# Patient Record
Sex: Male | Born: 1935 | Race: White | Hispanic: No | Marital: Married | Smoking: Former smoker
Health system: Southern US, Community
[De-identification: ages and names within clinical notes are randomized; demographics above are authoritative.]

## PROBLEM LIST (undated history)

## (undated) DIAGNOSIS — E119 Type 2 diabetes mellitus without complications: Secondary | ICD-10-CM

## (undated) DIAGNOSIS — I4891 Unspecified atrial fibrillation: Secondary | ICD-10-CM

## (undated) DIAGNOSIS — G5603 Carpal tunnel syndrome, bilateral upper limbs: Secondary | ICD-10-CM

## (undated) DIAGNOSIS — Z8719 Personal history of other diseases of the digestive system: Secondary | ICD-10-CM

## (undated) DIAGNOSIS — G4733 Obstructive sleep apnea (adult) (pediatric): Secondary | ICD-10-CM

## (undated) DIAGNOSIS — J309 Allergic rhinitis, unspecified: Secondary | ICD-10-CM

## (undated) DIAGNOSIS — R269 Unspecified abnormalities of gait and mobility: Secondary | ICD-10-CM

## (undated) DIAGNOSIS — E669 Obesity, unspecified: Secondary | ICD-10-CM

## (undated) DIAGNOSIS — K8020 Calculus of gallbladder without cholecystitis without obstruction: Secondary | ICD-10-CM

## (undated) DIAGNOSIS — I10 Essential (primary) hypertension: Secondary | ICD-10-CM

## (undated) DIAGNOSIS — G629 Polyneuropathy, unspecified: Secondary | ICD-10-CM

## (undated) DIAGNOSIS — G20 Parkinson's disease: Secondary | ICD-10-CM

## (undated) DIAGNOSIS — E1142 Type 2 diabetes mellitus with diabetic polyneuropathy: Principal | ICD-10-CM

## (undated) DIAGNOSIS — E039 Hypothyroidism, unspecified: Secondary | ICD-10-CM

## (undated) DIAGNOSIS — E1149 Type 2 diabetes mellitus with other diabetic neurological complication: Secondary | ICD-10-CM

## (undated) DIAGNOSIS — K222 Esophageal obstruction: Secondary | ICD-10-CM

## (undated) DIAGNOSIS — C4490 Unspecified malignant neoplasm of skin, unspecified: Secondary | ICD-10-CM

## (undated) HISTORY — PX: CATARACT EXTRACTION: SUR2

## (undated) HISTORY — PX: TONSILLECTOMY: SUR1361

## (undated) HISTORY — DX: Esophageal obstruction: K22.2

## (undated) HISTORY — DX: Type 2 diabetes mellitus with other diabetic neurological complication: E11.49

## (undated) HISTORY — DX: Essential (primary) hypertension: I10

## (undated) HISTORY — DX: Unspecified malignant neoplasm of skin, unspecified: C44.90

## (undated) HISTORY — DX: Type 2 diabetes mellitus without complications: E11.9

## (undated) HISTORY — PX: TOTAL KNEE ARTHROPLASTY: SHX125

## (undated) HISTORY — DX: Obstructive sleep apnea (adult) (pediatric): G47.33

## (undated) HISTORY — DX: Parkinson's disease: G20

## (undated) HISTORY — DX: Type 2 diabetes mellitus with diabetic polyneuropathy: E11.42

## (undated) HISTORY — DX: Unspecified abnormalities of gait and mobility: R26.9

## (undated) HISTORY — PX: OTHER SURGICAL HISTORY: SHX169

## (undated) HISTORY — DX: Allergic rhinitis, unspecified: J30.9

---

## 1997-11-10 ENCOUNTER — Ambulatory Visit (HOSPITAL_COMMUNITY): Admission: RE | Admit: 1997-11-10 | Discharge: 1997-11-10 | Payer: Self-pay | Admitting: Family Medicine

## 1997-11-17 ENCOUNTER — Ambulatory Visit (HOSPITAL_COMMUNITY): Admission: RE | Admit: 1997-11-17 | Discharge: 1997-11-17 | Payer: Self-pay | Admitting: Cardiology

## 1998-02-09 ENCOUNTER — Encounter: Admission: RE | Admit: 1998-02-09 | Discharge: 1998-02-09 | Payer: Self-pay | Admitting: Infectious Diseases

## 1998-02-24 ENCOUNTER — Encounter: Admission: RE | Admit: 1998-02-24 | Discharge: 1998-02-24 | Payer: Self-pay | Admitting: Infectious Diseases

## 1998-03-17 ENCOUNTER — Encounter: Admission: RE | Admit: 1998-03-17 | Discharge: 1998-03-17 | Payer: Self-pay | Admitting: Infectious Diseases

## 1998-04-27 ENCOUNTER — Encounter: Admission: RE | Admit: 1998-04-27 | Discharge: 1998-04-27 | Payer: Self-pay | Admitting: Infectious Diseases

## 1998-04-27 ENCOUNTER — Ambulatory Visit (HOSPITAL_COMMUNITY): Admission: RE | Admit: 1998-04-27 | Discharge: 1998-04-27 | Payer: Self-pay | Admitting: Infectious Diseases

## 1998-06-21 ENCOUNTER — Encounter: Admission: RE | Admit: 1998-06-21 | Discharge: 1998-06-21 | Payer: Self-pay | Admitting: Infectious Diseases

## 1998-07-04 ENCOUNTER — Encounter: Admission: RE | Admit: 1998-07-04 | Discharge: 1998-07-04 | Payer: Self-pay | Admitting: Infectious Diseases

## 1998-08-03 ENCOUNTER — Encounter: Admission: RE | Admit: 1998-08-03 | Discharge: 1998-08-03 | Payer: Self-pay | Admitting: Infectious Diseases

## 1998-08-31 ENCOUNTER — Encounter: Admission: RE | Admit: 1998-08-31 | Discharge: 1998-08-31 | Payer: Self-pay | Admitting: Infectious Diseases

## 1998-09-06 ENCOUNTER — Encounter: Payer: Self-pay | Admitting: Emergency Medicine

## 1998-09-06 ENCOUNTER — Inpatient Hospital Stay: Admission: EM | Admit: 1998-09-06 | Discharge: 1998-09-08 | Payer: Self-pay | Admitting: Emergency Medicine

## 1998-09-07 ENCOUNTER — Encounter: Payer: Self-pay | Admitting: Gastroenterology

## 1998-11-11 ENCOUNTER — Ambulatory Visit: Admission: RE | Admit: 1998-11-11 | Discharge: 1998-11-11 | Payer: Self-pay | Admitting: Internal Medicine

## 1999-08-31 ENCOUNTER — Inpatient Hospital Stay (HOSPITAL_COMMUNITY): Admission: RE | Admit: 1999-08-31 | Discharge: 1999-09-04 | Payer: Self-pay | Admitting: Orthopedic Surgery

## 2000-07-17 ENCOUNTER — Ambulatory Visit (HOSPITAL_COMMUNITY): Admission: RE | Admit: 2000-07-17 | Discharge: 2000-07-17 | Payer: Self-pay | Admitting: Gastroenterology

## 2000-07-17 ENCOUNTER — Encounter (INDEPENDENT_AMBULATORY_CARE_PROVIDER_SITE_OTHER): Payer: Self-pay | Admitting: Specialist

## 2000-12-24 ENCOUNTER — Ambulatory Visit (HOSPITAL_COMMUNITY): Admission: RE | Admit: 2000-12-24 | Discharge: 2000-12-24 | Payer: Self-pay | Admitting: Family Medicine

## 2003-09-15 ENCOUNTER — Ambulatory Visit (HOSPITAL_BASED_OUTPATIENT_CLINIC_OR_DEPARTMENT_OTHER): Admission: RE | Admit: 2003-09-15 | Discharge: 2003-09-15 | Payer: Self-pay | Admitting: Internal Medicine

## 2005-03-13 ENCOUNTER — Ambulatory Visit: Payer: Self-pay | Admitting: Internal Medicine

## 2006-04-02 ENCOUNTER — Ambulatory Visit: Payer: Self-pay | Admitting: Internal Medicine

## 2006-06-17 ENCOUNTER — Encounter: Admission: RE | Admit: 2006-06-17 | Discharge: 2006-09-15 | Payer: Self-pay | Admitting: Family Medicine

## 2006-07-24 ENCOUNTER — Encounter: Admission: RE | Admit: 2006-07-24 | Discharge: 2006-10-22 | Payer: Self-pay | Admitting: Family Medicine

## 2006-12-11 ENCOUNTER — Encounter: Admission: RE | Admit: 2006-12-11 | Discharge: 2006-12-11 | Payer: Self-pay | Admitting: Family Medicine

## 2007-04-01 ENCOUNTER — Ambulatory Visit: Payer: Self-pay | Admitting: Internal Medicine

## 2008-03-29 DIAGNOSIS — E1149 Type 2 diabetes mellitus with other diabetic neurological complication: Secondary | ICD-10-CM | POA: Insufficient documentation

## 2008-03-29 DIAGNOSIS — E119 Type 2 diabetes mellitus without complications: Secondary | ICD-10-CM

## 2008-03-29 DIAGNOSIS — G4733 Obstructive sleep apnea (adult) (pediatric): Secondary | ICD-10-CM

## 2008-03-29 DIAGNOSIS — I10 Essential (primary) hypertension: Secondary | ICD-10-CM | POA: Insufficient documentation

## 2008-03-29 DIAGNOSIS — J309 Allergic rhinitis, unspecified: Secondary | ICD-10-CM | POA: Insufficient documentation

## 2008-03-30 ENCOUNTER — Ambulatory Visit: Payer: Self-pay | Admitting: Internal Medicine

## 2008-07-29 ENCOUNTER — Telehealth: Payer: Self-pay | Admitting: Internal Medicine

## 2009-03-30 ENCOUNTER — Ambulatory Visit: Payer: Self-pay | Admitting: Internal Medicine

## 2009-06-05 ENCOUNTER — Telehealth: Payer: Self-pay | Admitting: Internal Medicine

## 2009-06-05 ENCOUNTER — Encounter: Payer: Self-pay | Admitting: Internal Medicine

## 2009-07-20 ENCOUNTER — Encounter: Payer: Self-pay | Admitting: Internal Medicine

## 2010-02-23 ENCOUNTER — Encounter: Payer: Self-pay | Admitting: Internal Medicine

## 2010-03-30 ENCOUNTER — Ambulatory Visit: Payer: Self-pay | Admitting: Internal Medicine
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
## 2010-12-04 NOTE — Assessment & Plan Note (Signed)
[facility]                             PULMONARY OFFICE NOTE   Joshua Glass, Joshua Glass                        MRN:          [id number]  DATE:04/01/2007                            DOB:          06-28-1933    PULMONARY FOLLOW-UP:   PROBLEMS:  1. Obstructive sleep apnea.  2. Hypertension.  3. Allergic rhinitis.  4. Diabetes/neuropathy.   HISTORY:  CPAP 17 CWP is comfortable.  He likes his new humidifier.  Has  been losing weight as part of his diabetes management program and  understands this helps his sleep apnea as well.  Sneezes in winter  dryness, otherwise nasal airway works fairly well.   MEDICATIONS:  1. CPAP 17.  2. Cozaar 100 mg.  3. Omega-3.  4. Prevacid 30 mg every other day  5. Maxzide 75/50 mg x1/2.  6. Potassium 20 mcg x1/2.  7. Baby aspirin.  8. Crestor.   No medication allergy.   OBJECTIVE:  Weight 233 pounds, BP 144/80, pulse 65, room air saturation  98%.  He is obese, cheerful.  No evident distress.  His nasal airway seems clear.  There are no pressure marks from his  mask.  LUNGS:  Clear.  Pulse regular.   IMPRESSION:  Good control of sleep apnea.  He would do better if he  could lose weight, and this was reviewed again.  We do not have changes  to make.  We are scheduling return in one year, earlier p.r.n.     Clinton D. Maple Hudson, MD, [facility], FACP  Electronically Signed   CDY/MedQ  DD: 04/05/2007  DT: 04/06/2007  Job #: [id number]   cc:   [name], M.D.

## 2010-12-07 NOTE — Assessment & Plan Note (Signed)
[facility]                               PULMONARY OFFICE NOTE   DAIWIK, BUFFALO                        MRN:          [id number]  DATE:04/02/2006                            DOB:          February 04, 1933    PROBLEM:  1. Obstructive sleep apnea.  2. Hypertension.  3. Allergic rhinitis.   HISTORY:  This gentleman is now 4 and returns for one-year follow-up of his  sleep apnea. He feels very comfortable and stable on CPAP at 17 CWP.  He  describes using a sleep number bed.  He has had some seasonal sneezing and  mild nasal congestion, which makes a little difference in CPAP comfort.  He  is trying to lose some weight.  They have put an electronic air cleaner in  the bedroom.   MEDICATIONS:  1. CPAP at 17 CWP.  2. Cozaar 100 mg.  3. Prevacid 30 mg every other day.  4. Maxzide 75/50, one-half daily.  5. Potassium 20.  6. Aspirin 81 mg.  7. Metformin 500 mg.   NO MEDICATION ALLERGY.   OBJECTIVE:  VITAL SIGNS:  Over weight at 248 pounds.  Temperature 98.8, BP  156/90, pulse regular 79, room air saturation 97%.  CONSTITUTIONAL:  He is quite alert.  HEENT:  There are no pressure marks on his face from the CPAP.  Turbinates  are somewhat edematous with watery secretion.  Conjunctivae are clear.  Pharynx is clear.  NECK:  There is no stridor or thyromegaly.  LUNGS:  Clear to P and A.  HEART:  Sounds regular without murmur.   IMPRESSION:  1. Obstructive sleep apnea is adequately controlled on CPAP at 17 CWP.  2. Mild seasonal exacerbation of allergic rhinitis.   PLAN:  1. Continue CPAP at 17 CWP.  2. Weight loss.  3. Discussing options, I suggested he try Nasalcrom nasal spray, which is      over-the-counter.  4. Schedule return 1 year, earlier p.r.n.                                   Clinton D. Maple Hudson, MD, [facility], FACP   CDY/MedQ  DD:  04/05/2006  DT:  04/06/2006  Job #:  [id number]   cc:   [name], M.D.

## 2011-01-31 ENCOUNTER — Other Ambulatory Visit: Payer: Self-pay | Admitting: Dermatology

## 2011-03-28 ENCOUNTER — Encounter: Payer: Self-pay | Admitting: Internal Medicine

## 2011-03-29 ENCOUNTER — Ambulatory Visit (INDEPENDENT_AMBULATORY_CARE_PROVIDER_SITE_OTHER): Payer: No Typology Code available for payment source | Admitting: Internal Medicine

## 2011-03-29 ENCOUNTER — Other Ambulatory Visit (INDEPENDENT_AMBULATORY_CARE_PROVIDER_SITE_OTHER): Payer: No Typology Code available for payment source

## 2011-03-29 ENCOUNTER — Encounter: Payer: Self-pay | Admitting: Internal Medicine

## 2011-03-29 VITALS — BP 130/70 | HR 82 | Ht 70.0 in | Wt 255.6 lb

## 2011-03-29 DIAGNOSIS — Z23 Encounter for immunization: Secondary | ICD-10-CM

## 2011-03-29 DIAGNOSIS — G4733 Obstructive sleep apnea (adult) (pediatric): Secondary | ICD-10-CM

## 2011-03-29 DIAGNOSIS — E039 Hypothyroidism, unspecified: Secondary | ICD-10-CM

## 2011-03-29 LAB — TSH: TSH: 2.61 u[IU]/mL (ref 0.35–5.50)

## 2011-03-29 NOTE — Progress Notes (Signed)
Subjective:    Patient ID: Joshua Glass, male    DOB: [birth year], 77 y.o.   MRN: [id number]  HPI 03/29/11- 77 year old male former smoker followed for obstructive sleep apnea, allergic rhinitis, complicated by DM, HBP. Last here -03/30/10- For the last 4-5 months he has felt no energy. He asks if this is age. He uses CPAP all night every night, and sleep quality feels the same to him, but doesn't know if he snores through. Avoids caffeine for heart rhythm. He is no more likely to doze off during the day that he was at last visit when he thought he was going well..  Denies heart or known thyroid problems. To see his PCP, Dr Marina Goodell, in October.  Flu vax talk.  Review of Systems Constitutional:   No-   weight loss, night sweats, fevers, chills,  + fatigue, lassitude. HEENT:   No-  headaches, difficulty swallowing, tooth/dental problems, sore throat,       No-  sneezing, itching, ear ache, nasal congestion, post nasal drip,  CV:  No-   chest pain, orthopnea, PND, swelling in lower extremities, anasarca, dizziness, palpitations Resp: No-   shortness of breath with exertion or at rest.              No-   productive cough,  No non-productive cough,  No-  coughing up of blood.              No-   change in color of mucus.  No- wheezing.   Skin: No-   rash or lesions. GI:  No-   heartburn, indigestion, abdominal pain, nausea, vomiting, diarrhea,                 change in bowel habits, loss of appetite GU: No-   dysuria, change in color of urine, no urgency or frequency.  No- flank pain. MS:  No-   joint pain or swelling.  No- decreased range of motion.  No- back pain. Neuro- grossly normal to observation, Or:  Psych:  No- change in mood or affect. No depression or anxiety.  No memory loss.      Objective:   Physical Exam General- Alert, Oriented, Affect-appropriate, Distress- none acute  Obese, passive  Skin- rash-none, lesions- none, excoriation- none Lymphadenopathy- none Head- atraumatic       Eyes- Gross vision intact, PERRLA, conjunctivae clear secretions            Ears- Hearing, canals normal            Nose- Clear, No- Septal dev, mucus, polyps, erosion, perforation             Throat- Mallampati III , mucosa clear , drainage- none, tonsils- atrophic Neck- flexible , trachea midline, no stridor , thyroid nl, carotid no bruit Chest - symmetrical excursion , unlabored           Heart/CV- RRR , no murmur , no gallop  , no rub, nl s1 s2                           - JVD- none , edema- none, stasis changes- none, varices- none           Lung- clear to P&A, wheeze- none, cough- none , dullness-none, rub- none           Chest wall-  Abd- tender-no, distended-no, bowel sounds-present, HSM- no Br/ Gen/ Rectal- Not done, not indicated Extrem- cyanosis- none, clubbing,  none, atrophy- none, strength- nl Neuro- grossly intact to observation         Assessment & Plan:

## 2011-03-29 NOTE — Patient Instructions (Signed)
Order- [facility]-  Increase CPAP to 19              Lab-   TSH    Dx hypothyroid               Flu vax

## 2011-03-29 NOTE — Assessment & Plan Note (Signed)
Complains of lack of energy, which may not be sleepiness.  Plan- try [facility] increase CPAP to 17>19 Check TSH in advance of visit to Dr Marina Goodell

## 2011-04-02 ENCOUNTER — Telehealth: Payer: Self-pay | Admitting: Internal Medicine

## 2011-04-02 NOTE — Telephone Encounter (Signed)
Spoke to [email or web address]  she had the office call pt and set his cpap on 19cm today before he goes o-u-t

## 2011-04-09 ENCOUNTER — Telehealth: Payer: Self-pay | Admitting: Internal Medicine

## 2011-04-09 NOTE — Telephone Encounter (Signed)
Pt requesting thyroid results. Results provided. Nothing further needed. Carron Curie, CMA

## 2011-08-06 ENCOUNTER — Telehealth: Payer: Self-pay | Admitting: Internal Medicine

## 2011-08-06 DIAGNOSIS — G4733 Obstructive sleep apnea (adult) (pediatric): Secondary | ICD-10-CM

## 2011-08-06 NOTE — Telephone Encounter (Signed)
Called and spoke with pt. He states feels that the pressure set on 19 is too high. He states that he is not able to get much sleep with this and feels that he is smothering. States that he has tried several masks with the new pressure and all of them cause air to leak, and he has to have the mask on so tight that it has caused a sore on his nose. He would like to know if okay with CDY to have to pressure lowered back down to 17. Pt aware CDY out of the office again until tomorrow. States okay to wait until then. Please advise, thanks!

## 2011-08-07 NOTE — Telephone Encounter (Signed)
Per CY-okay to put order in for reduce CPAP to 17 through his DME company

## 2011-08-07 NOTE — Telephone Encounter (Signed)
Addended by: Reynaldo Minium C on: 08/07/2011 11:47 AM   Modules accepted: Orders

## 2011-08-07 NOTE — Telephone Encounter (Signed)
I have placed order

## 2011-08-08 ENCOUNTER — Telehealth: Payer: Self-pay | Admitting: Internal Medicine

## 2011-08-08 NOTE — Telephone Encounter (Signed)
kayla w/ [facility] called to get an order to increase cpap pressure to 17. Her # is [phone]. Hazel Sams

## 2011-08-08 NOTE — Telephone Encounter (Signed)
Spoke to [email or web address]  this was taken care of today

## 2011-08-08 NOTE — Telephone Encounter (Signed)
Spoke with Joshua Glass at [facility] and she is going to have someone in resp dept to call back.  Informed her that order to decrease CPAP pressure was sent over yesterday.  She will have someone call back to verify that order was received.

## 2011-10-05 ENCOUNTER — Emergency Department (HOSPITAL_COMMUNITY): Payer: Medicare Other

## 2011-10-05 ENCOUNTER — Other Ambulatory Visit: Payer: Self-pay

## 2011-10-05 ENCOUNTER — Encounter (HOSPITAL_COMMUNITY): Payer: Self-pay | Admitting: Physical Medicine and Rehabilitation

## 2011-10-05 ENCOUNTER — Inpatient Hospital Stay (HOSPITAL_COMMUNITY)
Admission: EM | Admit: 2011-10-05 | Discharge: 2011-10-09 | DRG: 086 | Disposition: A | Discharge status: Rehabilitation Facility | Payer: Medicare Other | Attending: Family Medicine | Admitting: Family Medicine

## 2011-10-05 DIAGNOSIS — W19XXXA Unspecified fall, initial encounter: Secondary | ICD-10-CM

## 2011-10-05 DIAGNOSIS — I10 Essential (primary) hypertension: Secondary | ICD-10-CM | POA: Diagnosis present

## 2011-10-05 DIAGNOSIS — S0990XA Unspecified injury of head, initial encounter: Secondary | ICD-10-CM

## 2011-10-05 DIAGNOSIS — M5030 Other cervical disc degeneration, unspecified cervical region: Secondary | ICD-10-CM | POA: Diagnosis present

## 2011-10-05 DIAGNOSIS — G909 Disorder of the autonomic nervous system, unspecified: Secondary | ICD-10-CM | POA: Diagnosis present

## 2011-10-05 DIAGNOSIS — Z7982 Long term (current) use of aspirin: Secondary | ICD-10-CM

## 2011-10-05 DIAGNOSIS — E119 Type 2 diabetes mellitus without complications: Secondary | ICD-10-CM

## 2011-10-05 DIAGNOSIS — R55 Syncope and collapse: Secondary | ICD-10-CM | POA: Diagnosis present

## 2011-10-05 DIAGNOSIS — J309 Allergic rhinitis, unspecified: Secondary | ICD-10-CM | POA: Diagnosis present

## 2011-10-05 DIAGNOSIS — G4733 Obstructive sleep apnea (adult) (pediatric): Secondary | ICD-10-CM | POA: Diagnosis present

## 2011-10-05 DIAGNOSIS — S0003XA Contusion of scalp, initial encounter: Secondary | ICD-10-CM | POA: Diagnosis present

## 2011-10-05 DIAGNOSIS — S065XAA Traumatic subdural hemorrhage with loss of consciousness status unknown, initial encounter: Principal | ICD-10-CM | POA: Diagnosis present

## 2011-10-05 DIAGNOSIS — E1149 Type 2 diabetes mellitus with other diabetic neurological complication: Secondary | ICD-10-CM | POA: Diagnosis present

## 2011-10-05 DIAGNOSIS — Y92009 Unspecified place in unspecified non-institutional (private) residence as the place of occurrence of the external cause: Secondary | ICD-10-CM

## 2011-10-05 DIAGNOSIS — Z794 Long term (current) use of insulin: Secondary | ICD-10-CM

## 2011-10-05 DIAGNOSIS — Z79899 Other long term (current) drug therapy: Secondary | ICD-10-CM

## 2011-10-05 DIAGNOSIS — S0083XA Contusion of other part of head, initial encounter: Secondary | ICD-10-CM | POA: Diagnosis present

## 2011-10-05 DIAGNOSIS — M25519 Pain in unspecified shoulder: Secondary | ICD-10-CM | POA: Diagnosis present

## 2011-10-05 DIAGNOSIS — S065X9A Traumatic subdural hemorrhage with loss of consciousness of unspecified duration, initial encounter: Secondary | ICD-10-CM

## 2011-10-05 HISTORY — DX: Carpal tunnel syndrome, bilateral upper limbs: G56.03

## 2011-10-05 HISTORY — DX: Obesity, unspecified: E66.9

## 2011-10-05 HISTORY — DX: Polyneuropathy, unspecified: G62.9

## 2011-10-05 LAB — CBC
HCT: 41.3 % (ref 39.0–52.0)
Hemoglobin: 14.6 g/dL (ref 13.0–17.0)
MCH: 30 pg (ref 26.0–34.0)
MCHC: 35.4 g/dL (ref 30.0–36.0)
MCV: 85 fL (ref 78.0–100.0)
RDW: 14.2 % (ref 11.5–15.5)

## 2011-10-05 LAB — POCT I-STAT, CHEM 8
BUN: 13 mg/dL (ref 6–23)
Creatinine, Ser: 1 mg/dL (ref 0.50–1.35)
Glucose, Bld: 244 mg/dL — ABNORMAL HIGH (ref 70–99)
Sodium: 138 mEq/L (ref 135–145)
TCO2: 24 mmol/L (ref 0–100)

## 2011-10-05 LAB — URINALYSIS, ROUTINE W REFLEX MICROSCOPIC
Bilirubin Urine: NEGATIVE
Hgb urine dipstick: NEGATIVE
Ketones, ur: NEGATIVE mg/dL
Protein, ur: NEGATIVE mg/dL
Specific Gravity, Urine: 1.014 (ref 1.005–1.030)
Urobilinogen, UA: 1 mg/dL (ref 0.0–1.0)

## 2011-10-05 LAB — DIFFERENTIAL
Basophils Relative: 1 % (ref 0–1)
Eosinophils Absolute: 0.3 10*3/uL (ref 0.0–0.7)
Eosinophils Relative: 3 % (ref 0–5)
Monocytes Absolute: 0.9 10*3/uL (ref 0.1–1.0)
Monocytes Relative: 7 % (ref 3–12)

## 2011-10-05 LAB — HEMOGLOBIN A1C
Hgb A1c MFr Bld: 8.3 % — ABNORMAL HIGH (ref <5.7)
Mean Plasma Glucose: 192 mg/dL — ABNORMAL HIGH (ref <117)

## 2011-10-05 MED ORDER — LOSARTAN POTASSIUM 50 MG PO TABS
50.0000 mg | ORAL_TABLET | Freq: Two times a day (BID) | ORAL | Status: DC
  Administered 2011-10-05 – 2011-10-09 (×8): 50 mg via ORAL
  Filled 2011-10-05 (×9): qty 1

## 2011-10-05 MED ORDER — DOCUSATE SODIUM 100 MG PO CAPS
100.0000 mg | ORAL_CAPSULE | Freq: Two times a day (BID) | ORAL | Status: DC
  Administered 2011-10-06 – 2011-10-09 (×7): 100 mg via ORAL
  Filled 2011-10-05 (×9): qty 1

## 2011-10-05 MED ORDER — ONDANSETRON HCL 4 MG/2ML IJ SOLN
4.0000 mg | Freq: Once | INTRAMUSCULAR | Status: AC
  Administered 2011-10-05: 4 mg via INTRAVENOUS
  Filled 2011-10-05: qty 2

## 2011-10-05 MED ORDER — ACETAMINOPHEN 650 MG RE SUPP: 650.0000 mg | Freq: Four times a day (QID) | RECTAL | Status: DC | PRN

## 2011-10-05 MED ORDER — SENNOSIDES-DOCUSATE SODIUM 8.6-50 MG PO TABS
1.0000 | ORAL_TABLET | Freq: Every evening | ORAL | Status: DC | PRN
  Administered 2011-10-05: 1 via ORAL
  Filled 2011-10-05: qty 1

## 2011-10-05 MED ORDER — METOPROLOL TARTRATE 1 MG/ML IV SOLN: 5.0000 mg | Freq: Four times a day (QID) | INTRAVENOUS | Status: DC | PRN

## 2011-10-05 MED ORDER — OXYCODONE HCL 5 MG PO TABS
5.0000 mg | ORAL_TABLET | ORAL | Status: DC | PRN
  Administered 2011-10-06 – 2011-10-08 (×9): 5 mg via ORAL
  Filled 2011-10-05 (×10): qty 1

## 2011-10-05 MED ORDER — ACETAMINOPHEN 325 MG PO TABS
650.0000 mg | ORAL_TABLET | Freq: Four times a day (QID) | ORAL | Status: DC | PRN
  Administered 2011-10-05 – 2011-10-08 (×9): 650 mg via ORAL
  Filled 2011-10-05 (×9): qty 2

## 2011-10-05 MED ORDER — SODIUM CHLORIDE 0.9 % IV SOLN: INTRAVENOUS | Status: DC

## 2011-10-05 MED ORDER — ONDANSETRON HCL 4 MG PO TABS: 4.0000 mg | ORAL_TABLET | Freq: Four times a day (QID) | ORAL | Status: DC | PRN

## 2011-10-05 MED ORDER — PANTOPRAZOLE SODIUM 40 MG PO TBEC
40.0000 mg | DELAYED_RELEASE_TABLET | Freq: Every day | ORAL | Status: DC
  Administered 2011-10-06 – 2011-10-09 (×4): 40 mg via ORAL
  Filled 2011-10-05 (×4): qty 1

## 2011-10-05 MED ORDER — MORPHINE SULFATE 2 MG/ML IJ SOLN
2.0000 mg | INTRAMUSCULAR | Status: DC | PRN
  Administered 2011-10-06 (×2): 1 mg via INTRAVENOUS
  Filled 2011-10-05 (×2): qty 1

## 2011-10-05 MED ORDER — INSULIN ASPART 100 UNIT/ML ~~LOC~~ SOLN
4.0000 [IU] | Freq: Three times a day (TID) | SUBCUTANEOUS | Status: DC
  Administered 2011-10-06 – 2011-10-09 (×11): 4 [IU] via SUBCUTANEOUS

## 2011-10-05 MED ORDER — INSULIN ASPART 100 UNIT/ML ~~LOC~~ SOLN
0.0000 [IU] | Freq: Every day | SUBCUTANEOUS | Status: DC
  Administered 2011-10-05 – 2011-10-08 (×4): 2 [IU] via SUBCUTANEOUS

## 2011-10-05 MED ORDER — ALUM & MAG HYDROXIDE-SIMETH 200-200-20 MG/5ML PO SUSP: 30.0000 mL | Freq: Four times a day (QID) | ORAL | Status: DC | PRN

## 2011-10-05 MED ORDER — SODIUM CHLORIDE 0.9 % IJ SOLN
3.0000 mL | Freq: Two times a day (BID) | INTRAMUSCULAR | Status: DC
  Administered 2011-10-05 – 2011-10-09 (×6): 3 mL via INTRAVENOUS

## 2011-10-05 MED ORDER — INSULIN GLARGINE 100 UNIT/ML ~~LOC~~ SOLN: 20.0000 [IU] | Freq: Every day | SUBCUTANEOUS | Status: DC

## 2011-10-05 MED ORDER — ONDANSETRON HCL 4 MG/2ML IJ SOLN: 4.0000 mg | Freq: Four times a day (QID) | INTRAMUSCULAR | Status: DC | PRN

## 2011-10-05 MED ORDER — INSULIN ASPART 100 UNIT/ML ~~LOC~~ SOLN
0.0000 [IU] | Freq: Three times a day (TID) | SUBCUTANEOUS | Status: DC
  Administered 2011-10-06 – 2011-10-07 (×4): 3 [IU] via SUBCUTANEOUS
  Administered 2011-10-07: 5 [IU] via SUBCUTANEOUS
  Administered 2011-10-07: 3 [IU] via SUBCUTANEOUS
  Administered 2011-10-08: 2 [IU] via SUBCUTANEOUS
  Administered 2011-10-08 (×2): 3 [IU] via SUBCUTANEOUS
  Administered 2011-10-09: 2 [IU] via SUBCUTANEOUS
  Administered 2011-10-09: 5 [IU] via SUBCUTANEOUS

## 2011-10-05 MED ORDER — SODIUM CHLORIDE 0.9 % IV SOLN: INTRAVENOUS | Status: AC

## 2011-10-05 NOTE — ED Notes (Signed)
Report given to Zina, Charity fundraiser. Pt moved to exam room 27.

## 2011-10-05 NOTE — ED Notes (Signed)
Pt presents to department via [facility] for evaluation of syncopal episode and fall. Was found by wife this morning laying on floor. States he fell down several concrete stairs. Contusion to posterior scalp. No obvious deformities noted. History of neuropathy per wife, states he has trouble walking sometimes. c-collar and LSB per EMS. He is conscious alert and oriented x4 upon arrival, but states he does not remember falling. CBG 232. 20g R forearm.
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
## 2011-10-05 NOTE — ED Notes (Signed)
c-collar removed per Dr. Rubin Payor. Neurosurgery to consult.

## 2011-10-05 NOTE — ED Notes (Signed)
Admitting at the bedside to consult. Vital signs stable. No signs of distress at the time. Pt remains alert and oriented x4.
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
## 2011-10-05 NOTE — ED Notes (Signed)
Pt resting quietly at the time. No signs of distress. Vital signs stable. Family at bedside.

## 2011-10-05 NOTE — ED Notes (Signed)
Assisted pt with urinal, however pt unable to urinate at this time.  Told pt I would check back with him shortly.
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
## 2011-10-06 ENCOUNTER — Inpatient Hospital Stay (HOSPITAL_COMMUNITY): Payer: Medicare Other

## 2011-10-06 DIAGNOSIS — I369 Nonrheumatic tricuspid valve disorder, unspecified: Secondary | ICD-10-CM

## 2011-10-06 LAB — BASIC METABOLIC PANEL
BUN: 13 mg/dL (ref 6–23)
CO2: 26 mEq/L (ref 19–32)
Chloride: 98 mEq/L (ref 96–112)
Creatinine, Ser: 0.89 mg/dL (ref 0.50–1.35)
Glucose, Bld: 207 mg/dL — ABNORMAL HIGH (ref 70–99)

## 2011-10-06 LAB — CBC
HCT: 38.4 % — ABNORMAL LOW (ref 39.0–52.0)
MCHC: 34.1 g/dL (ref 30.0–36.0)
MCV: 85 fL (ref 78.0–100.0)
RDW: 14.1 % (ref 11.5–15.5)
WBC: 11.7 10*3/uL — ABNORMAL HIGH (ref 4.0–10.5)

## 2011-10-06 LAB — GLUCOSE, CAPILLARY
Glucose-Capillary: 180 mg/dL — ABNORMAL HIGH (ref 70–99)
Glucose-Capillary: 173 mg/dL — ABNORMAL HIGH (ref 70–99)

## 2011-10-06 MED ORDER — INSULIN DETEMIR 100 UNIT/ML ~~LOC~~ SOLN
25.0000 [IU] | Freq: Every day | SUBCUTANEOUS | Status: DC
  Administered 2011-10-06 – 2011-10-09 (×4): 25 [IU] via SUBCUTANEOUS
  Filled 2011-10-06: qty 3

## 2011-10-06 NOTE — Progress Notes (Signed)
  Echocardiogram 2D Echocardiogram has been performed.  Joshua Glass 10/06/2011, 5:20 PM
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
## 2011-10-06 NOTE — Progress Notes (Signed)
Subjective: "Sore all over" -Neck pain  Objective: Vital signs in last 24 hours: Temp:  [97.2 F (36.2 C)-98.4 F (36.9 C)] 98.4 F (36.9 C) (03/17 1415) Pulse Rate:  [69-95] 73  (03/17 1415) Resp:  [18-20] 18  (03/17 1415) BP: (121-196)/(69-93) 174/81 mmHg (03/17 1415) SpO2:  [94 %-97 %] 94 % (03/17 1415) FiO2 (%):  [2 %] 2 % (03/17 0600) Weight change:  Last BM Date: 10/05/11  Intake/Output from previous day: 03/16 0701 - 03/17 0700 In: 1370 [P.O.:120; I.V.:1250] Out: -  Total I/O In: 240 [P.O.:240] Out: -    Physical Exam: General: Alert, awake, oriented x3, in no acute distress. HEENT: No bruits, no goiter. Heart: Regular rate and rhythm, without murmurs, rubs, gallops. Lungs: Clear to auscultation bilaterally. Abdomen: Soft, nontender, nondistended, positive bowel sounds. Extremities: No clubbing cyanosis or edema with positive pedal pulses. Neuro: Grossly intact, nonfocal.  Lab Results: Basic Metabolic Panel:  Basename 10/06/11 0644 10/05/11 1737 10/05/11 1104  NA 135 -- 138  K 3.5 -- 3.4*  CL 98 -- 100  CO2 26 -- --  GLUCOSE 207* -- 244*  BUN 13 -- 13  CREATININE 0.89 -- 1.00  CALCIUM 8.9 -- --  MG -- 1.7 --  PHOS -- -- --   CBC:  Basename 10/06/11 0644 10/05/11 1104 10/05/11 1045  WBC 11.7* -- 13.0*  NEUTROABS -- -- 7.9*  HGB 13.1 15.0 --  HCT 38.4* 44.0 --  MCV 85.0 -- 85.0  PLT 259 -- 253   Cardiac Enzymes:  Basename 10/05/11 1045  CKTOTAL --  CKMB --  CKMBINDEX --  TROPONINI <0.30   CBG:  Basename 10/06/11 1634 10/06/11 1156 10/06/11 0747 10/05/11 2035 10/05/11 1634  GLUCAP 180* 173* 180* 224* 190*   Hemoglobin A1C:  Basename 10/05/11 1424  HGBA1C 8.3*    Basename 10/05/11 1737  TSH 1.079  T4TOTAL --  FREET4 --  T3FREE --  THYROIDAB --   Coagulation:  Basename 10/05/11 1737  LABPROT 13.9  INR 1.05    Basename 10/05/11 1321  COLORURINE YELLOW  LABSPEC 1.014  PHURINE 6.0  GLUCOSEU 250*  HGBUR NEGATIVE    BILIRUBINUR NEGATIVE  KETONESUR NEGATIVE  PROTEINUR NEGATIVE  UROBILINOGEN 1.0  NITRITE NEGATIVE  LEUKOCYTESUR NEGATIVE    No results found for this or any previous visit (from the past 240 hour(s)).  Studies/Results: Dg Cervical Spine With Flex & Extend  10/06/2011  *RADIOLOGY REPORT*  Clinical Data: Neck pain after fall.  CERVICAL SPINE COMPLETE WITH FLEXION AND EXTENSION VIEWS  Comparison: MRI cervical spine 10/06/2011.  CT cervical spine 10/05/2011.  Findings:  The cervical spine is visualized on the lateral upright view from skull base through C5-6.  There is no abnormal motion through a limited range of flexion and extension.  Advanced uncovertebral spurring is present throughout the cervical spine as previously described.  There is straightening and some reversal of the normal cervical lordosis, similar to the prior studies.  IMPRESSION:  1.  No abnormal motion from skull base through C5 through limited through a limited range of flexion and extension. 2.  Stable advanced spondylosis as previously described.  Original Report Authenticated By: Jamesetta Orleans. MATTERN, M.D.   Ct Head Wo Contrast  10/06/2011  *RADIOLOGY REPORT*  Clinical Data: Left subdural hematoma.  CT HEAD WITHOUT CONTRAST  Technique:  Contiguous axial images were obtained from the base of the skull through the vertex without contrast.  Comparison: CT head without contrast 10/05/2011.  Findings: Left sided subdural blood  adjacent to the falx is not significantly changed.  Blood extends onto the left tentorium as before.  There is no significant mass effect.  No acute cortical infarct, parenchymal hemorrhage, or mass lesion is present.  Atrophy and white matter disease is otherwise stable. The ventricles are proportionate to the degree of atrophy.  No other significant extra-axial fluid collection is present.  Mild mucosal thickening is present in the ethmoid air cells bilaterally. The paranasal sinuses and mastoid air cells  are clear.  A high left parietal scalp hematoma is similar to the prior exam.  IMPRESSION:  1.  Stable appearance of a left subdural hematoma as described. 2.  Stable atrophy and white matter disease. 3.  A similar appearance of a high left parietal scalp hematoma without underlying fracture.  Original Report Authenticated By: Jamesetta Orleans. MATTERN, M.D.   Ct Head Wo Contrast  10/05/2011  The *RADIOLOGY REPORT*  Clinical Data:  Fall, syncope  CT HEAD WITHOUT CONTRAST CT CERVICAL SPINE WITHOUT CONTRAST  Technique:  Multidetector CT imaging of the head and cervical spine was performed following the standard protocol without intravenous contrast.  Multiplanar CT image reconstructions of the cervical spine were also generated.  Comparison:   None  CT HEAD  Findings: No skull fracture is noted. There is mucosal thickening with partial opacification inferior aspect of the right frontal sinus and bilateral ethmoid air cells.  The atherosclerotic calcifications of carotid siphon are noted.  There is  subdural hemorrhage along the midline interhemispheric fissure measuring 8 mm maximum thickness.  There is no significant mass effect or midline shift.  Small amount of hemorrhage is also noted along the left tentorium in axial image 11.  No acute infarction.  Mild cerebral atrophy.  There is scalp swelling and small subcutaneous hematoma midline posterior parietal region high convexity.  IMPRESSION: There is  subdural hemorrhage along the midline interhemispheric fissure measuring 8 mm maximum thickness.  There is no significant mass effect or midline shift.  Small amount of hemorrhage is also noted along the left tentorium in axial image 11.  No acute infarction.  Mild cerebral atrophy.  There is scalp swelling and small subcutaneous hematoma midline posterior parietal region high convexity.  CT CERVICAL SPINE  Findings: Axial images of the cervical spine shows no acute fracture or subluxation.  Computer processed images  shows extensive disc space flattening with large anterior osteophytes at C3-C4 C4-C5 C5-C6 and C6-C7 level.  The mild posterior osteophytes are noted at C3-C4 C4-C5 C5- C6 and C6-C7 level.  There is mild about 2 mm anterior subluxation C2 on C3 vertebral body probable chronic in nature.  No prevertebral soft tissue swelling.  Cervical airway is patent. Coronal images shows atherosclerotic calcifications bilateral carotid bifurcation left greater than right.  IMPRESSION:  1.  No acute fracture or subluxation.  Probable chronic mild about to 2 mm anterior subluxation C2 on C3 vertebral body.  Extensive degenerative changes as described above.  Critical findings were discussed immediately with Dr. Rubin Payor from emergency room.  Original Report Authenticated By: Natasha Mead, M.D.   Ct Cervical Spine Wo Contrast  10/05/2011  The *RADIOLOGY REPORT*  Clinical Data:  Fall, syncope  CT HEAD WITHOUT CONTRAST CT CERVICAL SPINE WITHOUT CONTRAST  Technique:  Multidetector CT imaging of the head and cervical spine was performed following the standard protocol without intravenous contrast.  Multiplanar CT image reconstructions of the cervical spine were also generated.  Comparison:   None  CT HEAD  Findings: No skull  fracture is noted. There is mucosal thickening with partial opacification inferior aspect of the right frontal sinus and bilateral ethmoid air cells.  The atherosclerotic calcifications of carotid siphon are noted.  There is  subdural hemorrhage along the midline interhemispheric fissure measuring 8 mm maximum thickness.  There is no significant mass effect or midline shift.  Small amount of hemorrhage is also noted along the left tentorium in axial image 11.  No acute infarction.  Mild cerebral atrophy.  There is scalp swelling and small subcutaneous hematoma midline posterior parietal region high convexity.  IMPRESSION: There is  subdural hemorrhage along the midline interhemispheric fissure measuring 8 mm maximum  thickness.  There is no significant mass effect or midline shift.  Small amount of hemorrhage is also noted along the left tentorium in axial image 11.  No acute infarction.  Mild cerebral atrophy.  There is scalp swelling and small subcutaneous hematoma midline posterior parietal region high convexity.  CT CERVICAL SPINE  Findings: Axial images of the cervical spine shows no acute fracture or subluxation.  Computer processed images shows extensive disc space flattening with large anterior osteophytes at C3-C4 C4-C5 C5-C6 and C6-C7 level.  The mild posterior osteophytes are noted at C3-C4 C4-C5 C5- C6 and C6-C7 level.  There is mild about 2 mm anterior subluxation C2 on C3 vertebral body probable chronic in nature.  No prevertebral soft tissue swelling.  Cervical airway is patent. Coronal images shows atherosclerotic calcifications bilateral carotid bifurcation left greater than right.  IMPRESSION:  1.  No acute fracture or subluxation.  Probable chronic mild about to 2 mm anterior subluxation C2 on C3 vertebral body.  Extensive degenerative changes as described above.  Critical findings were discussed immediately with Dr. Rubin Payor from emergency room.  Original Report Authenticated By: Natasha Mead, M.D.   Mr Cervical Spine Wo Contrast  10/06/2011  *RADIOLOGY REPORT*  Clinical Data: Neck pain and right cervical radicular symptoms after a fall.  MRI CERVICAL SPINE WITHOUT CONTRAST  Technique:  Multiplanar and multiecho pulse sequences of the cervical spine, to include the craniocervical junction and cervicothoracic junction, were obtained according to standard protocol without intravenous contrast.  Comparison: CT of the cervical spine 10/05/2011.  Findings: Signal is somewhat limited at the level of the cervical spine cord due to patient body habitus.  Normal signal is present in the cervical upper thoracic spinal cord to the level of T3.  There is straightening and reversal of the normal cervical lordosis at C3-4  through C5-6.  The STIR images demonstrate focal soft tissue signal posterior to the spinous process of C7 and T1.  This likely represents chronic edema within the subcutaneous tissues.  There is no focal edema within the intraspinous ligaments.  No marrow edema is evident to suggest fracture.  C2-3:  Slight degenerative anterolisthesis is present.  Facet hypertrophy is worse on the left.  There is no significant stenosis.  C3-4:  A broad-based disc osteophyte complex is present.  Mild central and bilateral foraminal narrowing is present.  C4-5:  A broad-based disc osteophyte complex is present. Uncovertebral spurring is worse on the left.  Moderate central and mild bilateral foraminal stenosis is present.  C5-6:  A broad-based disc osteophyte complex is present.  Moderate central canal stenosis is present.  The canal is narrowed to 7 mm. Moderate to severe foraminal stenosis is evident bilaterally.  C6-7:  A broad-based disc osteophyte complex is present.  This effaces the ventral CSF.  Uncovertebral disease is evident bilaterally.  Mild central canal stenosis is present.  C7-T1:  Slight anterolisthesis is evident, uncovering the disc. This partially effaces the ventral CSF.  The foramina are patent bilaterally.  IMPRESSION:  1.  No definite acute traumatic injury. 2.  Study limited by lack of signal due to patient body habitus. 3.  Multilevel spondylosis as described. 4.  Central canal stenosis is greatest at C5-6.  5.  Multilevel foraminal stenosis as described.  Original Report Authenticated By: Jamesetta Orleans. MATTERN, M.D.   Dg Chest Port 1 View  10/05/2011  *RADIOLOGY REPORT*  Clinical Data: Syncope.  Former smoker with history of hypertension and diabetes.  PORTABLE CHEST - 1 VIEW 10/05/2011 1059 hours:  Comparison: None.  Findings: Suboptimal inspiration due to body habitus which accounts for crowded bronchovascular markings at the bases and accentuates the cardiac silhouette.  Taking this into account,  cardiac silhouette mildly enlarged and lungs clear.  Pulmonary vascularity normal.  No visible pleural effusions.  IMPRESSION: Suboptimal inspiration.  Mild cardiomegaly.  No acute cardiopulmonary disease.  Original Report Authenticated By: Arnell Sieving, M.D.    Medications: Scheduled Meds:   . docusate sodium  100 mg Oral BID  . insulin aspart  0-15 Units Subcutaneous TID WC  . insulin aspart  0-5 Units Subcutaneous QHS  . insulin aspart  4 Units Subcutaneous TID WC  . insulin detemir  25 Units Subcutaneous Daily  . losartan  50 mg Oral BID  . pantoprazole  40 mg Oral Q1200  . sodium chloride  3 mL Intravenous Q12H  . DISCONTD: insulin glargine  20 Units Subcutaneous QHS   Continuous Infusions:   . sodium chloride     PRN Meds:.acetaminophen, acetaminophen, alum & mag hydroxide-simeth, metoprolol, morphine, ondansetron (ZOFRAN) IV, ondansetron, oxyCODONE, senna-docusate  Assessment/Plan: 78yo gentleman s/p fall with SDH and advanced C-Spine degenerative disease.  1. SDH: Stable, CT unchanged, per NS will need repeat scan in a couple of weeks. Appreciate Dr. Newell Coral following him as an outpatient. Neuro status is stable. 2. C-Spine severe DJD, may be contributing to his unsteadiness and gait problems, Dr. Newell Coral to explore possible surgical options with him as outpatient may be a QOL decsion for him. 3. Possible Syncope, tele has been normal, will check carotid dopplers, labs normal/baseline 4. Dispo: Probably home tomorrow with OP follow up in 1-2 weeks. Will get PT eval for home recs.    LOS: 1 day   [facility] Pager: [phone] 10/06/2011, 5:32 PM

## 2011-10-07 DIAGNOSIS — R55 Syncope and collapse: Secondary | ICD-10-CM

## 2011-10-07 LAB — GLUCOSE, CAPILLARY
Glucose-Capillary: 187 mg/dL — ABNORMAL HIGH (ref 70–99)
Glucose-Capillary: 201 mg/dL — ABNORMAL HIGH (ref 70–99)

## 2011-10-07 NOTE — Progress Notes (Signed)
Utilization Review Completed.Joshua Glass T3/18/2013   
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
## 2011-10-07 NOTE — Progress Notes (Signed)
Subjective: "Sore all over" -Neck Shoulder pain, OOB in chair  Objective: Vital signs in last 24 hours: Temp:  [97.5 F (36.4 C)-98.6 F (37 C)] 98.6 F (37 C) (03/18 0800) Pulse Rate:  [61-82] 82  (03/18 0800) Resp:  [18-20] 18  (03/18 0800) BP: (122-174)/(70-81) 122/76 mmHg (03/18 0800) SpO2:  [93 %-96 %] 93 % (03/18 0800) Weight:  [107.502 kg (237 lb)] 107.502 kg (237 lb) (03/18 0700) Weight change:  Last BM Date: 10/05/11  Intake/Output from previous day: 03/17 0701 - 03/18 0700 In: 240 [P.O.:240] Out: -      Physical Exam: General: Alert, awake, oriented x3, in no acute distress. HEENT: No bruits, no goiter. Heart: Regular rate and rhythm, without murmurs, rubs, gallops. Lungs: Clear to auscultation bilaterally. Abdomen: Soft, nontender, nondistended, positive bowel sounds. Extremities: No clubbing cyanosis or edema with positive pedal pulses. Neuro: Grossly intact, nonfocal.  Lab Results: Basic Metabolic Panel:  Basename 10/06/11 0644 10/05/11 1737 10/05/11 1104  NA 135 -- 138  K 3.5 -- 3.4*  CL 98 -- 100  CO2 26 -- --  GLUCOSE 207* -- 244*  BUN 13 -- 13  CREATININE 0.89 -- 1.00  CALCIUM 8.9 -- --  MG -- 1.7 --  PHOS -- -- --   CBC:  Basename 10/06/11 0644 10/05/11 1104 10/05/11 1045  WBC 11.7* -- 13.0*  NEUTROABS -- -- 7.9*  HGB 13.1 15.0 --  HCT 38.4* 44.0 --  MCV 85.0 -- 85.0  PLT 259 -- 253   Cardiac Enzymes:  Basename 10/05/11 1045  CKTOTAL --  CKMB --  CKMBINDEX --  TROPONINI <0.30   CBG:  Basename 10/07/11 0722 10/06/11 2122 10/06/11 1634 10/06/11 1156 10/06/11 0747 10/05/11 2035  GLUCAP 161* 216* 180* 173* 180* 224*   Hemoglobin A1C:  Basename 10/05/11 1424  HGBA1C 8.3*    Basename 10/05/11 1737  TSH 1.079  T4TOTAL --  FREET4 --  T3FREE --  THYROIDAB --   Coagulation:  Basename 10/05/11 1737  LABPROT 13.9  INR 1.05    Basename 10/05/11 1321  COLORURINE YELLOW  LABSPEC 1.014  PHURINE 6.0  GLUCOSEU 250*  HGBUR  NEGATIVE  BILIRUBINUR NEGATIVE  KETONESUR NEGATIVE  PROTEINUR NEGATIVE  UROBILINOGEN 1.0  NITRITE NEGATIVE  LEUKOCYTESUR NEGATIVE    No results found for this or any previous visit (from the past 240 hour(s)).  Studies/Results: Dg Cervical Spine With Flex & Extend  10/06/2011  *RADIOLOGY REPORT*  Clinical Data: Neck pain after fall.  CERVICAL SPINE COMPLETE WITH FLEXION AND EXTENSION VIEWS  Comparison: MRI cervical spine 10/06/2011.  CT cervical spine 10/05/2011.  Findings:  The cervical spine is visualized on the lateral upright view from skull base through C5-6.  There is no abnormal motion through a limited range of flexion and extension.  Advanced uncovertebral spurring is present throughout the cervical spine as previously described.  There is straightening and some reversal of the normal cervical lordosis, similar to the prior studies.  IMPRESSION:  1.  No abnormal motion from skull base through C5 through limited through a limited range of flexion and extension. 2.  Stable advanced spondylosis as previously described.  Original Report Authenticated By: Jamesetta Orleans. MATTERN, M.D.   Ct Head Wo Contrast  10/06/2011  *RADIOLOGY REPORT*  Clinical Data: Left subdural hematoma.  CT HEAD WITHOUT CONTRAST  Technique:  Contiguous axial images were obtained from the base of the skull through the vertex without contrast.  Comparison: CT head without contrast 10/05/2011.  Findings: Left sided  subdural blood adjacent to the falx is not significantly changed.  Blood extends onto the left tentorium as before.  There is no significant mass effect.  No acute cortical infarct, parenchymal hemorrhage, or mass lesion is present.  Atrophy and white matter disease is otherwise stable. The ventricles are proportionate to the degree of atrophy.  No other significant extra-axial fluid collection is present.  Mild mucosal thickening is present in the ethmoid air cells bilaterally. The paranasal sinuses and mastoid air  cells are clear.  A high left parietal scalp hematoma is similar to the prior exam.  IMPRESSION:  1.  Stable appearance of a left subdural hematoma as described. 2.  Stable atrophy and white matter disease. 3.  A similar appearance of a high left parietal scalp hematoma without underlying fracture.  Original Report Authenticated By: Jamesetta Orleans. MATTERN, M.D.   Ct Head Wo Contrast  10/05/2011  The *RADIOLOGY REPORT*  Clinical Data:  Fall, syncope  CT HEAD WITHOUT CONTRAST CT CERVICAL SPINE WITHOUT CONTRAST  Technique:  Multidetector CT imaging of the head and cervical spine was performed following the standard protocol without intravenous contrast.  Multiplanar CT image reconstructions of the cervical spine were also generated.  Comparison:   None  CT HEAD  Findings: No skull fracture is noted. There is mucosal thickening with partial opacification inferior aspect of the right frontal sinus and bilateral ethmoid air cells.  The atherosclerotic calcifications of carotid siphon are noted.  There is  subdural hemorrhage along the midline interhemispheric fissure measuring 8 mm maximum thickness.  There is no significant mass effect or midline shift.  Small amount of hemorrhage is also noted along the left tentorium in axial image 11.  No acute infarction.  Mild cerebral atrophy.  There is scalp swelling and small subcutaneous hematoma midline posterior parietal region high convexity.  IMPRESSION: There is  subdural hemorrhage along the midline interhemispheric fissure measuring 8 mm maximum thickness.  There is no significant mass effect or midline shift.  Small amount of hemorrhage is also noted along the left tentorium in axial image 11.  No acute infarction.  Mild cerebral atrophy.  There is scalp swelling and small subcutaneous hematoma midline posterior parietal region high convexity.  CT CERVICAL SPINE  Findings: Axial images of the cervical spine shows no acute fracture or subluxation.  Computer processed  images shows extensive disc space flattening with large anterior osteophytes at C3-C4 C4-C5 C5-C6 and C6-C7 level.  The mild posterior osteophytes are noted at C3-C4 C4-C5 C5- C6 and C6-C7 level.  There is mild about 2 mm anterior subluxation C2 on C3 vertebral body probable chronic in nature.  No prevertebral soft tissue swelling.  Cervical airway is patent. Coronal images shows atherosclerotic calcifications bilateral carotid bifurcation left greater than right.  IMPRESSION:  1.  No acute fracture or subluxation.  Probable chronic mild about to 2 mm anterior subluxation C2 on C3 vertebral body.  Extensive degenerative changes as described above.  Critical findings were discussed immediately with Dr. Rubin Payor from emergency room.  Original Report Authenticated By: Natasha Mead, M.D.   Ct Cervical Spine Wo Contrast  10/05/2011  The *RADIOLOGY REPORT*  Clinical Data:  Fall, syncope  CT HEAD WITHOUT CONTRAST CT CERVICAL SPINE WITHOUT CONTRAST  Technique:  Multidetector CT imaging of the head and cervical spine was performed following the standard protocol without intravenous contrast.  Multiplanar CT image reconstructions of the cervical spine were also generated.  Comparison:   None  CT HEAD  Findings:  No skull fracture is noted. There is mucosal thickening with partial opacification inferior aspect of the right frontal sinus and bilateral ethmoid air cells.  The atherosclerotic calcifications of carotid siphon are noted.  There is  subdural hemorrhage along the midline interhemispheric fissure measuring 8 mm maximum thickness.  There is no significant mass effect or midline shift.  Small amount of hemorrhage is also noted along the left tentorium in axial image 11.  No acute infarction.  Mild cerebral atrophy.  There is scalp swelling and small subcutaneous hematoma midline posterior parietal region high convexity.  IMPRESSION: There is  subdural hemorrhage along the midline interhemispheric fissure measuring 8 mm  maximum thickness.  There is no significant mass effect or midline shift.  Small amount of hemorrhage is also noted along the left tentorium in axial image 11.  No acute infarction.  Mild cerebral atrophy.  There is scalp swelling and small subcutaneous hematoma midline posterior parietal region high convexity.  CT CERVICAL SPINE  Findings: Axial images of the cervical spine shows no acute fracture or subluxation.  Computer processed images shows extensive disc space flattening with large anterior osteophytes at C3-C4 C4-C5 C5-C6 and C6-C7 level.  The mild posterior osteophytes are noted at C3-C4 C4-C5 C5- C6 and C6-C7 level.  There is mild about 2 mm anterior subluxation C2 on C3 vertebral body probable chronic in nature.  No prevertebral soft tissue swelling.  Cervical airway is patent. Coronal images shows atherosclerotic calcifications bilateral carotid bifurcation left greater than right.  IMPRESSION:  1.  No acute fracture or subluxation.  Probable chronic mild about to 2 mm anterior subluxation C2 on C3 vertebral body.  Extensive degenerative changes as described above.  Critical findings were discussed immediately with Dr. Rubin Payor from emergency room.  Original Report Authenticated By: Natasha Mead, M.D.   Mr Cervical Spine Wo Contrast  10/06/2011  *RADIOLOGY REPORT*  Clinical Data: Neck pain and right cervical radicular symptoms after a fall.  MRI CERVICAL SPINE WITHOUT CONTRAST  Technique:  Multiplanar and multiecho pulse sequences of the cervical spine, to include the craniocervical junction and cervicothoracic junction, were obtained according to standard protocol without intravenous contrast.  Comparison: CT of the cervical spine 10/05/2011.  Findings: Signal is somewhat limited at the level of the cervical spine cord due to patient body habitus.  Normal signal is present in the cervical upper thoracic spinal cord to the level of T3.  There is straightening and reversal of the normal cervical lordosis  at C3-4 through C5-6.  The STIR images demonstrate focal soft tissue signal posterior to the spinous process of C7 and T1.  This likely represents chronic edema within the subcutaneous tissues.  There is no focal edema within the intraspinous ligaments.  No marrow edema is evident to suggest fracture.  C2-3:  Slight degenerative anterolisthesis is present.  Facet hypertrophy is worse on the left.  There is no significant stenosis.  C3-4:  A broad-based disc osteophyte complex is present.  Mild central and bilateral foraminal narrowing is present.  C4-5:  A broad-based disc osteophyte complex is present. Uncovertebral spurring is worse on the left.  Moderate central and mild bilateral foraminal stenosis is present.  C5-6:  A broad-based disc osteophyte complex is present.  Moderate central canal stenosis is present.  The canal is narrowed to 7 mm. Moderate to severe foraminal stenosis is evident bilaterally.  C6-7:  A broad-based disc osteophyte complex is present.  This effaces the ventral CSF.  Uncovertebral disease is evident  bilaterally.  Mild central canal stenosis is present.  C7-T1:  Slight anterolisthesis is evident, uncovering the disc. This partially effaces the ventral CSF.  The foramina are patent bilaterally.  IMPRESSION:  1.  No definite acute traumatic injury. 2.  Study limited by lack of signal due to patient body habitus. 3.  Multilevel spondylosis as described. 4.  Central canal stenosis is greatest at C5-6.  5.  Multilevel foraminal stenosis as described.  Original Report Authenticated By: Jamesetta Orleans. MATTERN, M.D.    Medications: Scheduled Meds:    . docusate sodium  100 mg Oral BID  . insulin aspart  0-15 Units Subcutaneous TID WC  . insulin aspart  0-5 Units Subcutaneous QHS  . insulin aspart  4 Units Subcutaneous TID WC  . insulin detemir  25 Units Subcutaneous Daily  . losartan  50 mg Oral BID  . pantoprazole  40 mg Oral Q1200  . sodium chloride  3 mL Intravenous Q12H  .  DISCONTD: insulin glargine  20 Units Subcutaneous QHS   Continuous Infusions:  PRN Meds:.acetaminophen, acetaminophen, alum & mag hydroxide-simeth, metoprolol, morphine, ondansetron (ZOFRAN) IV, ondansetron, oxyCODONE, senna-docusate  Assessment/Plan: 78yo gentleman s/p fall with SDH and advanced C-Spine degenerative disease.  1. SDH: Stable, CT unchanged, per NS will need repeat scan in a couple of weeks. Appreciate Dr. Newell Coral following him as an outpatient. Neuro status is stable.  2. C-Spine severe DJD, may be contributing to his unsteadiness and gait problems, Dr. Newell Coral to explore possible surgical options with him as outpatient may be a QOL decsion for him.  -Pain still significant issues for him-he is worried about managing at home with his current level of pain, also seem sedated with meds, high fall risk, will give him another day for recovery.  3. Possible Syncope, tele has been normal, will check carotid dopplers today, labs remain normal/baseline-amnestic to events.  4. Dispo: Home tomorrow with OP follow up in 1-2 weeks. Will get PT eval for home recs.    LOS: 2 days   [facility] Pager: [phone] 10/07/2011, 11:25 AM

## 2011-10-07 NOTE — Progress Notes (Signed)
VASCULAR LAB PRELIMINARY  PRELIMINARY  PRELIMINARY  PRELIMINARY  Carotid duplex completed.    Preliminary report:  Bilateral:  No evidence of hemodynamically significant internal carotid artery stenosis.   Vertebral artery flow not insonated due to anatomy of the neck.Marland Kitchen     Abrar Bilton D, RVS 10/07/2011, 2:24 PM

## 2011-10-07 NOTE — Progress Notes (Signed)
Inpatient Diabetes Program Recommendations  AACE/ADA: New Consensus Statement on Inpatient Glycemic Control (2009)  Target Ranges:  Prepandial:   less than 140 mg/dL      Peak postprandial:   less than 180 mg/dL (1-2 hours)      Critically ill patients:  140 - 180 mg/dL   Results for DEMARQUIS, OSLEY (MRN [id number]) as of 10/07/2011 13:09  Ref. Range 10/06/2011 07:47 10/06/2011 11:56 10/06/2011 16:34 10/06/2011 21:22  Glucose-Capillary Latest Range: 70-99 mg/dL 562 (H) 130 (H) 865 (H) 216 (H)   Home dose Levemir is 36 units daily. Fasting CBGs slightly elevated.  Inpatient Diabetes Program Recommendations Insulin - Basal: Please increase Levemir to 30 units daily.  Will follow. Ambrose Finland RN, MSN, CDE Diabetes Coordinator Inpatient Diabetes Program [phone]

## 2011-10-07 NOTE — Progress Notes (Signed)
Subjective: Patient continues to complain of soreness all over but particularly in the right shoulder. Also complaining of headache. Limited mobility, was seen by PT today, but only ambulated to door of room. He and his wife are concerned about feasibility returning home at this time, particularly in regards to his limited mobility and his recent fall.  Objective: Vital signs in last 24 hours: Filed Vitals:   10/07/11 0700 10/07/11 0800 10/07/11 1200 10/07/11 1600  BP:  122/76 142/74 150/89  Pulse:  82 92 99  Temp:  98.6 F (37 C) 98.5 F (36.9 C) 98.5 F (36.9 C)  TempSrc:   Oral Oral  Resp:  18 16 18   Height: 5\' 8"  (1.727 m)     Weight: 107.502 kg (237 lb)     SpO2:  93% 94% 93%    Intake/Output from previous day: 03/17 0701 - 03/18 0700 In: 240 [P.O.:240] Out: -  Intake/Output this shift:    Physical Exam:  Remains awake, alert, and oriented. Speech remains fluent with good comprehension. Cranial nerves show extra ocular movements are intact, facial movement is symmetrical. Moving all extremities stiffly.  CBC  Basename 10/06/11 0644 10/05/11 1104 10/05/11 1045  WBC 11.7* -- 13.0*  HGB 13.1 15.0 --  HCT 38.4* 44.0 --  PLT 259 -- 253   BMET  Basename 10/06/11 0644 10/05/11 1104  NA 135 138  K 3.5 3.4*  CL 98 100  CO2 26 --  GLUCOSE 207* 244*  BUN 13 13  CREATININE 0.89 1.00  CALCIUM 8.9 --    Studies/Results: Dg Cervical Spine With Flex & Extend  10/06/2011  *RADIOLOGY REPORT*  Clinical Data: Neck pain after fall.  CERVICAL SPINE COMPLETE WITH FLEXION AND EXTENSION VIEWS  Comparison: MRI cervical spine 10/06/2011.  CT cervical spine 10/05/2011.  Findings:  The cervical spine is visualized on the lateral upright view from skull base through C5-6.  There is no abnormal motion through a limited range of flexion and extension.  Advanced uncovertebral spurring is present throughout the cervical spine as previously described.  There is straightening and some reversal  of the normal cervical lordosis, similar to the prior studies.  IMPRESSION:  1.  No abnormal motion from skull base through C5 through limited through a limited range of flexion and extension. 2.  Stable advanced spondylosis as previously described.  Original Report Authenticated By: Jamesetta Orleans. MATTERN, M.D.   Ct Head Wo Contrast  10/06/2011  *RADIOLOGY REPORT*  Clinical Data: Left subdural hematoma.  CT HEAD WITHOUT CONTRAST  Technique:  Contiguous axial images were obtained from the base of the skull through the vertex without contrast.  Comparison: CT head without contrast 10/05/2011.  Findings: Left sided subdural blood adjacent to the falx is not significantly changed.  Blood extends onto the left tentorium as before.  There is no significant mass effect.  No acute cortical infarct, parenchymal hemorrhage, or mass lesion is present.  Atrophy and white matter disease is otherwise stable. The ventricles are proportionate to the degree of atrophy.  No other significant extra-axial fluid collection is present.  Mild mucosal thickening is present in the ethmoid air cells bilaterally. The paranasal sinuses and mastoid air cells are clear.  A high left parietal scalp hematoma is similar to the prior exam.  IMPRESSION:  1.  Stable appearance of a left subdural hematoma as described. 2.  Stable atrophy and white matter disease. 3.  A similar appearance of a high left parietal scalp hematoma without underlying fracture.  Original Report Authenticated By: Jamesetta Orleans. MATTERN, M.D.   Mr Cervical Spine Wo Contrast  10/06/2011  *RADIOLOGY REPORT*  Clinical Data: Neck pain and right cervical radicular symptoms after a fall.  MRI CERVICAL SPINE WITHOUT CONTRAST  Technique:  Multiplanar and multiecho pulse sequences of the cervical spine, to include the craniocervical junction and cervicothoracic junction, were obtained according to standard protocol without intravenous contrast.  Comparison: CT of the cervical  spine 10/05/2011.  Findings: Signal is somewhat limited at the level of the cervical spine cord due to patient body habitus.  Normal signal is present in the cervical upper thoracic spinal cord to the level of T3.  There is straightening and reversal of the normal cervical lordosis at C3-4 through C5-6.  The STIR images demonstrate focal soft tissue signal posterior to the spinous process of C7 and T1.  This likely represents chronic edema within the subcutaneous tissues.  There is no focal edema within the intraspinous ligaments.  No marrow edema is evident to suggest fracture.  C2-3:  Slight degenerative anterolisthesis is present.  Facet hypertrophy is worse on the left.  There is no significant stenosis.  C3-4:  A broad-based disc osteophyte complex is present.  Mild central and bilateral foraminal narrowing is present.  C4-5:  A broad-based disc osteophyte complex is present. Uncovertebral spurring is worse on the left.  Moderate central and mild bilateral foraminal stenosis is present.  C5-6:  A broad-based disc osteophyte complex is present.  Moderate central canal stenosis is present.  The canal is narrowed to 7 mm. Moderate to severe foraminal stenosis is evident bilaterally.  C6-7:  A broad-based disc osteophyte complex is present.  This effaces the ventral CSF.  Uncovertebral disease is evident bilaterally.  Mild central canal stenosis is present.  C7-T1:  Slight anterolisthesis is evident, uncovering the disc. This partially effaces the ventral CSF.  The foramina are patent bilaterally.  IMPRESSION:  1.  No definite acute traumatic injury. 2.  Study limited by lack of signal due to patient body habitus. 3.  Multilevel spondylosis as described. 4.  Central canal stenosis is greatest at C5-6.  5.  Multilevel foraminal stenosis as described.  Original Report Authenticated By: Jamesetta Orleans. MATTERN, M.D.    Assessment/Plan: I reviewed his x-rays and MRI scan of the cervical spine. He has advanced  multilevel cervical spondylosis and degenerative disc disease at essentially all levels of the cervical spine. X-rays do not show instability to flexion extension. X-ray and MRI show a kyphosis of the cervical spine. His MRI scan does show relative stenosis most significantly at the C5-6 level, but to a lesser extent at C3-4, C4-5, and C6-7. No altered cord signal is seen. There is no indication for neurosurgical intervention at this time.  The right shoulder discomfort may be due to local injury to the shoulder or maybe somehow related to the degenerative changes seen in the cervical spine, possibly nerve root contusion.  At this point the patient may well benefit from more extensive rehabilitation, possibly comprehensive inpatient rehabilitation on the [facility] hospital rehabilitation unit, since he did suffer an injury that may have been contributed to by his peripheral neuropathy, and he certainly would benefit from intervention that would help reduce the risk of future falls and injury.   Hewitt Shorts, MD 10/07/2011, 7:42 PM

## 2011-10-08 ENCOUNTER — Encounter (HOSPITAL_COMMUNITY): Payer: Self-pay | Admitting: Physical Medicine and Rehabilitation

## 2011-10-08 DIAGNOSIS — M47812 Spondylosis without myelopathy or radiculopathy, cervical region: Secondary | ICD-10-CM

## 2011-10-08 DIAGNOSIS — M5412 Radiculopathy, cervical region: Secondary | ICD-10-CM

## 2011-10-08 DIAGNOSIS — S065X9A Traumatic subdural hemorrhage with loss of consciousness of unspecified duration, initial encounter: Secondary | ICD-10-CM

## 2011-10-08 DIAGNOSIS — W108XXA Fall (on) (from) other stairs and steps, initial encounter: Secondary | ICD-10-CM

## 2011-10-08 LAB — GLUCOSE, CAPILLARY

## 2011-10-08 MED ORDER — ACETAMINOPHEN 325 MG PO TABS
650.0000 mg | ORAL_TABLET | Freq: Four times a day (QID) | ORAL | Status: DC
  Administered 2011-10-08 – 2011-10-09 (×6): 650 mg via ORAL
  Filled 2011-10-08 (×4): qty 2

## 2011-10-08 MED ORDER — DICLOFENAC EPOLAMINE 1.3 % TD PTCH
1.0000 | MEDICATED_PATCH | Freq: Two times a day (BID) | TRANSDERMAL | Status: DC
  Administered 2011-10-08 – 2011-10-09 (×3): 1 via TRANSDERMAL
  Filled 2011-10-08 (×4): qty 1

## 2011-10-08 NOTE — Progress Notes (Signed)
Physical Therapy Treatment Patient Details Name: Joshua Glass MRN: [id number] DOB: 27-Aug-1932 Today's Date: 10/08/2011  PT Assessment/Plan  PT - Assessment/Plan Comments on Treatment Session: Pt motivated for therapy today, but continues to be limited by decreased motor activation and cognition at this time. Wife present and supportive of pt going to CIR at [place].  PT Plan: Discharge plan needs to be updated PT Frequency: Min 3X/week Follow Up Recommendations: Inpatient Rehab;Supervision/Assistance - 24 hour Equipment Recommended: Defer to next venue PT Goals  Acute Rehab PT Goals PT Goal: Supine/Side to Sit - Progress: Progressing toward goal PT Goal: Sit to Stand - Progress: Progressing toward goal PT Goal: Stand to Sit - Progress: Progressing toward goal PT Goal: Ambulate - Progress: Progressing toward goal  PT Treatment Precautions/Restrictions  Precautions Precautions: Fall Restrictions Weight Bearing Restrictions: No Mobility (including Balance) Bed Mobility Bed Mobility: Yes Supine to Sit: 2: Max assist;With rails;HOB elevated (Comment degrees) (HOB 30 deg) Supine to Sit Details (indicate cue type and reason): Increased time required, cues to initiate and continue movement. Assist at trunk and LE Sitting - Scoot to Edge of Bed: 2: Max assist Sitting - Scoot to [place] of Bed Details (indicate cue type and reason): Max A wpad after pt unable to scoot with cues Transfers Sit to Stand: 4: Min assist;With upper extremity assist;From bed Sit to Stand Details (indicate cue type and reason): Cues to initiate, hand palcement and assist for lifting.  Stand to Sit: 4: Min assist;With upper extremity assist;With armrests;To chair/3-in-1 Stand to Sit Details: verbal and tactile cues for hand placement. increased time required Ambulation/Gait Ambulation/Gait Assistance: 4: Min assist Ambulation/Gait Assistance Details (indicate cue type and reason): Cues for posture and to continue  stepping R/L. Pt would stop frequently or with discontinuous steps for 1-2 min, and then walk continuously for 10 seconds, and repeat, as though impaired attention to task or perseverating on obstacles. Pt did collide with obstacles x2 on L.  Ambulation Distance (Feet): 30 Feet Assistive device: Rolling walker Gait Pattern: Step-through pattern;Decreased stride length;Shuffle;Trunk flexed Stairs: No Wheelchair Mobility Wheelchair Mobility: No  Posture/Postural Control Posture/Postural Control: No significant limitations Balance Balance Assessed: Yes Dynamic Sitting Balance Dynamic Sitting - Balance Support: Right upper extremity supported;Left upper extremity supported;Feet supported Dynamic Sitting - Level of Assistance: 5: Stand by assistance Dynamic Sitting Balance - Compensations: Pt unable to reach outside BOS unless cued, and then limited by UE ROM    End of Session PT - End of Session Equipment Utilized During Treatment: Gait belt Activity Tolerance: Patient tolerated treatment well Patient left: in chair;with call bell in reach;with family/visitor present Nurse Communication: Mobility status for transfers;Mobility status for ambulation General Behavior During Session: [facility] for tasks performed Cognition: Impaired (Decreased problem solving and attention)  Virl Cagey, PT [phone]  10/08/2011, 3:41 PM
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
## 2011-10-08 NOTE — Progress Notes (Signed)
Subjective: Remains with significant mobility challenges related to acute pain, shoulder immobility.   Objective: Vital signs in last 24 hours: Temp:  [97.4 F (36.3 C)-99.1 F (37.3 C)] 97.4 F (36.3 C) (03/19 0800) Pulse Rate:  [61-99] 76  (03/19 0800) Resp:  [16-20] 18  (03/19 0800) BP: (129-152)/(74-89) 144/79 mmHg (03/19 0800) SpO2:  [92 %-96 %] 92 % (03/19 0800) Weight change:  Last BM Date: 10/05/11  Intake/Output from previous day: 03/18 0701 - 03/19 0700 In: 720 [P.O.:720] Out: -     Physical Exam: General: Alert, awake, oriented x3, in no acute distress. HEENT: No bruits, no goiter. Heart: Regular rate and rhythm, without murmurs, rubs, gallops. Lungs: Clear to auscultation bilaterally. Abdomen: Soft, nontender, nondistended, positive bowel sounds. Extremities: Shoulder exam limited by pain, diffuse tenderness to palpation, appears to be some swelling on inspection right compared to left Neuro: Moves all 4 extremities, gross sensory intact, motor preserved  Lab Results: Basic Metabolic Panel:  Basename 10/06/11 0644 10/05/11 1737 10/05/11 1104  NA 135 -- 138  K 3.5 -- 3.4*  CL 98 -- 100  CO2 26 -- --  GLUCOSE 207* -- 244*  BUN 13 -- 13  CREATININE 0.89 -- 1.00  CALCIUM 8.9 -- --  MG -- 1.7 --  PHOS -- -- --   CBC:  Basename 10/06/11 0644 10/05/11 1104 10/05/11 1045  WBC 11.7* -- 13.0*  NEUTROABS -- -- 7.9*  HGB 13.1 15.0 --  HCT 38.4* 44.0 --  MCV 85.0 -- 85.0  PLT 259 -- 253   Cardiac Enzymes:  Basename 10/05/11 1045  CKTOTAL --  CKMB --  CKMBINDEX --  TROPONINI <0.30   CBG:  Basename 10/08/11 0726 10/07/11 2058 10/07/11 1639 10/07/11 1140 10/07/11 0722 10/06/11 2122  GLUCAP 152* 201* 222* 187* 161* 216*   Hemoglobin A1C:  Basename 10/05/11 1424  HGBA1C 8.3*    Basename 10/05/11 1737  TSH 1.079  T4TOTAL --  FREET4 --  T3FREE --  THYROIDAB --   Coagulation:  Basename 10/05/11 1737  LABPROT 13.9  INR 1.05    Basename  10/05/11 1321  COLORURINE YELLOW  LABSPEC 1.014  PHURINE 6.0  GLUCOSEU 250*  HGBUR NEGATIVE  BILIRUBINUR NEGATIVE  KETONESUR NEGATIVE  PROTEINUR NEGATIVE  UROBILINOGEN 1.0  NITRITE NEGATIVE  LEUKOCYTESUR NEGATIVE    Studies/Results: Dg Cervical Spine With Flex & Extend  10/06/2011  *RADIOLOGY REPORT*  Clinical Data: Neck pain after fall.  CERVICAL SPINE COMPLETE WITH FLEXION AND EXTENSION VIEWS  Comparison: MRI cervical spine 10/06/2011.  CT cervical spine 10/05/2011.  Findings:  The cervical spine is visualized on the lateral upright view from skull base through C5-6.  There is no abnormal motion through a limited range of flexion and extension.  Advanced uncovertebral spurring is present throughout the cervical spine as previously described.  There is straightening and some reversal of the normal cervical lordosis, similar to the prior studies.  IMPRESSION:  1.  No abnormal motion from skull base through C5 through limited through a limited range of flexion and extension. 2.  Stable advanced spondylosis as previously described.  Original Report Authenticated By: Jamesetta Orleans. MATTERN, M.D.   Mr Cervical Spine Wo Contrast  10/06/2011  *RADIOLOGY REPORT*  Clinical Data: Neck pain and right cervical radicular symptoms after a fall.  MRI CERVICAL SPINE WITHOUT CONTRAST  Technique:  Multiplanar and multiecho pulse sequences of the cervical spine, to include the craniocervical junction and cervicothoracic junction, were obtained according to standard protocol without intravenous contrast.  Comparison: CT of the cervical spine 10/05/2011.  Findings: Signal is somewhat limited at the level of the cervical spine cord due to patient body habitus.  Normal signal is present in the cervical upper thoracic spinal cord to the level of T3.  There is straightening and reversal of the normal cervical lordosis at C3-4 through C5-6.  The STIR images demonstrate focal soft tissue signal posterior to the spinous  process of C7 and T1.  This likely represents chronic edema within the subcutaneous tissues.  There is no focal edema within the intraspinous ligaments.  No marrow edema is evident to suggest fracture.  C2-3:  Slight degenerative anterolisthesis is present.  Facet hypertrophy is worse on the left.  There is no significant stenosis.  C3-4:  A broad-based disc osteophyte complex is present.  Mild central and bilateral foraminal narrowing is present.  C4-5:  A broad-based disc osteophyte complex is present. Uncovertebral spurring is worse on the left.  Moderate central and mild bilateral foraminal stenosis is present.  C5-6:  A broad-based disc osteophyte complex is present.  Moderate central canal stenosis is present.  The canal is narrowed to 7 mm. Moderate to severe foraminal stenosis is evident bilaterally.  C6-7:  A broad-based disc osteophyte complex is present.  This effaces the ventral CSF.  Uncovertebral disease is evident bilaterally.  Mild central canal stenosis is present.  C7-T1:  Slight anterolisthesis is evident, uncovering the disc. This partially effaces the ventral CSF.  The foramina are patent bilaterally.  IMPRESSION:  1.  No definite acute traumatic injury. 2.  Study limited by lack of signal due to patient body habitus. 3.  Multilevel spondylosis as described. 4.  Central canal stenosis is greatest at C5-6.  5.  Multilevel foraminal stenosis as described.  Original Report Authenticated By: Jamesetta Orleans. MATTERN, M.D.    Medications: Scheduled Meds:    . acetaminophen  650 mg Oral QID  . diclofenac  1 patch Transdermal BID  . docusate sodium  100 mg Oral BID  . insulin aspart  0-15 Units Subcutaneous TID WC  . insulin aspart  0-5 Units Subcutaneous QHS  . insulin aspart  4 Units Subcutaneous TID WC  . insulin detemir  25 Units Subcutaneous Daily  . losartan  50 mg Oral BID  . pantoprazole  40 mg Oral Q1200  . sodium chloride  3 mL Intravenous Q12H   Continuous Infusions:  PRN  Meds:.alum & mag hydroxide-simeth, metoprolol, morphine, ondansetron (ZOFRAN) IV, ondansetron, oxyCODONE, senna-docusate, DISCONTD: acetaminophen, DISCONTD: acetaminophen  Assessment/Plan: 78yo gentleman s/p fall with no underlying medical etiology determined other than significant peripheral neuropathy, significant C-spine disease-fall resulted in SDH and significant shoulder pain possibly nerve root contusion that has drastically impacted his mobility. He lives with his wife and they are worried about being able to go home safely without assistance given his limitations. Order has been placed for CIR, still working on pain issues.  1. SDH: Stable, CT unchanged, per NS will need repeat scan in a couple of weeks. Appreciate Dr. Newell Coral following him as an outpatient. Neuro status is stable.  2. C-Spine severe DJD, may be contributing to his unsteadiness and gait problems, Dr. Newell Coral will follow as outpatient may be a QOL decsion for him.  3. Right shoulder pain and limited limb ROM: Pain still significant issues for him-he is worried about managing at home with his current level of pain, also seem sedated with meds, high fall risk, will refer him for inpatient rehab. -Started Flector  patch to right shoulder -scheduled tylenol QID -Continue prn oxycodone (makes him very drowsy) -I looked back at imaging obtained on admission and realized that there was no dedicated shoulder imaging-will get MRI of right shoulder to determine if RC injury or nerve contusion .  3. Possible Syncope, tele has been normal, labs normal, BP normal, dopplers normal -patient amnestic to events that led to fall.  4. DM: Stable in hospital reduced dose of Levemir for now on modified diet.  4. Dispo: Home tomorrow with OP follow up in 1-2 weeks. Will get PT eval for home recs.    LOS: 3 days   [facility] Pager: [phone] 10/08/2011, 10:10 AM

## 2011-10-08 NOTE — Progress Notes (Signed)
Inpatient Diabetes Program Recommendations  AACE/ADA: New Consensus Statement on Inpatient Glycemic Control (2009)  Target Ranges:  Prepandial:   less than 140 mg/dL      Peak postprandial:   less than 180 mg/dL (1-2 hours)      Critically ill patients:  140 - 180 mg/dL   Results for SHERRON, MUMMERT (MRN [id number]) as of 10/08/2011 11:44  Ref. Range 10/07/2011 07:22 10/07/2011 11:40 10/07/2011 16:39 10/07/2011 20:58  Glucose-Capillary Latest Range: 70-99 mg/dL 409 (H) 811 (H) 914 (H) 201 (H)    Inpatient Diabetes Program Recommendations Insulin - Basal: Please increase Levemir to 30 units daily. Insulin - Meal Coverage: Please increase Novolog meal coverage to 5 units tid with meals (currently ordered as Novolog 4 units tid with meals)  Note: Will follow. Ambrose Finland RN, MSN, CDE Diabetes Coordinator Inpatient Diabetes Program [phone]

## 2011-10-09 ENCOUNTER — Encounter (HOSPITAL_COMMUNITY): Payer: Self-pay | Admitting: Physical Medicine and Rehabilitation

## 2011-10-09 ENCOUNTER — Inpatient Hospital Stay (HOSPITAL_COMMUNITY)
Admission: RE | Admit: 2011-10-09 | Discharge: 2011-10-16 | DRG: 946 | Disposition: A | Discharge status: Home / Self Care | Payer: Medicare Other | Source: Ambulatory Visit | Attending: Physical Medicine & Rehabilitation | Admitting: Physical Medicine & Rehabilitation

## 2011-10-09 ENCOUNTER — Inpatient Hospital Stay (HOSPITAL_COMMUNITY): Payer: Medicare Other

## 2011-10-09 DIAGNOSIS — Z87891 Personal history of nicotine dependence: Secondary | ICD-10-CM

## 2011-10-09 DIAGNOSIS — E1142 Type 2 diabetes mellitus with diabetic polyneuropathy: Secondary | ICD-10-CM

## 2011-10-09 DIAGNOSIS — M5030 Other cervical disc degeneration, unspecified cervical region: Secondary | ICD-10-CM

## 2011-10-09 DIAGNOSIS — W108XXA Fall (on) (from) other stairs and steps, initial encounter: Secondary | ICD-10-CM

## 2011-10-09 DIAGNOSIS — M75121 Complete rotator cuff tear or rupture of right shoulder, not specified as traumatic: Secondary | ICD-10-CM

## 2011-10-09 DIAGNOSIS — Z79899 Other long term (current) drug therapy: Secondary | ICD-10-CM

## 2011-10-09 DIAGNOSIS — E669 Obesity, unspecified: Secondary | ICD-10-CM

## 2011-10-09 DIAGNOSIS — J309 Allergic rhinitis, unspecified: Secondary | ICD-10-CM

## 2011-10-09 DIAGNOSIS — I10 Essential (primary) hypertension: Secondary | ICD-10-CM | POA: Diagnosis present

## 2011-10-09 DIAGNOSIS — Z5189 Encounter for other specified aftercare: Principal | ICD-10-CM

## 2011-10-09 DIAGNOSIS — M7530 Calcific tendinitis of unspecified shoulder: Secondary | ICD-10-CM

## 2011-10-09 DIAGNOSIS — M719 Bursopathy, unspecified: Secondary | ICD-10-CM

## 2011-10-09 DIAGNOSIS — M67919 Unspecified disorder of synovium and tendon, unspecified shoulder: Secondary | ICD-10-CM

## 2011-10-09 DIAGNOSIS — S069X9A Unspecified intracranial injury with loss of consciousness of unspecified duration, initial encounter: Secondary | ICD-10-CM

## 2011-10-09 DIAGNOSIS — M47812 Spondylosis without myelopathy or radiculopathy, cervical region: Secondary | ICD-10-CM

## 2011-10-09 DIAGNOSIS — S065XAA Traumatic subdural hemorrhage with loss of consciousness status unknown, initial encounter: Secondary | ICD-10-CM | POA: Diagnosis present

## 2011-10-09 DIAGNOSIS — E1149 Type 2 diabetes mellitus with other diabetic neurological complication: Secondary | ICD-10-CM | POA: Diagnosis present

## 2011-10-09 DIAGNOSIS — S065X9A Traumatic subdural hemorrhage with loss of consciousness of unspecified duration, initial encounter: Secondary | ICD-10-CM | POA: Diagnosis present

## 2011-10-09 DIAGNOSIS — G4733 Obstructive sleep apnea (adult) (pediatric): Secondary | ICD-10-CM

## 2011-10-09 DIAGNOSIS — Z96659 Presence of unspecified artificial knee joint: Secondary | ICD-10-CM

## 2011-10-09 DIAGNOSIS — E119 Type 2 diabetes mellitus without complications: Secondary | ICD-10-CM | POA: Diagnosis present

## 2011-10-09 DIAGNOSIS — R269 Unspecified abnormalities of gait and mobility: Secondary | ICD-10-CM

## 2011-10-09 HISTORY — DX: Calculus of gallbladder without cholecystitis without obstruction: K80.20

## 2011-10-09 LAB — GLUCOSE, CAPILLARY
Glucose-Capillary: 143 mg/dL — ABNORMAL HIGH (ref 70–99)
Glucose-Capillary: 172 mg/dL — ABNORMAL HIGH (ref 70–99)

## 2011-10-09 MED ORDER — INSULIN ASPART 100 UNIT/ML ~~LOC~~ SOLN
0.0000 [IU] | Freq: Three times a day (TID) | SUBCUTANEOUS | Status: DC
  Administered 2011-10-09: 3 [IU] via SUBCUTANEOUS
  Administered 2011-10-10: 8 [IU] via SUBCUTANEOUS
  Administered 2011-10-10 (×2): 2 [IU] via SUBCUTANEOUS
  Administered 2011-10-11: 3 [IU] via SUBCUTANEOUS
  Administered 2011-10-11: 2 [IU] via SUBCUTANEOUS
  Administered 2011-10-11: 3 [IU] via SUBCUTANEOUS
  Administered 2011-10-12 (×2): 2 [IU] via SUBCUTANEOUS
  Administered 2011-10-12 – 2011-10-13 (×2): 3 [IU] via SUBCUTANEOUS
  Administered 2011-10-14 (×3): 2 [IU] via SUBCUTANEOUS
  Administered 2011-10-15: 3 [IU] via SUBCUTANEOUS
  Administered 2011-10-15: 2 [IU] via SUBCUTANEOUS

## 2011-10-09 MED ORDER — ALUM & MAG HYDROXIDE-SIMETH 200-200-20 MG/5ML PO SUSP: 30.0000 mL | ORAL | Status: DC | PRN

## 2011-10-09 MED ORDER — TRAMADOL HCL 50 MG PO TABS: 50.0000 mg | ORAL_TABLET | Freq: Four times a day (QID) | ORAL | Status: DC | PRN

## 2011-10-09 MED ORDER — TRAZODONE HCL 50 MG PO TABS: 25.0000 mg | ORAL_TABLET | Freq: Every evening | ORAL | Status: DC | PRN

## 2011-10-09 MED ORDER — INSULIN DETEMIR 100 UNIT/ML ~~LOC~~ SOLN
25.0000 [IU] | Freq: Every day | SUBCUTANEOUS | Status: DC
  Administered 2011-10-10 – 2011-10-16 (×7): 25 [IU] via SUBCUTANEOUS
  Filled 2011-10-09: qty 3

## 2011-10-09 MED ORDER — DIPHENHYDRAMINE HCL 12.5 MG/5ML PO ELIX: 12.5000 mg | ORAL_SOLUTION | Freq: Four times a day (QID) | ORAL | Status: DC | PRN

## 2011-10-09 MED ORDER — DOCUSATE SODIUM 100 MG PO CAPS
100.0000 mg | ORAL_CAPSULE | Freq: Two times a day (BID) | ORAL | Status: DC
  Administered 2011-10-09 – 2011-10-16 (×14): 100 mg via ORAL
  Filled 2011-10-09 (×16): qty 1

## 2011-10-09 MED ORDER — HYDROCODONE-ACETAMINOPHEN 5-325 MG PO TABS
1.0000 | ORAL_TABLET | ORAL | Status: DC | PRN
  Filled 2011-10-09 (×3): qty 1
  Filled 2011-10-09: qty 2
  Filled 2011-10-09 (×4): qty 1

## 2011-10-09 MED ORDER — PROCHLORPERAZINE MALEATE 5 MG PO TABS
5.0000 mg | ORAL_TABLET | Freq: Four times a day (QID) | ORAL | Status: DC | PRN
  Filled 2011-10-09: qty 2

## 2011-10-09 MED ORDER — PROCHLORPERAZINE EDISYLATE 5 MG/ML IJ SOLN
5.0000 mg | Freq: Four times a day (QID) | INTRAMUSCULAR | Status: DC | PRN
  Filled 2011-10-09: qty 2

## 2011-10-09 MED ORDER — DICLOFENAC EPOLAMINE 1.3 % TD PTCH
1.0000 | MEDICATED_PATCH | Freq: Two times a day (BID) | TRANSDERMAL | Status: DC
  Filled 2011-10-09 (×2): qty 1

## 2011-10-09 MED ORDER — GUAIFENESIN-DM 100-10 MG/5ML PO SYRP: 5.0000 mL | ORAL_SOLUTION | Freq: Four times a day (QID) | ORAL | Status: DC | PRN

## 2011-10-09 MED ORDER — ACETAMINOPHEN 325 MG PO TABS: 325.0000 mg | ORAL_TABLET | ORAL | Status: DC | PRN

## 2011-10-09 MED ORDER — DICLOFENAC EPOLAMINE 1.3 % TD PTCH
1.0000 | MEDICATED_PATCH | Freq: Two times a day (BID) | TRANSDERMAL | Status: DC
  Administered 2011-10-09 – 2011-10-10 (×3): 1 via TRANSDERMAL
  Filled 2011-10-09 (×6): qty 1

## 2011-10-09 MED ORDER — MUSCLE RUB 10-15 % EX CREA
TOPICAL_CREAM | Freq: Three times a day (TID) | CUTANEOUS | Status: DC
  Administered 2011-10-09 – 2011-10-16 (×19): via TOPICAL
  Filled 2011-10-09: qty 85

## 2011-10-09 MED ORDER — TROLAMINE SALICYLATE 10 % EX CREA
TOPICAL_CREAM | Freq: Three times a day (TID) | CUTANEOUS | Status: DC
  Filled 2011-10-09 (×2): qty 85

## 2011-10-09 MED ORDER — INSULIN ASPART 100 UNIT/ML ~~LOC~~ SOLN: 0.0000 [IU] | Freq: Three times a day (TID) | SUBCUTANEOUS | Status: DC

## 2011-10-09 MED ORDER — INSULIN ASPART 100 UNIT/ML ~~LOC~~ SOLN
4.0000 [IU] | Freq: Three times a day (TID) | SUBCUTANEOUS | Status: DC
  Administered 2011-10-09 – 2011-10-15 (×18): 4 [IU] via SUBCUTANEOUS
  Administered 2011-10-15: 100 [IU] via SUBCUTANEOUS
  Administered 2011-10-16: 4 [IU] via SUBCUTANEOUS

## 2011-10-09 MED ORDER — BISACODYL 10 MG RE SUPP: 10.0000 mg | Freq: Every day | RECTAL | Status: DC | PRN

## 2011-10-09 MED ORDER — HYDROCODONE-ACETAMINOPHEN 5-325 MG PO TABS
1.0000 | ORAL_TABLET | Freq: Two times a day (BID) | ORAL | Status: DC
  Administered 2011-10-10 – 2011-10-16 (×13): 1 via ORAL
  Filled 2011-10-09 (×5): qty 1

## 2011-10-09 MED ORDER — INSULIN ASPART 100 UNIT/ML ~~LOC~~ SOLN: 0.0000 [IU] | Freq: Every day | SUBCUTANEOUS | Status: DC

## 2011-10-09 MED ORDER — POLYETHYLENE GLYCOL 3350 17 G PO PACK
17.0000 g | PACK | Freq: Every day | ORAL | Status: DC
  Administered 2011-10-10 – 2011-10-16 (×7): 17 g via ORAL
  Filled 2011-10-09 (×8): qty 1

## 2011-10-09 MED ORDER — METHOCARBAMOL 500 MG PO TABS: 500.0000 mg | ORAL_TABLET | Freq: Four times a day (QID) | ORAL | Status: DC | PRN

## 2011-10-09 MED ORDER — FLEET ENEMA 7-19 GM/118ML RE ENEM
1.0000 | ENEMA | Freq: Once | RECTAL | Status: AC | PRN
  Filled 2011-10-09: qty 1

## 2011-10-09 MED ORDER — BISACODYL 10 MG RE SUPP
10.0000 mg | Freq: Once | RECTAL | Status: AC
  Administered 2011-10-09: 10 mg via RECTAL
  Filled 2011-10-09: qty 1

## 2011-10-09 MED ORDER — INSULIN DETEMIR 100 UNIT/ML ~~LOC~~ SOLN
5.0000 [IU] | Freq: Every day | SUBCUTANEOUS | Status: DC
  Administered 2011-10-09 – 2011-10-15 (×7): 5 [IU] via SUBCUTANEOUS

## 2011-10-09 MED ORDER — PANTOPRAZOLE SODIUM 40 MG PO TBEC
40.0000 mg | DELAYED_RELEASE_TABLET | Freq: Every day | ORAL | Status: DC
  Administered 2011-10-10 – 2011-10-15 (×6): 40 mg via ORAL
  Filled 2011-10-09 (×6): qty 1

## 2011-10-09 MED ORDER — PROCHLORPERAZINE 25 MG RE SUPP
12.5000 mg | Freq: Four times a day (QID) | RECTAL | Status: DC | PRN
  Filled 2011-10-09: qty 1

## 2011-10-09 MED ORDER — LOSARTAN POTASSIUM 50 MG PO TABS
50.0000 mg | ORAL_TABLET | Freq: Two times a day (BID) | ORAL | Status: DC
  Administered 2011-10-09 – 2011-10-16 (×14): 50 mg via ORAL
  Filled 2011-10-09 (×16): qty 1

## 2011-10-09 NOTE — Progress Notes (Signed)
I spoke with patient and his wife at bedside. Patient both are in agreement to CIR admit. I have begun precert with his insurance company and await their approval. Please call with any questions. Pager [phone].

## 2011-10-09 NOTE — Plan of Care (Addendum)
Overall Plan of Care [facility]) Patient Details Name: Joshua Glass MRN: [id number] DOB: 1932/11/29  Diagnosis:  Left subdural hemorrhage, gait disorder  Primary Diagnosis:    SDH (subdural hematoma) Co-morbidities: peripheral neuropathy, right rotator cuff tear  Functional Problem List  Patient demonstrates impairments in the following areas: Balance, Bladder, Bowel, Edema, Endurance, Medication Management, Motor, Nutrition, Pain, Safety and Sensory   Basic ADL's: eating, grooming, bathing, dressing and toileting Advanced ADL's: simple meal preparation  Transfers:  bed mobility, bed to chair, toilet, tub/shower, car and furniture Locomotion:  ambulation and stairs  Additional Impairments:  Functional use of upper extremity and Social Cognition   problem solving and memory  Anticipated Outcomes Item Anticipated Outcome  Eating/Swallowing    Basic self-care  Mod I  Tolieting  Mod I  Bowel/Bladder  Mod independent and continent  Transfers  Mod independent  Locomotion  Mod independent  Communication    Cognition  Mod I  Pain  < 3 on 1-10 scale  Safety/Judgment  Mod I  Other  Skin: min assist with turn/reposition/skin care, no new breakdown   Therapy Plan: PT Frequency: 2-3 X/day, 60-90 minutes OT Frequency: 1-2 X/day, 60-90 minutes SLP Frequency: 1-2 X/day, 30-60 minutes   Team Interventions: Item RN PT OT SLP SW TR Other  Self Care/Advanced ADL Retraining   x      Neuromuscular Re-Education  x x      Therapeutic Activities  x x x  x   UE/LE Strength Training/ROM  x x      UE/LE Coordination Activities  x x   x   Visual/Perceptual Remediation/Compensation         DME/Adaptive Equipment Instruction  x xx   x   Therapeutic Exercise  x x   x   Balance/Vestibular Training  x x   x   Patient/Family Education  x x x  x   Cognitive Remediation/Compensation  x x x  x   Functional Mobility Training  x x   x   Ambulation/Gait Training  x       Statistician Reintegration   x   x   Dysphagia/Aspiration Film/video editor         Bladder Management x        Bowel Management x        Disease Management/Prevention x        Pain Management x x       Medication Management x        Skin Care/Wound Management x        Splinting/Orthotics         Discharge Planning   x  x x   Psychosocial Support     x x                      Team Discharge Planning: Destination:  Home Projected Follow-up:  PT, OT and Outpatient Projected Equipment Needs:  Walker Patient/family involved in discharge planning:  Yes  MD ELOS: 1 week Medical Rehab Prognosis:  Excellent Assessment: Pt has been admitted for CIR therapies with goals set at modified independent except for occasional min assist with some self-caretasks.  Focus will be on balance, pain mgt, adaptive equipment, safety, education.

## 2011-10-09 NOTE — Progress Notes (Signed)
Report called to 4000, pt transported to 4004, pt remained in stable condition

## 2011-10-09 NOTE — Progress Notes (Signed)
Patient admitted to 4004 from 51 at 66. Report received from [place], [place] on 3700. Wife accompanied. Patient and wife oriented to unit, call bell system, rehab schedule, safety plan. Dulcolax suppository had been given on 3700 and was effective on admission-patient had continent large formed BM. Soap suds enema not administered. Hedy Camara
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
## 2011-10-09 NOTE — Discharge Summary (Signed)
Admit date: 10/05/2011 Discharge date: 10/09/2011  Primary Care Physician:  Abigail Miyamoto, MD, MD   Discharge Diagnoses:   No resolved problems to display.  Active Hospital Problems  Diagnoses Date Noted   . SDH (subdural hematoma) 10/05/2011   . Fall 10/05/2011   . DIABETES MELLITUS 03/29/2008   . OBSTRUCTIVE SLEEP APNEA 03/29/2008   . AUTONOMIC NEUROPATHY, DIABETIC 03/29/2008   . HYPERTENSION 03/29/2008   . ALLERGIC RHINITIS 03/29/2008     Resolved Hospital Problems  Diagnoses Date Noted Date Resolved     DISCHARGE MEDICATION: Medication List  As of 10/09/2011  1:26 PM   STOP taking these medications         aspirin 81 MG tablet      insulin aspart 100 UNIT/ML injection      insulin detemir 100 UNIT/ML injection      potassium chloride SA 20 MEQ tablet      triamterene-hydrochlorothiazide 37.5-25 MG per tablet         TAKE these medications         losartan 50 MG tablet   Commonly known as: COZAAR   Take 50 mg by mouth 2 (two) times daily.      omeprazole 40 MG capsule   Commonly known as: PRILOSEC   Take 40 mg by mouth every other day.    diclofenac (FLECTOR) 1.3 % 1 patch Dose: 1 patch Freq: 2 times daily Route: TD docusate sodium (COLACE) capsule 100 mg Dose: 100 mg Freq: 2 times daily Route: PO insulin detemir (LEVEMIR) injection 25 Units Dose: 25 Units Freq: Daily Route: [place] Tramadol 50 mg q 6 hrs prn pain             Consults: Treatment Team:  Hewitt Shorts, MD   SIGNIFICANT DIAGNOSTIC STUDIES:  Dg Cervical Spine With Flex & Extend  10/06/2011  *RADIOLOGY REPORT*  Clinical Data: Neck pain after fall.  CERVICAL SPINE COMPLETE WITH FLEXION AND EXTENSION VIEWS  Comparison: MRI cervical spine 10/06/2011.  CT cervical spine 10/05/2011.  Findings:  The cervical spine is visualized on the lateral upright view from skull base through C5-6.  There is no abnormal motion through a limited range of flexion and extension.  Advanced  uncovertebral spurring is present throughout the cervical spine as previously described.  There is straightening and some reversal of the normal cervical lordosis, similar to the prior studies.  IMPRESSION:  1.  No abnormal motion from skull base through C5 through limited through a limited range of flexion and extension. 2.  Stable advanced spondylosis as previously described.  Original Report Authenticated By: Jamesetta Orleans. MATTERN, M.D.   Ct Head Wo Contrast  10/06/2011  *RADIOLOGY REPORT*  Clinical Data: Left subdural hematoma.  CT HEAD WITHOUT CONTRAST  Technique:  Contiguous axial images were obtained from the base of the skull through the vertex without contrast.  Comparison: CT head without contrast 10/05/2011.  Findings: Left sided subdural blood adjacent to the falx is not significantly changed.  Blood extends onto the left tentorium as before.  There is no significant mass effect.  No acute cortical infarct, parenchymal hemorrhage, or mass lesion is present.  Atrophy and white matter disease is otherwise stable. The ventricles are proportionate to the degree of atrophy.  No other significant extra-axial fluid collection is present.  Mild mucosal thickening is present in the ethmoid air cells bilaterally. The paranasal sinuses and mastoid air cells are clear.  A high left parietal scalp hematoma is similar to the  prior exam.  IMPRESSION:  1.  Stable appearance of a left subdural hematoma as described. 2.  Stable atrophy and white matter disease. 3.  A similar appearance of a high left parietal scalp hematoma without underlying fracture.  Original Report Authenticated By: Jamesetta Orleans. MATTERN, M.D.   Ct Head Wo Contrast  10/05/2011  The *RADIOLOGY REPORT*  Clinical Data:  Fall, syncope  CT HEAD WITHOUT CONTRAST CT CERVICAL SPINE WITHOUT CONTRAST  Technique:  Multidetector CT imaging of the head and cervical spine was performed following the standard protocol without intravenous contrast.   Multiplanar CT image reconstructions of the cervical spine were also generated.  Comparison:   None  CT HEAD  Findings: No skull fracture is noted. There is mucosal thickening with partial opacification inferior aspect of the right frontal sinus and bilateral ethmoid air cells.  The atherosclerotic calcifications of carotid siphon are noted.  There is  subdural hemorrhage along the midline interhemispheric fissure measuring 8 mm maximum thickness.  There is no significant mass effect or midline shift.  Small amount of hemorrhage is also noted along the left tentorium in axial image 11.  No acute infarction.  Mild cerebral atrophy.  There is scalp swelling and small subcutaneous hematoma midline posterior parietal region high convexity.  IMPRESSION: There is  subdural hemorrhage along the midline interhemispheric fissure measuring 8 mm maximum thickness.  There is no significant mass effect or midline shift.  Small amount of hemorrhage is also noted along the left tentorium in axial image 11.  No acute infarction.  Mild cerebral atrophy.  There is scalp swelling and small subcutaneous hematoma midline posterior parietal region high convexity.  CT CERVICAL SPINE  Findings: Axial images of the cervical spine shows no acute fracture or subluxation.  Computer processed images shows extensive disc space flattening with large anterior osteophytes at C3-C4 C4-C5 C5-C6 and C6-C7 level.  The mild posterior osteophytes are noted at C3-C4 C4-C5 C5- C6 and C6-C7 level.  There is mild about 2 mm anterior subluxation C2 on C3 vertebral body probable chronic in nature.  No prevertebral soft tissue swelling.  Cervical airway is patent. Coronal images shows atherosclerotic calcifications bilateral carotid bifurcation left greater than right.  IMPRESSION:  1.  No acute fracture or subluxation.  Probable chronic mild about to 2 mm anterior subluxation C2 on C3 vertebral body.  Extensive degenerative changes as described above.   Critical findings were discussed immediately with Dr. Rubin Payor from emergency room.  Original Report Authenticated By: Natasha Mead, M.D.   Ct Cervical Spine Wo Contrast  10/05/2011  The *RADIOLOGY REPORT*  Clinical Data:  Fall, syncope  CT HEAD WITHOUT CONTRAST CT CERVICAL SPINE WITHOUT CONTRAST  Technique:  Multidetector CT imaging of the head and cervical spine was performed following the standard protocol without intravenous contrast.  Multiplanar CT image reconstructions of the cervical spine were also generated.  Comparison:   None  CT HEAD  Findings: No skull fracture is noted. There is mucosal thickening with partial opacification inferior aspect of the right frontal sinus and bilateral ethmoid air cells.  The atherosclerotic calcifications of carotid siphon are noted.  There is  subdural hemorrhage along the midline interhemispheric fissure measuring 8 mm maximum thickness.  There is no significant mass effect or midline shift.  Small amount of hemorrhage is also noted along the left tentorium in axial image 11.  No acute infarction.  Mild cerebral atrophy.  There is scalp swelling and small subcutaneous hematoma midline posterior parietal region high  convexity.  IMPRESSION: There is  subdural hemorrhage along the midline interhemispheric fissure measuring 8 mm maximum thickness.  There is no significant mass effect or midline shift.  Small amount of hemorrhage is also noted along the left tentorium in axial image 11.  No acute infarction.  Mild cerebral atrophy.  There is scalp swelling and small subcutaneous hematoma midline posterior parietal region high convexity.  CT CERVICAL SPINE  Findings: Axial images of the cervical spine shows no acute fracture or subluxation.  Computer processed images shows extensive disc space flattening with large anterior osteophytes at C3-C4 C4-C5 C5-C6 and C6-C7 level.  The mild posterior osteophytes are noted at C3-C4 C4-C5 C5- C6 and C6-C7 level.  There is mild about 2  mm anterior subluxation C2 on C3 vertebral body probable chronic in nature.  No prevertebral soft tissue swelling.  Cervical airway is patent. Coronal images shows atherosclerotic calcifications bilateral carotid bifurcation left greater than right.  IMPRESSION:  1.  No acute fracture or subluxation.  Probable chronic mild about to 2 mm anterior subluxation C2 on C3 vertebral body.  Extensive degenerative changes as described above.  Critical findings were discussed immediately with Dr. Rubin Payor from emergency room.  Original Report Authenticated By: Natasha Mead, M.D.   Mr Cervical Spine Wo Contrast  10/06/2011  *RADIOLOGY REPORT*  Clinical Data: Neck pain and right cervical radicular symptoms after a fall.  MRI CERVICAL SPINE WITHOUT CONTRAST  Technique:  Multiplanar and multiecho pulse sequences of the cervical spine, to include the craniocervical junction and cervicothoracic junction, were obtained according to standard protocol without intravenous contrast.  Comparison: CT of the cervical spine 10/05/2011.  Findings: Signal is somewhat limited at the level of the cervical spine cord due to patient body habitus.  Normal signal is present in the cervical upper thoracic spinal cord to the level of T3.  There is straightening and reversal of the normal cervical lordosis at C3-4 through C5-6.  The STIR images demonstrate focal soft tissue signal posterior to the spinous process of C7 and T1.  This likely represents chronic edema within the subcutaneous tissues.  There is no focal edema within the intraspinous ligaments.  No marrow edema is evident to suggest fracture.  C2-3:  Slight degenerative anterolisthesis is present.  Facet hypertrophy is worse on the left.  There is no significant stenosis.  C3-4:  A broad-based disc osteophyte complex is present.  Mild central and bilateral foraminal narrowing is present.  C4-5:  A broad-based disc osteophyte complex is present. Uncovertebral spurring is worse on the left.   Moderate central and mild bilateral foraminal stenosis is present.  C5-6:  A broad-based disc osteophyte complex is present.  Moderate central canal stenosis is present.  The canal is narrowed to 7 mm. Moderate to severe foraminal stenosis is evident bilaterally.  C6-7:  A broad-based disc osteophyte complex is present.  This effaces the ventral CSF.  Uncovertebral disease is evident bilaterally.  Mild central canal stenosis is present.  C7-T1:  Slight anterolisthesis is evident, uncovering the disc. This partially effaces the ventral CSF.  The foramina are patent bilaterally.  IMPRESSION:  1.  No definite acute traumatic injury. 2.  Study limited by lack of signal due to patient body habitus. 3.  Multilevel spondylosis as described. 4.  Central canal stenosis is greatest at C5-6.  5.  Multilevel foraminal stenosis as described.  Original Report Authenticated By: Jamesetta Orleans. MATTERN, M.D.   Mr Shoulder Right Wo Contrast  10/09/2011  *RADIOLOGY REPORT*  Clinical Data: Status post fall.  Shoulder pain.  MRI OF THE RIGHT SHOULDER WITHOUT CONTRAST  Technique:  Multiplanar, multisequence MR imaging was performed. No intravenous contrast was administered.  Comparison: None.  Findings: There is no fracture or dislocation.  The supraspinatus, infraspinatus and subscapularis tendons are all completely torn. The supraspinatus and infraspinatus are retracted to the level of the glenoid, 5-6 cm.  There is atrophy of all three muscle bellies, worst in the supraspinatus.  Two large calcifications are seen in the supraspinatus measuring 2.1 and 1.0 cm in diameter.  Fluid tracks into both muscle bellies.  The teres minor is intact.  The long head of biceps tendon is completely torn.  The labrum is diffusely torn with severe glenohumeral degenerative change present.  Large osteophyte off the humeral head is noted.  There is acromioclavicular degenerative change and the joint appears widened possibly due to old trauma.  The  humerus abuts the undersurface of the acromion which appears remodeled with subchondral edema.  Loose bodies are identified in the glenohumeral joint measuring up to 0.9 cm in diameter. A large volume of fluid is present in the subacromial/subdeltoid bursa.  IMPRESSION:  1.  Complete supraspinatus, infraspinatus and subscapularis tendon tears with marked retraction and atrophy as described above. 2.  Severe glenohumeral degenerative change with diffuse tearing of the labrum and loose bodies in the joint. 3.  Large volume of fluid in the subacromial/subdeltoid bursa consistent with bursitis. 4.  Acromioclavicular degenerative change. The joint appears widened, possibly due to old trauma. 5.  Negative for evidence of acute or subacute trauma.  Original Report Authenticated By: Bernadene Bell. Maricela Curet, M.D.   Dg Chest Port 1 View  10/05/2011  *RADIOLOGY REPORT*  Clinical Data: Syncope.  Former smoker with history of hypertension and diabetes.  PORTABLE CHEST - 1 VIEW 10/05/2011 1059 hours:  Comparison: None.  Findings: Suboptimal inspiration due to body habitus which accounts for crowded bronchovascular markings at the bases and accentuates the cardiac silhouette.  Taking this into account, cardiac silhouette mildly enlarged and lungs clear.  Pulmonary vascularity normal.  No visible pleural effusions.  IMPRESSION: Suboptimal inspiration.  Mild cardiomegaly.  No acute cardiopulmonary disease.  Original Report Authenticated By: Arnell Sieving, M.D.     ECHO: Study Conclusions 10/07/11  - Left ventricle: The cavity size was normal. Systolic function was normal. The estimated ejection fraction was in the range of 55% to 60%. Wall motion was normal; there were no regional wall motion abnormalities. - Atrial septum: No defect or patent foramen ovale was identified.       CARDIAC CATH & OTHER PROCEDURES: Please refer to chart for results of CT's and MRI's obtained.  No results found for this or any  previous visit (from the past 240 hour(s)).  BRIEF ADMITTING H & P:  78 yo gentleman with multiple chronic medical diseases presented to the ED after sustaining a fall at home this afternoon. He has no recollection of events preceding or immediately following the event. Fall was not witnessed, large hematoma on back of head. No recent illness or change in routine or habits.    1. SDH: Stable, CT unchanged, per NS will need repeat scan in a couple of weeks. Appreciate Dr. Newell Coral following him as an outpatient. Neuro status is stable.  2. C-Spine severe DJD, may be contributing to his unsteadiness and gait problems, Dr. Newell Coral will follow as outpatient may be a QOL decsion for him.  3. Right shoulder pain and limited limb  ROM:  Pain still significant issues for him-he is worried about managing at home with his current level of pain, also seem sedated with meds, high fall risk, will refer him for inpatient rehab.  -Started Flector patch to right shoulder  -scheduled tylenol QID  -try prn tramadol -I looked back at imaging obtained on admission and realized that there was no dedicated shoulder imaging-will get MRI of right shoulder results were discussed with patient's Orthopaedic surgeon and he would like for patient to set up an appointment with their group in the future if he continues to experience discomfort at his shoulder.  3. Possible Syncope, tele has been normal, labs normal, BP normal, dopplers normal -patient amnestic to events that led to fall.  4. DM: Stable in hospital reduced dose of Levemir for now on modified diet.  4. Dispo: Inpatient rehab with follow up with neurosurgeon in 2 weeks or sooner should new concerns arise.   No resolved problems to display.  Active Hospital Problems  Diagnoses Date Noted   . SDH (subdural hematoma) 10/05/2011   . Fall 10/05/2011   . DIABETES MELLITUS 03/29/2008   . OBSTRUCTIVE SLEEP APNEA 03/29/2008   . AUTONOMIC NEUROPATHY, DIABETIC  03/29/2008   . HYPERTENSION 03/29/2008   . ALLERGIC RHINITIS 03/29/2008     Resolved Hospital Problems  Diagnoses Date Noted Date Resolved     Disposition and Follow-up: Follow up with Dr. Newell Coral, [address] Phone number [phone] Also follow up with orthopaedic surgeon in 2-3 weeks Dr. Homero Fellers [facility] Discharge Orders    Future Appointments: Provider: Department: Dept Phone: Center:   03/30/2012 10:15 AM Waymon Budge, MD [facility] [phone] None     Follow-up Information    Follow up with Abigail Miyamoto, MD .          DISCHARGE EXAM:  General: Alert, awake, oriented x3, in no acute distress.  HEENT: No bruits, no goiter.  Heart: Regular rate and rhythm, without murmurs, rubs, gallops.  Lungs: Clear to auscultation bilaterally.  Abdomen: Soft, nontender, nondistended, positive bowel sounds.  Extremities: Shoulder exam limited by pain, diffuse tenderness to palpation, appears to be some swelling on inspection right compared to left  Neuro: Moves all 4 extremities, gross sensory intact, motor preserved   Blood pressure 151/71, pulse 94, temperature 99.1 F (37.3 C), temperature source Oral, resp. rate 18, height 5\' 8"  (1.727 m), weight 107.502 kg (237 lb), SpO2 92.00%.  No results found for this basename: NA:2,K:2,CL:2,CO2:2,GLUCOSE:2,BUN:2,CREATININE:2,CALCIUM:2,MG:2,PHOS:2 in the last 72 hours No results found for this basename: AST:2,ALT:2,ALKPHOS:2,BILITOT:2,PROT:2,ALBUMIN:2 in the last 72 hours No results found for this basename: LIPASE:2,AMYLASE:2 in the last 72 hours No results found for this basename: WBC:2,NEUTROABS:2,HGB:2,HCT:2,MCV:2,PLT:2 in the last 72 hours  Signed: Penny Pia M.D. 10/09/2011, 1:26 PM

## 2011-10-09 NOTE — PMR Pre-admission (Signed)
PMR Admission Coordinator Pre-Admission Assessment  Patient:  Joshua Glass is an 78 y.o., male MRN:  [id number] DOB:  1933-03-23 Height:  5\' 8"  (172.7 cm) Weight:  107.502 kg (237 lb)  Insurance Information: HMO:yes   PPO:     PCP:     IPA:     80/20:     OTHER:Medicare replacement policy PRIMARY:[employer] Medicare      Policy#:[id number]      Subscriber:pt CM Name:Shanon Stacy     Phone#:[phone]     Fax#:on site Pre-Cert#:      Employer:retired Benefits:  Phone #:[phone]     Name:3/20  Eff. Date:07/23/2011 active     Deduct:none      Out of Pocket Max:$3400      Life WGN:FAOZ CIR:$195 per days days 1 through 6 then covered 100%      SNF:$50 per day days 1 through 20, then $150 per days 21 thru 36 then no copay days 37 through 100 Outpatient:$35/visit      Home Health:100%      Co-Pay:none DME:80%     Co-Pay:20% Providers:in network  SECONDARY:none        Current Medical History:   Patient Admitting Diagnosis:  SDH  History of Present Illness: Admitted 3/16 after found on the floor falling backwards down several concrete stairs.  Found unconscious and amnesia of events. Patient with gait problems and cause of fall questioned due to neuropathy vs syncope. CT brain with small left interhemispheric acute SDH.  Evaluated by Dr. Jule Ser who recommended monitoring with repeat CT. Patient also noted to have advanced multilevel cervical spondylosis with DDD at every cervical level and MRI C-spine ordered by Dr. Jule Ser. He plans to follow with him as an outpatient to decide about surgical options. Patient with right shoulder pain and some mild radicular symptoms due to fall. MRI shoulder pending today 10/09/11.  Patients Past Medical History:   Past Medical History  Diagnosis Date  . 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 Type II or unspecified type diabetes mellitus without mention of complication, not stated as  uncontrolled   . Allergic rhinitis, cause unspecified   . Unspecified essential hypertension   . Obstructive sleep apnea (adult) (pediatric)     NPSG 09-15-03 AHI 84.5, CPAP 18/ AHI 0  . Neuropathy   . Carpal tunnel syndrome on both sides    Family Medical History:  family history includes Coronary artery disease in his brother; Diabetes in his brother; Liver disease in his father; Other in his father; and Stroke in his mother. NIH Stroke scale: Total: 0  Glascow Coma Scale:   Patients Current Diet: Cardiac  Prior Rehab/Hospitalizations:out patient and home health only Medications Current Medications: Current facility-administered medications:acetaminophen (TYLENOL) tablet 650 mg, 650 mg, Oral, QID, Edsel Petrin, DO, 650 mg at 10/08/11 2138;  alum & mag hydroxide-simeth (MAALOX/MYLANTA) 200-200-20 MG/5ML suspension 30 mL, 30 mL, Oral, Q6H PRN, Edsel Petrin, DO;  diclofenac (FLECTOR) 1.3 % 1 patch, 1 patch, Transdermal, BID, Edsel Petrin, DO, 1 patch at 10/08/11 2139 docusate sodium (COLACE) capsule 100 mg, 100 mg, Oral, BID, Edsel Petrin, DO, 100 mg at 10/08/11 2139;  insulin aspart (novoLOG) injection 0-15 Units, 0-15 Units, Subcutaneous, TID WC, Edsel Petrin, DO, 2 Units at 10/09/11 [phone];  insulin aspart (novoLOG) injection 0-5 Units, 0-5 Units, Subcutaneous, QHS, Edsel Petrin, DO, 2 Units at 10/08/11 2140 insulin aspart (novoLOG) injection 4 Units,  4 Units, Subcutaneous, TID WC, Edsel Petrin, DO, 4 Units at 10/09/11 [phone];  insulin detemir (LEVEMIR) injection 25 Units, 25 Units, Subcutaneous, Daily, Edsel Petrin, DO, 25 Units at 10/08/11 [phone];  losartan (COZAAR) tablet 50 mg, 50 mg, Oral, BID, Edsel Petrin, DO, 50 mg at 10/08/11 2139;  metoprolol (LOPRESSOR) injection 5 mg, 5 mg, Intravenous, Q6H PRN, Edsel Petrin, DO morphine 2 MG/ML injection 2 mg, 2 mg, Intravenous, Q3H PRN, Edsel Petrin, DO, 1 mg at 10/06/11 0044;   ondansetron (ZOFRAN) injection 4 mg, 4 mg, Intravenous, Q6H PRN, Edsel Petrin, DO;  ondansetron [facility]) tablet 4 mg, 4 mg, Oral, Q6H PRN, Edsel Petrin, DO;  oxyCODONE (Oxy IR/ROXICODONE) immediate release tablet 5 mg, 5 mg, Oral, Q4H PRN, Edsel Petrin, DO, 5 mg at 10/08/11 1711 pantoprazole (PROTONIX) EC tablet 40 mg, 40 mg, Oral, Q1200, Edsel Petrin, DO, 40 mg at 10/08/11 1200;  senna-docusate (Senokot-S) tablet 1 tablet, 1 tablet, Oral, QHS PRN, Edsel Petrin, DO, 1 tablet at 10/05/11 2230;  sodium chloride 0.9 % injection 3 mL, 3 mL, Intravenous, Q12H, Edsel Petrin, DO, 3 mL at 10/08/11 2140  Precautions/Special Needs:    Additional Precautions/Restrictions: Precautions Precautions: Fall Restrictions Weight Bearing Restrictions: No Other Position/Activity Restrictions: Pt injured R shoulder in fall, L shoulder ROM also limited.  Therapy Assessments  SLP Recommendations: Equipment Recommended: Defer to next venue    Prior Function: Level of Independence: Independent with basic ADLs;Independent with gait;Independent with transfers;Needs assistance with homemaking Able to Take Stairs?: Yes Driving: Yes Vocation: Retired  Additional Prior Functional Levels:  Bed Mobility: Independent Transfers: independent Mobility - Walk/Wheelchair: independent Upper Body Dressing: independent Lower Body Dressing: independent Grooming: independent Eating/Drinking: independent Statistician: independent Bladder Continence: independent Bowel Management: independent Stair Climbing: independent Communication: independent Memory: independent Cooking/Meal Prep: wife Housework: wife Money Management: pt Driving: yes  Prior Activity Level: Limited Community (1-2x/wk): yes  ADLs/Mobility:Current Functional Level ADL Eating/Feeding: Performed;Maximal assistance Eating/Feeding Details (indicate cue type and reason): Able to perform hand to mouth slowly,  but ineffective for feeding. Where Assessed - Eating/Feeding: Chair Grooming: Performed;Wash/dry hands;Brushing hair;Moderate assistance Grooming Details (indicate cue type and reason): Unable to reach top of head with either hand. Where Assessed - Grooming: Sitting, chair Upper Body Bathing: Simulated;Maximal assistance Where Assessed - Upper Body Bathing: Unsupported;Sitting, bed Lower Body Bathing: Simulated;+1 Total assistance Where Assessed - Lower Body Bathing: Sitting, bed;Sit to stand from bed Upper Body Dressing: Performed;Maximal assistance Where Assessed - Upper Body Dressing: Sitting, bed Lower Body Dressing: Performed;+1 Total assistance Where Assessed - Lower Body Dressing: Sitting, bed;Sit to stand from bed Toilet Transfer: Simulated;Minimal assistance Toilet Transfer Method: Stand pivot (with walker to recliner) Equipment Used: Rolling walker ADL Comments: Pt's ability to perform ADL limited by soreness and decreased ROM bilateral UEs.  Bed Mobility Bed Mobility: Yes Supine to Sit: 5: Supervision;HOB elevated (Comment degrees);With rails (30, extra time) Supine to Sit Details (indicate cue type and reason): Increased time required, cues to initiate and continue movement. Assist at trunk and LE Sitting - Scoot to Edge of Bed: 4: Min assist (with pad) Sitting - Scoot to Edge of Bed Details (indicate cue type and reason): Max A wpad after pt unable to scoot with cues Transfers Transfers: Yes Sit to Stand: 4: Min assist;From elevated surface;From bed;With upper extremity assist Sit to Stand Details (indicate cue type and reason): Cues to initiate, hand palcement and assist for lifting.  Stand to Sit: 4:  Min assist;To elevated surface;With armrests;To chair/3-in-1 Stand to Sit Details: verbal and tactile cues for hand placement. increased time required Ambulation/Gait Ambulation/Gait: Yes Ambulation/Gait Assistance: 4: Min assist Ambulation/Gait Assistance Details (indicate  cue type and reason): Cues for posture and to continue stepping R/L. Pt would stop frequently or with discontinuous steps for 1-2 min, and then walk continuously for 10 seconds, and repeat, as though impaired attention to task or perseverating on obstacles. Pt did collide with obstacles x2 on L.  Ambulation Distance (Feet): 30 Feet Assistive device: Rolling walker Gait Pattern: Step-through pattern;Decreased stride length;Shuffle;Trunk flexed Stairs: No Wheelchair Mobility Wheelchair Mobility: No Posture/Postural Control Posture/Postural Control: No significant limitations Balance Balance Assessed: Yes Dynamic Sitting Balance Dynamic Sitting - Balance Support: Right upper extremity supported;Left upper extremity supported;Feet supported Dynamic Sitting - Level of Assistance: 5: Stand by assistance Dynamic Sitting Balance - Compensations: Pt unable to reach outside BOS unless cued, and then limited by UE ROM  Home Assistive Devices/Equipment:  Home Assistive Devices/Equipment Home Assistive Devices/Equipment: Cane;CBG Meter;Wheelchair;Walker (specify type);CPAP;Eyeglasses;Shower/tub chair  Discharge Planning:  Living Arrangements: Spouse/significant other Support Systems: Spouse/significant other;Family members;Church/faith community Do you have any problems obtaining your medications?: No Type of Residence: Private residence Home Care Services: No Patient expects to be discharged to:: Home Expected Discharge Date:  (ELOS 1 to 2 weeks) Case Management Consult Needed: Yes (Comment)  Previous Home Environment:  Living Arrangements: Spouse/significant other Support Systems: Spouse/significant other;Family members;Church/faith community Do you have any problems obtaining your medications?: No Type of Residence: Private residence Home Care Services: No Patient expects to be discharged to:: Home Expected Discharge Date:  (ELOS 1 to 2 weeks) Home Environment Number of Levels:  multilevel Previous Home Environment Number of Steps: 2 step entry Previous Home Environment Is Bedroom on Main Floor?: Yes Previous Home Environment Is Bathroom on Main Floor?: Yes  Discharge Living Setting:  Plans for Discharge Living Setting: Patient's home;Lives with (comment) (wife; no children) Discharge Living Setting Number of Levels: multilevel Discharge Living Setting Number of Steps: 2 step entry Discharge Living Setting is Bedroom on Main Floor?: Yes Discharge Living Setting is Bathroom on Main Floor?: Yes  Social/Family/Support Systems:  Patient Roles: Spouse Contact Information: Salam Micucci, wife Anticipated Caregiver: wife Anticipated Caregiver's Contact Information: home is [phone]; cell is [phone] Ability/Limitations of Caregiver: no limits Caregiver Availability: 24/7 Discharge Plan Discussed with Primary Caregiver: Yes Is Caregiver In Agreement with Plan?: Yes Does Caregiver/Family have Issues with Lodging/Transportation while Pt is in Rehab?: No (wife stays with him 24/7)  Goals/Additional Needs:  Patient/Family Goal for Rehab: supervision to Mod I PT, supervision to min assist OT, Mod I with SLP Pt/Family Agrees to Admission and willing to participate: Yes Program Orientation Provided & Reviewed with Pt/Caregiver Including Roles  & Responsibilities: Yes  Preadmission Screen Completed By:  Clois Dupes, 10/09/2011 10:48 AM  Patient's condition:  This patient's condition remains as documented in the Consult dated 10/09/11, in which the Rehabilitation Physician determined and documented that the patient's condition is appropriate for intensive rehabilitative care in an inpatient rehabilitation facility.  Preadmission Screen Competed by: Ottie Glazier, RN, Time/Date,1043 on 10/09/11.  Discussed status with Dr. Riley Kill on 10/09/11 at 1048 and received telephone approval for admission today.  Admission Coordinator:  Clois Dupes, time [id number]  Date 10/09/11.
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
## 2011-10-09 NOTE — Progress Notes (Signed)
I await OT assessment so that I can pursue [facility] approval for an inpatient acute rehab admission. I have contacted the therapy department with this request. Please call me for any questions. Pager [phone].

## 2011-10-09 NOTE — Progress Notes (Signed)
Radiologist called to transport pt down to MRI, but pt was very tired and requested to postpone procedure till am.

## 2011-10-09 NOTE — Progress Notes (Signed)
   CARE MANAGEMENT NOTE 10/09/2011  Patient:  Joshua Glass, Joshua Glass   Account Number:  [id number]  Date Initiated:  10/09/2011  Documentation initiated by:  Onnie Boer  Subjective/Objective Assessment:   PT WAS ADMITTIED WITH SDH     Action/Plan:   PROGRESSION OF CARE AND DISCHARGE PLANNNING   Anticipated DC Date:  10/09/2011   Anticipated DC Plan:  IP REHAB FACILITY  In-house referral  Clinical Social Worker      DC Associate Professor  CM consult      Choice offered to / List presented to:             Status of service:  Completed, signed off Medicare Important Message given?   (If response is "NO", the following Medicare IM given date fields will be blank) Date Medicare IM given:   Date Additional Medicare IM given:    Discharge Disposition:  IP REHAB FACILITY  Per UR Regulation:    If discussed at Long Length of Stay Meetings, dates discussed:    Comments:  10/09/11 Onnie Boer, RN, BSN 1500 PT WAS ADMITTED WITH SDH.  PTA PT WAS AT HOME WITH SELF CARE.  RECOMMENDATION FROM PT/OT IS [facility].  [facility] HAS REVIEWED AND ACCEPTED PT.  PT IS TO GO TO CIR TODAY.

## 2011-10-10 ENCOUNTER — Encounter (HOSPITAL_COMMUNITY): Payer: Self-pay | Admitting: Physical Medicine and Rehabilitation

## 2011-10-10 DIAGNOSIS — E1142 Type 2 diabetes mellitus with diabetic polyneuropathy: Secondary | ICD-10-CM

## 2011-10-10 DIAGNOSIS — K8020 Calculus of gallbladder without cholecystitis without obstruction: Secondary | ICD-10-CM | POA: Insufficient documentation

## 2011-10-10 DIAGNOSIS — M7530 Calcific tendinitis of unspecified shoulder: Secondary | ICD-10-CM

## 2011-10-10 DIAGNOSIS — S069XAA Unspecified intracranial injury with loss of consciousness status unknown, initial encounter: Secondary | ICD-10-CM

## 2011-10-10 DIAGNOSIS — W108XXA Fall (on) (from) other stairs and steps, initial encounter: Secondary | ICD-10-CM

## 2011-10-10 DIAGNOSIS — Z5189 Encounter for other specified aftercare: Secondary | ICD-10-CM

## 2011-10-10 DIAGNOSIS — S069X9A Unspecified intracranial injury with loss of consciousness of unspecified duration, initial encounter: Secondary | ICD-10-CM

## 2011-10-10 DIAGNOSIS — M75121 Complete rotator cuff tear or rupture of right shoulder, not specified as traumatic: Secondary | ICD-10-CM

## 2011-10-10 LAB — CBC
HCT: 39.8 % (ref 39.0–52.0)
Hemoglobin: 13.6 g/dL (ref 13.0–17.0)
MCH: 29.6 pg (ref 26.0–34.0)
MCHC: 34.2 g/dL (ref 30.0–36.0)
MCV: 86.5 fL (ref 78.0–100.0)
RDW: 14.4 % (ref 11.5–15.5)

## 2011-10-10 LAB — DIFFERENTIAL
Basophils Relative: 1 % (ref 0–1)
Eosinophils Absolute: 0.6 10*3/uL (ref 0.0–0.7)
Lymphs Abs: 2.9 10*3/uL (ref 0.7–4.0)
Monocytes Relative: 8 % (ref 3–12)
Neutro Abs: 5.8 10*3/uL (ref 1.7–7.7)
Neutrophils Relative %: 58 % (ref 43–77)

## 2011-10-10 LAB — GLUCOSE, CAPILLARY: Glucose-Capillary: 191 mg/dL — ABNORMAL HIGH (ref 70–99)

## 2011-10-10 LAB — COMPREHENSIVE METABOLIC PANEL
Albumin: 2.8 g/dL — ABNORMAL LOW (ref 3.5–5.2)
Alkaline Phosphatase: 65 U/L (ref 39–117)
BUN: 16 mg/dL (ref 6–23)
Creatinine, Ser: 0.83 mg/dL (ref 0.50–1.35)
GFR calc Af Amer: >90 mL/min (ref >90)
Glucose, Bld: 152 mg/dL — ABNORMAL HIGH (ref 70–99)
Total Protein: 7 g/dL (ref 6.0–8.3)

## 2011-10-10 NOTE — Progress Notes (Signed)
Patient ID: ANTONINO NIENHUIS, male   DOB: 05-Dec-1932, 78 y.o.   MRN: [id number] Subjective/Complaints: Review of Systems  Respiratory: Negative for shortness of breath.   Cardiovascular: Negative for chest pain.  Musculoskeletal: Positive for back pain and joint pain.  Neurological: Positive for sensory change.  All other systems reviewed and are negative.     Objective: Vital Signs: Blood pressure 165/75, pulse 95, temperature 98.7 F (37.1 C), temperature source Oral, resp. rate 20, SpO2 96.00%. Mr Shoulder Right Wo Contrast  10/09/2011  *RADIOLOGY REPORT*  Clinical Data: Status post fall.  Shoulder pain.  MRI OF THE RIGHT SHOULDER WITHOUT CONTRAST  Technique:  Multiplanar, multisequence MR imaging was performed. No intravenous contrast was administered.  Comparison: None.  Findings: There is no fracture or dislocation.  The supraspinatus, infraspinatus and subscapularis tendons are all completely torn. The supraspinatus and infraspinatus are retracted to the level of the glenoid, 5-6 cm.  There is atrophy of all three muscle bellies, worst in the supraspinatus.  Two large calcifications are seen in the supraspinatus measuring 2.1 and 1.0 cm in diameter.  Fluid tracks into both muscle bellies.  The teres minor is intact.  The long head of biceps tendon is completely torn.  The labrum is diffusely torn with severe glenohumeral degenerative change present.  Large osteophyte off the humeral head is noted.  There is acromioclavicular degenerative change and the joint appears widened possibly due to old trauma.  The humerus abuts the undersurface of the acromion which appears remodeled with subchondral edema.  Loose bodies are identified in the glenohumeral joint measuring up to 0.9 cm in diameter. A large volume of fluid is present in the subacromial/subdeltoid bursa.  IMPRESSION:  1.  Complete supraspinatus, infraspinatus and subscapularis tendon tears with marked retraction and atrophy as described  above. 2.  Severe glenohumeral degenerative change with diffuse tearing of the labrum and loose bodies in the joint. 3.  Large volume of fluid in the subacromial/subdeltoid bursa consistent with bursitis. 4.  Acromioclavicular degenerative change. The joint appears widened, possibly due to old trauma. 5.  Negative for evidence of acute or subacute trauma.  Original Report Authenticated By: Bernadene Bell. Maricela Curet, M.D.    Basename 10/10/11 0618  WBC 10.0  HGB 13.6  HCT 39.8  PLT 259    Basename 10/10/11 0618  NA 137  K 4.1  CL 100  CO2 28  GLUCOSE 152*  BUN 16  CREATININE 0.83  CALCIUM 8.8   CBG (last 3)   Basename 10/10/11 0721 10/09/11 2104 10/09/11 1612  GLUCAP 136* 172* 172*    Wt Readings from Last 3 Encounters:  10/07/11 107.502 kg (237 lb)  03/29/11 115.939 kg (255 lb 9.6 oz)  03/30/10 113.912 kg (251 lb 2.1 oz)    Physical Exam:  General appearance: alert, cooperative and no distress Head: Normocephalic, without obvious abnormality, atraumatic Eyes: conjunctivae/corneas clear. PERRL, EOM's intact. Fundi benign. Ears: normal TM's and external ear canals both ears Nose: Nares normal. Septum midline. Mucosa normal. No drainage or sinus tenderness. Throat: lips, mucosa, and tongue normal; teeth and gums normal Neck: no adenopathy, no carotid bruit, no JVD, supple, symmetrical, trachea midline and thyroid not enlarged, symmetric, no tenderness/mass/nodules Back: symmetric, no curvature. ROM normal. No CVA tenderness. Resp: clear to auscultation bilaterally Cardio: regular rate and rhythm, S1, S2 normal, no murmur, click, rub or gallop GI: soft, non-tender; bowel sounds normal; no masses,  no organomegaly Extremities: extremities normal, atraumatic, no cyanosis or edema Pulses: 2+ and  symmetric Skin: Skin color, texture, turgor normal. No rashes or lesions Neurologic: decreased peripheral sense to PP and LT. Shuffling gait. Speech a little dysarthric but no focal CN  findings. Vision slightly impaired. RUE motor scores affected by pain. He has generalized prox greater than distal weakness, right slightly weaker than left.  Cognitively, this patient has fair insight and awareness. Incision/Wound: a few bruises and abrasions noted.   Assessment/Plan: 1. 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 Physiatrist is providing close team supervision and 24 hour management of active medical problems listed below. Physiatrist and rehab team continue to assess barriers to discharge/monitor patient progress toward functional and medical goals. FIM:       FIM - Toileting Toileting Assistive Devices: Grab bar or rail for support Toileting: 1: Total-Patient completed zero steps, helper did all 3  FIM - Archivist Transfers: 1-Two helpers        Comprehension Comprehension Mode: Auditory Comprehension: 6-Follows complex conversation/direction: With extra time/assistive device  Expression Expression Mode: Verbal Expression: 6-Expresses complex ideas: With extra time/assistive device  Social Interaction Social Interaction: 6-Interacts appropriately with others with medication or extra time (anti-anxiety, antidepressant).  Problem Solving Problem Solving: 6-Solves complex problems: With extra time  Memory Memory: 6-More than reasonable amt of time  1. DVT Prophylaxis/Anticoagulation: Mechanical: Sequential compression devices, below knee Bilateral lower extremities  2. Pain Management: hydrocodone trial scheduled prior to therapy sessions to help with pain management and participation of therapies.  3. Mood: pleasant and motivated.  SW follow up for formal evaluation.  4. DM type 2: Will monitor with AC/HS CBG checks and titrate medication for better control. Continue levemir but increase to bid for  consistent coverage. Sugars are showing improvement Continue meal coverage as po intake good per wife.  5. HTN: Monitor with bid checks. Continue Cozaar. Off Maxzide currently. May need to resume if BP continues to be elevated.  6. Bilateral rotator cuff pathology: likely acute on chronic picture. MRI right shoulder with chronic RTC tear and bursitis. Flexor patch ordered with improvement in pain relief. Consider injection depending on activity tolerance, etc. 7. Severe multilevel cervical stenosis: Symptoms likely worsened due to fall. Will attempt pain management as tolerated and therapy to help with mobility.  8. Diabetic neuropathy: no complaints of pain but contributing to premorbid gait disorder  LOS (Days) 1 A FACE TO FACE EVALUATION WAS PERFORMED  Adina Puzzo T 10/10/2011, 8:09 AM
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
## 2011-10-10 NOTE — Progress Notes (Addendum)
Inpatient Rehabilitation Center Individual Statement of Services  Patient Name:  Joshua Glass  Date:  10/10/2011  Welcome to the [facility]patient [facility].  Our goal is to provide you with an individualized program based on your diagnosis and situation, designed to meet your specific needs.  With this comprehensive rehabilitation program, you will be expected to participate in at least 3 hours of rehabilitation therapies Monday-Friday, with modified therapy programming on the weekends.  Your rehabilitation program will include the following services:  Physical Therapy (PT), Occupational Therapy (OT), Speech Therapy (ST), 24 hour per day rehabilitation nursing, Therapeutic Recreaction (TR), Case Management (RN and Child psychotherapist), Rehabilitation Medicine, Nutrition Services and Pharmacy Services  Weekly team conferences will be held on  Tuesday  to discuss your progress.  Your RN Case Designer, television/film set will talk with you frequently to get your input and to update you on team discussions.  Team conferences with you and your family in attendance may also be held.  Expected length of stay: 7 days      Overall anticipated outcome: Modified Independent-Supervision  Depending on your progress and recovery, your program may change.  Your RN Case Estate agent will coordinate services and will keep you informed of any changes.  Your RN Sports coach and SW names and contact numbers are listed  below.  The following services may also be recommended but are not provided by the Inpatient Rehabilitation Center:   Driving Evaluations  Home Health Rehabiltiation Services  Outpatient Rehabilitatation [facility]  Vocational Rehabilitation   Arrangements will be made to provide these services after discharge if needed.  Arrangements include referral to agencies that provide these services.  Your insurance has been verified to be:  [place] Your primary doctor is:  Dr. Zenaida Deed  Pertinent information will be shared with your doctor and your insurance company.  Case Manager: Melanee Spry, [facility] [phone]  Social Worker:  Amada [place], [place] [phone]  Information discussed with pt & his wife & copy given to them by: Brock Ra, 10/10/2011, 10:03 AM
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
## 2011-10-10 NOTE — Progress Notes (Signed)
Patient information reviewed and entered into [facility] system by Issis Lindseth, RN, CRRN, PPS Coordinator.  Information including medical coding and functional independence measure will be reviewed and updated through discharge.     Per nursing patient was given "Data Collection Information Summary for Patients in Inpatient Rehabilitation Facilities with attached "Privacy Act Statement-Health Care Records" upon admission.   

## 2011-10-10 NOTE — Progress Notes (Addendum)
Recreational Therapy Assessment and Plan  Patient Details  Name: Joshua Glass MRN: [id number] Date of Birth: [birth year] Today's Date: 10/10/2011  Rehab Potential: Excellent ELOS: 5-7 days   Assessment Clinical Impression: Joshua Glass is an 78 y.o. male with history of DM, neuropathy who was admitted 03/16 past being found on the floor past falling backwards down several concrete stairs. Found unconscious by wife with amnesia of events- legs on stairs and head/shoulders on floor. Patient with gait problems and cause of fall questioned due to neuropathy v/s syncope. CT brain with small left interhemispheric acute SDH. Evaluated by Dr. Jule Ser and who recommended monitoring with repeat CT. 2 D echoPatient also noted to have advance multilevel cervical spondylosis with DDD at every cervical level and MRI C-spine ordered by Dr. Jule Ser. Patient with Severe right shoulder pain and some mild radicular symptoms due to fall. MRI of right shoulder with complete RTC tear (chronic) and evidence of bursitis. Follow with NS on outpatient basis to decide about surgical options. Therapies initiated and patient noted to have problems with mobility and self care tasks. Pt transferred to CIR on 10/09/11.  Pt presents with decreased activity tolerance, decreased functional mobility, decreased balance limiting pt's independence with leisure/community pursuits. Leisure History/Participation Premorbid leisure interest/current participation: Ashby Dawes - Retail buyer;Nature - Flower gardening;Community - Shopping mall;Games - Cards;Sports - Baseball;Sports - Basketball;Nature - Fishing;Nature - Hunting;Community - Travel (Comment);Harold Barban care (used to play [employer]) Leisure Participation Style: With Family/Friends Awareness of Community Resources: Excellent Psychosocial / Spiritual Spiritual Interests: Church Does patient have pets?: No (feed wild cats around the house) Social  interaction - Mood/Behavior: Cooperative Firefighter Appropriate for Education?: Yes Recreational Therapy Orientation Orientation -Reviewed with patient: Available activity resources Strengths/Weaknesses Patient Strengths/Abilities: Willingness to participate;Active premorbidly Patient weaknesses: Physical limitations  Plan Rec Therapy Plan Is patient appropriate for Therapeutic Recreation?: Yes Rehab Potential: Excellent Treatment times per week: min 1 time per week > TR Treatment/Interventions: Adaptive equipment instruction;1:1 session;Balance/vestibular training;Community reintegration;Functional mobility training;Patient/family education;Recreation/leisure participation;Therapeutic activities  Recommendations for other services: None  Discharge Criteria: Patient will be discharged from TR if patient refuses treatment 3 consecutive times without medical reason.  If treatment goals not met, if there is a change in medical status, if patient makes no progress towards goals or if patient is discharged from hospital.  The above assessment, treatment plan, treatment alternatives and goals were discussed and mutually agreed upon: by patient  Joshua Glass 10/10/2011, 11:12 AM
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
## 2011-10-11 LAB — GLUCOSE, CAPILLARY
Glucose-Capillary: 150 mg/dL — ABNORMAL HIGH (ref 70–99)
Glucose-Capillary: 173 mg/dL — ABNORMAL HIGH (ref 70–99)

## 2011-10-11 MED ORDER — DICLOFENAC SODIUM 1 % TD GEL
Freq: Three times a day (TID) | TRANSDERMAL | Status: DC
  Administered 2011-10-11 – 2011-10-16 (×15): via TOPICAL
  Filled 2011-10-11: qty 100

## 2011-10-11 NOTE — Progress Notes (Signed)
Occupational Therapy Session Note  Patient Details  Name: KRUZE ATCHLEY MRN: [id number] Date of Birth: [birth year]  Today's Date: 10/11/2011 Time: 0700-0810 Time Calculation (min): 70 min  Short Term Goals: Week 1:  OT Short Term Goal 1 (Week 1): Pt would peform tub shower transfer with supervision with appropriate DME OT Short Term Goal 2 (Week 1): Pt will don shirt with supervision OT Short Term Goal 3 (Week 1): Pt perform grooming standing mod I OT Short Term Goal 4 (Week 1): pt will don LB clothing with supervision OT Short Term Goal 5 (Week 1): Pt will demonstrate anticipatory awareness with ADL home making tasks with quesitoning cues  Skilled Therapeutic Interventions/Progress Updates:    ADL retraining including bathing at shower level and dressing seated with sit to stand from w/c at sink.  Pt amb to BR with HHA for toileting and shower transfer.  Pt using grab bars for balance when standing.  Pt amb to w/c at sink for dressing.  Pt required min verbal cues to lock brakes on w/c when standing.  Pt required increased time to complete all tasks with min verbal cues for safety awareness.  Focus on activity tolerance and safety awareness.  Therapy Documentation Precautions:  Precautions Precautions: Fall Required Braces or Orthoses: No Restrictions Weight Bearing Restrictions: No General:   Pain: Pain Assessment Pain Assessment: No/denies pain  See FIM for current functional status  Therapy/Group: Individual Therapy  Rich Brave 10/11/2011, 8:17 AM

## 2011-10-11 NOTE — Progress Notes (Signed)
Physical Therapy Note  Patient Details  Name: Joshua Glass MRN: [id number] Date of Birth: 18-Nov-1932 Today's Date: 10/11/2011  8:35-9:35 individual therapy pt complained of mild pain in rt side of neck. Pain medication and hot pack applied to decrease stiffness.  Gait training into bathroom min assist. Gait training to and from gym min assist including head turns, sudden stops, change in cadence, and turns. Pt stated turns occasionally make him dizzy. Pt with instability with head turns mostly to left. All min assist.  Performed cervical ROM after hot pack to decrease stiffness.  Performed heel to e exercise x 10 and stepping over and backwards over board to practice SLS. Min asssit   [name] 10/11/2011, 9:53 AM

## 2011-10-11 NOTE — Progress Notes (Signed)
Patient ID: Joshua Glass, male   DOB: 05-Dec-1932, 78 y.o.   MRN: [id number] Patient ID: Joshua Glass, male   DOB: 1932/07/26, 78 y.o.   MRN: [id number] Subjective/Complaints: Review of Systems  Respiratory: Negative for shortness of breath.   Cardiovascular: Negative for chest pain.  Musculoskeletal: Positive for back pain and joint pain.  Neurological: Positive for sensory change.  All other systems reviewed and are negative.  3/22--right shoulder tender but improved. Likes flector patches   Objective: Vital Signs: Blood pressure 146/80, pulse 65, temperature 98.5 F (36.9 C), temperature source Oral, resp. rate 20, SpO2 92.00%. Mr Shoulder Right Wo Contrast  10/09/2011  *RADIOLOGY REPORT*  Clinical Data: Status post fall.  Shoulder pain.  MRI OF THE RIGHT SHOULDER WITHOUT CONTRAST  Technique:  Multiplanar, multisequence MR imaging was performed. No intravenous contrast was administered.  Comparison: None.  Findings: There is no fracture or dislocation.  The supraspinatus, infraspinatus and subscapularis tendons are all completely torn. The supraspinatus and infraspinatus are retracted to the level of the glenoid, 5-6 cm.  There is atrophy of all three muscle bellies, worst in the supraspinatus.  Two large calcifications are seen in the supraspinatus measuring 2.1 and 1.0 cm in diameter.  Fluid tracks into both muscle bellies.  The teres minor is intact.  The long head of biceps tendon is completely torn.  The labrum is diffusely torn with severe glenohumeral degenerative change present.  Large osteophyte off the humeral head is noted.  There is acromioclavicular degenerative change and the joint appears widened possibly due to old trauma.  The humerus abuts the undersurface of the acromion which appears remodeled with subchondral edema.  Loose bodies are identified in the glenohumeral joint measuring up to 0.9 cm in diameter. A large volume of fluid is present in the subacromial/subdeltoid bursa.   IMPRESSION:  1.  Complete supraspinatus, infraspinatus and subscapularis tendon tears with marked retraction and atrophy as described above. 2.  Severe glenohumeral degenerative change with diffuse tearing of the labrum and loose bodies in the joint. 3.  Large volume of fluid in the subacromial/subdeltoid bursa consistent with bursitis. 4.  Acromioclavicular degenerative change. The joint appears widened, possibly due to old trauma. 5.  Negative for evidence of acute or subacute trauma.  Original Report Authenticated By: Bernadene Bell. Maricela Curet, M.D.    Basename 10/10/11 0618  WBC 10.0  HGB 13.6  HCT 39.8  PLT 259    Basename 10/10/11 0618  NA 137  K 4.1  CL 100  CO2 28  GLUCOSE 152*  BUN 16  CREATININE 0.83  CALCIUM 8.8   CBG (last 3)   Basename 10/10/11 2108 10/10/11 1723 10/10/11 1148  GLUCAP 191* 131* 272*    Wt Readings from Last 3 Encounters:  10/07/11 107.502 kg (237 lb)  03/29/11 115.939 kg (255 lb 9.6 oz)  03/30/10 113.912 kg (251 lb 2.1 oz)    Physical Exam:  General appearance: alert, cooperative and no distress Head: Normocephalic, without obvious abnormality, atraumatic Eyes: conjunctivae/corneas clear. PERRL, EOM's intact. Fundi benign. Ears: normal TM's and external ear canals both ears Nose: Nares normal. Septum midline. Mucosa normal. No drainage or sinus tenderness. Throat: lips, mucosa, and tongue normal; teeth and gums normal Neck: no adenopathy, no carotid bruit, no JVD, supple, symmetrical, trachea midline and thyroid not enlarged, symmetric, no tenderness/mass/nodules Back: symmetric, no curvature. ROM normal. No CVA tenderness. Resp: clear to auscultation bilaterally Cardio: regular rate and rhythm, S1, S2 normal, no murmur, click, rub or  gallop GI: soft, non-tender; bowel sounds normal; no masses,  no organomegaly Extremities: extremities normal, atraumatic, no cyanosis or edema Pulses: 2+ and symmetric Skin: Skin color, texture, turgor normal. No  rashes or lesions Neurologic: decreased peripheral sense to PP and LT. Shuffling gait. Speech quite clear. Vision slightly impaired. RUE motor scores affected by pain. He has generalized prox greater than distal weakness, right slightly weaker than left.  Cognitively, this patient has reasonable insight and awareness. Incision/Wound: a few bruises and abrasions noted. Musc:right rotator cuff with weakness in abd and ER/IR.  Tolerated PROM fairly well.   Assessment/Plan: 1. 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 Physiatrist is providing close team supervision and 24 hour management of active medical problems listed below. Physiatrist and rehab team continue to assess barriers to discharge/monitor patient progress toward functional and medical goals. FIM: FIM - Bathing Bathing Steps Patient Completed: Chest;Right Arm;Left Arm;Abdomen;Right upper leg;Left upper leg;Left lower leg (including foot);Right lower leg (including foot);Front perineal area Bathing: 4: Min-Patient completes 8-9 93f 10 parts or 75+ percent  FIM - Upper Body Dressing/Undressing Upper body dressing/undressing steps patient completed: Thread/unthread left sleeve of pullover shirt/dress;Thread/unthread right sleeve of pullover shirt/dresss;Pull shirt over trunk Upper body dressing/undressing: 4: Min-Patient completed 75 plus % of tasks FIM - Lower Body Dressing/Undressing Lower body dressing/undressing steps patient completed: Thread/unthread right underwear leg;Thread/unthread left underwear leg;Don/Doff left sock;Don/Doff right shoe Lower body dressing/undressing: 3: Mod-Patient completed 50-74% of tasks  FIM - Toileting Toileting steps completed by patient: Adjust clothing prior to toileting;Performs perineal hygiene Toileting Assistive Devices: Grab bar or rail for  support Toileting: 3: Mod-Patient completed 2 of 3 steps  FIM - Archivist Transfers: 1-Two helpers  FIM - Games developer Transfer: 4: Bed > Chair or W/C: Min A (steadying Pt. > 75%)  FIM - Locomotion: Wheelchair Locomotion: Wheelchair: 0: Activity did not occur FIM - Locomotion: Ambulation Locomotion: Ambulation Assistive Devices: Designer, industrial/product Ambulation/Gait Assistance: 4: Min assist Locomotion: Ambulation: 2: Travels 50 - 149 ft with minimal assistance (Pt.>75%)  Comprehension Comprehension Mode: Auditory Comprehension: 5-Understands complex 90% of the time/Cues < 10% of the time  Expression Expression Mode: Verbal Expression: 6-Expresses complex ideas: With extra time/assistive device  Social Interaction Social Interaction: 6-Interacts appropriately with others with medication or extra time (anti-anxiety, antidepressant).  Problem Solving Problem Solving: 5-Solves basic 90% of the time/requires cueing < 10% of the time  Memory Memory: 5-Recognizes or recalls 90% of the time/requires cueing < 10% of the time  1. DVT Prophylaxis/Anticoagulation: Mechanical: Sequential compression devices, below knee Bilateral lower extremities  2. Pain Management: hydrocodone trial scheduled prior to therapy sessions to help with pain management and participation of therapies.  3. Mood: pleasant and motivated.  SW follow up for formal evaluation.  4. DM type 2: Will monitor with AC/HS CBG checks and titrate medication for better control. levemir increased but he may need further adjustment.  Sugars are showing improvement in general however.  Continue meal coverage as po intake good per wife.  5. HTN: Monitor with bid checks. Continue Cozaar. Off Maxzide currently. May need to resume if BP continues to be elevated.  6. Bilateral rotator cuff pathology: likely acute on chronic picture. MRI right shoulder with chronic RTC tear and bursitis.Pain improving. He likes  flector, but it's unlikely that this will be covered in the outpt setting. Will switch to voltaren  gel. 7. Severe multilevel cervical stenosis: Symptoms likely worsened due to fall. He has some neck pain which is mild in comparison to his shoulder.  Sx are most likely from whip lash injury in the setting of his spondylosis. He doesn't have any UMN signs on exam.  8. Diabetic neuropathy: no complaints of pain but contributing to premorbid gait disorder  LOS (Days) 2 A FACE TO FACE EVALUATION WAS PERFORMED  Brynja Marker T 10/11/2011, 6:51 AM

## 2011-10-11 NOTE — Progress Notes (Signed)
Speech Language Pathology Daily Session Note  Patient Details  Name: Joshua Glass MRN: [id number] Date of Birth: Feb 26, 1933  Today's Date: 10/11/2011 Time: 1330-1430 Time Calculation (min): 60 min  Short Term Goals:  SLP Short Term Goal 1 (Week 1): Pt will demonstrate complex problem solving with Mod I SLP Short Term Goal 2 (Week 1): Pt will recall new, daily information with Mod I.  SLP Short Term Goal 3 (Week 1): Pt will demonstrate divided attention between two tasks with Mod I for ~60 minutes.   Skilled Therapeutic Interventions: Treatment focus on new learning of complex card game "blink." Pt independently recalled rules of game throughout session and required supervision questioning cues for complex problem solving and organization of game. Wife present during session and reports she feels pt is at cognitive baseline.   Daily Session FIM:  Comprehension Comprehension Mode: Auditory Comprehension: 5-Understands complex 90% of the time/Cues < 10% of the time Expression Expression Mode: Verbal Expression: 6-Expresses complex ideas: With extra time/assistive device Social Interaction Social Interaction: 6-Interacts appropriately with others with medication or extra time (anti-anxiety, antidepressant). Problem Solving Problem Solving: 5-Solves complex 90% of the time/cues < 10% of the time Memory Memory: 5-Recognizes or recalls 90% of the time/requires cueing < 10% of the time Pain Pain Assessment Pain Assessment: No/denies pain  Therapy/Group: Individual Therapy  Antonette Hendricks 10/11/2011, 5:47 PM

## 2011-10-12 LAB — GLUCOSE, CAPILLARY
Glucose-Capillary: 146 mg/dL — ABNORMAL HIGH (ref 70–99)
Glucose-Capillary: 154 mg/dL — ABNORMAL HIGH (ref 70–99)
Glucose-Capillary: 128 mg/dL — ABNORMAL HIGH (ref 70–99)

## 2011-10-12 NOTE — Progress Notes (Signed)
Patient ID: Joshua Glass, male   DOB: 13-May-1933, 78 y.o.   MRN: [id number] Patient ID: Joshua Glass, male   DOB: [birth year], 78 y.o.   MRN: [id number] Patient ID: Joshua Glass, male   DOB: 09/07/1932, 78 y.o.   MRN: [id number] Subjective/Complaints: Review of Systems  Respiratory: Negative for shortness of breath.   Cardiovascular: Negative for chest pain.  Musculoskeletal: Positive for back pain and joint pain.  Neurological: Positive for sensory change.  All other systems reviewed and are negative.  3/22--right shoulder tender but improved. Alert and in good spirits; Up OOB into chair;  No c/os; nice glycemic control Exam  oropharnyx clear; chest-clear; CV  Regular no tachy;  abd- obese soft; extr- no edema   Objective: Vital Signs: Blood pressure 150/74, pulse 80, temperature 98.1 F (36.7 C), temperature source Oral, resp. rate 20, SpO2 97.00%. No results found.  Basename 10/10/11 0618  WBC 10.0  HGB 13.6  HCT 39.8  PLT 259    Basename 10/10/11 0618  NA 137  K 4.1  CL 100  CO2 28  GLUCOSE 152*  BUN 16  CREATININE 0.83  CALCIUM 8.8   CBG (last 3)   Basename 10/12/11 0746 10/11/11 2057 10/11/11 1603  GLUCAP 146* 172* 187*    Wt Readings from Last 3 Encounters:  10/07/11 107.502 kg (237 lb)  03/29/11 115.939 kg (255 lb 9.6 oz)  03/30/10 113.912 kg (251 lb 2.1 oz)    Physical Exam:  General appearance: alert, cooperative and no distress Head: Normocephalic, without obvious abnormality, atraumatic Eyes: conjunctivae/corneas clear. PERRL, EOM's intact. Fundi benign. Ears: normal TM's and external ear canals both ears Nose: Nares normal. Septum midline. Mucosa normal. No drainage or sinus tenderness. Throat: lips, mucosa, and tongue normal; teeth and gums normal Neck: no adenopathy, no carotid bruit, no JVD, supple, symmetrical, trachea midline and thyroid not enlarged, symmetric, no tenderness/mass/nodules Back: symmetric, no curvature. ROM normal. No CVA  tenderness. Resp: clear to auscultation bilaterally Cardio: regular rate and rhythm, S1, S2 normal, no murmur, click, rub or gallop GI: soft, non-tender; bowel sounds normal; no masses,  no organomegaly Extremities: extremities normal, atraumatic, no cyanosis or edema Pulses: 2+ and symmetric Skin: Skin color, texture, turgor normal. No rashes or lesions Neurologic: decreased peripheral sense to PP and LT. Shuffling gait. Speech quite clear. Vision slightly impaired. RUE motor scores affected by pain. He has generalized prox greater than distal weakness, right slightly weaker than left.  Cognitively, this patient has reasonable insight and awareness. Incision/Wound: a few bruises and abrasions noted. Musc:right rotator cuff with weakness in abd and ER/IR.  Tolerated PROM fairly well.   Assessment/Plan: 1. 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 Physiatrist is providing close team supervision and 24 hour management of active medical problems listed below. Physiatrist and rehab team continue to assess barriers to discharge/monitor patient progress toward functional and medical goals. FIM: FIM - Bathing Bathing Steps Patient Completed: Chest;Right Arm;Left Arm;Abdomen;Front perineal area;Buttocks;Right upper leg;Left upper leg;Left lower leg (including foot);Right lower leg (including foot) Bathing: 5: Supervision: Safety issues/verbal cues  FIM - Upper Body Dressing/Undressing Upper body dressing/undressing steps patient completed: Thread/unthread right sleeve of pullover shirt/dresss;Thread/unthread left sleeve of pullover shirt/dress;Put head through opening of pull over shirt/dress;Pull shirt over trunk Upper body dressing/undressing: 5: Set-up assist to: Obtain clothing/put away FIM - Lower Body Dressing/Undressing Lower body dressing/undressing  steps  patient completed: Thread/unthread right underwear leg;Thread/unthread left underwear leg;Pull underwear up/down;Thread/unthread right pants leg;Thread/unthread left pants leg;Pull pants up/down;Fasten/unfasten pants;Don/Doff right sock;Don/Doff left sock Lower body dressing/undressing: 5: Set-up assist to: Obtain clothing  FIM - Toileting Toileting steps completed by patient: Adjust clothing prior to toileting;Performs perineal hygiene;Adjust clothing after toileting Toileting Assistive Devices: Grab bar or rail for support Toileting: 4: Steadying assist  FIM - Diplomatic Services operational officer Devices: Elevated toilet seat Toilet Transfers: 4-To toilet/BSC: Min A (steadying Pt. > 75%);4-From toilet/BSC: Min A (steadying Pt. > 75%)  FIM - Bed/Chair Transfer Bed/Chair Transfer Assistive Devices: Bed rails Bed/Chair Transfer: 4: Supine > Sit: Min A (steadying Pt. > 75%/lift 1 leg);4: Bed > Chair or W/C: Min A (steadying Pt. > 75%);4: Chair or W/C > Bed: Min A (steadying Pt. > 75%)  FIM - Locomotion: Wheelchair Locomotion: Wheelchair: 0: Activity did not occur FIM - Locomotion: Ambulation Locomotion: Ambulation Assistive Devices: Designer, industrial/product Ambulation/Gait Assistance: 4: Min assist Locomotion: Ambulation: 4: Travels 150 ft or more with minimal assistance (Pt.>75%)  Comprehension Comprehension Mode: Auditory Comprehension: 5-Understands complex 90% of the time/Cues < 10% of the time  Expression Expression Mode: Verbal Expression: 6-Expresses complex ideas: With extra time/assistive device  Social Interaction Social Interaction: 6-Interacts appropriately with others with medication or extra time (anti-anxiety, antidepressant).  Problem Solving Problem Solving: 5-Solves complex 90% of the time/cues < 10% of the time  Memory Memory: 5-Recognizes or recalls 90% of the time/requires cueing < 10% of the time  1. DVT Prophylaxis/Anticoagulation: Mechanical:  Sequential compression devices, below knee Bilateral lower extremities  2. Pain Management: hydrocodone trial scheduled prior to therapy sessions to help with pain management and participation of therapies.  3. Mood: pleasant and motivated.  SW follow up for formal evaluation.  4. DM type 2: Will monitor with AC/HS CBG checks and titrate medication for better control. levemir increased but he may need further adjustment.  Sugars are showing improvement in general however.  Continue meal coverage as po intake good per wife.  5. HTN: Monitor with bid checks. Continue Cozaar. Off Maxzide currently. May need to resume if BP continues to be elevated.  6. Bilateral rotator cuff pathology: likely acute on chronic picture. MRI right shoulder with chronic RTC tear and bursitis.Pain improving. He likes flector, but it's unlikely that this will be covered in the outpt setting. Will switch to voltaren gel. 7. Severe multilevel cervical stenosis: Symptoms likely worsened due to fall. He has some neck pain which is mild in comparison to his shoulder.  Sx are most likely from whip lash injury in the setting of his spondylosis. He doesn't have any UMN signs on exam.  8. Diabetic neuropathy: no complaints of pain but contributing to premorbid gait disorder  LOS (Days) 3 A FACE TO FACE EVALUATION WAS PERFORMED  Rogelia Boga 10/12/2011, 9:10 AM

## 2011-10-12 NOTE — Progress Notes (Signed)
Occupational Therapy Session Note  Patient Details  Name: Joshua Glass MRN: [id number] Date of Birth: 12/08/1932  Today's Date: 10/12/2011 Time: 1100-1200 Time Calculation (min): 60 min  Short Term Goals: Week 1:  OT Short Term Goal 1 (Week 1): Pt would peform tub shower transfer with supervision with appropriate DME OT Short Term Goal 2 (Week 1): Pt will don shirt with supervision OT Short Term Goal 3 (Week 1): Pt perform grooming standing mod I OT Short Term Goal 4 (Week 1): pt will don LB clothing with supervision OT Short Term Goal 5 (Week 1): Pt will demonstrate anticipatory awareness with ADL home making tasks with quesitoning cues  Skilled Therapeutic Interventions: Therapeutic activities with emphasis on dynamic standing balance, weight-shifting, trunk rotation and use of LE during activity, endurance, body awareness (BMI), and use of recreational activities to improve balance at home [facility] system).  Patient reported that he had bathed and dressed, with min assist from spouse, prior to planned ADL re-training session.   He reports awareness of lower extremity weakness and balance deficits as major obstacle to complete recovery.  Patient stated he won a Wii system but gave it to his nephew due to not knowing what it was or how to use it.  Patient enjoyed demo of Wii and completed 1 bowling game and 1 trial of Wii balance board.    Therapy Documentation Precautions:  Precautions Precautions: Fall Required Braces or Orthoses: No Restrictions Weight Bearing Restrictions: No  Pain: Pain Assessment Pain Score: 0-No pain  See FIM for current functional status  Therapy/Group: Individual Therapy  Georgeanne Nim 10/12/2011, 12:49 PM

## 2011-10-12 NOTE — Progress Notes (Signed)
Occupational Therapy Session Note  Patient Details  Name: Joshua Glass MRN: [id number] Date of Birth: 08-04-1932  Today's Date: 10/12/2011 Time: 5621-3086 Time Calculation (min): 45 min   Skilled Therapeutic Interventions/Progress Updates: ADL-retraining with emphasis on use of assistive devices, as needed, dynamic standing balance, and home safety re-ed.   Patient completed standing grooming at sink side, donned clean shirt (after soiling his shirt during lunch), and donned socks/shoes in prep for continued dynamic standing balance re-training.   Patient demos good thoroughness during grooming but demo'd mild LOB while turning to look to his left while brushing his teeth.   BOS appears mildly deviated during static standing as patient appears to bear more weight through right leg versus left with some instability during trunk rotation.   Patient states that he has a low toilet at home and is NOT interested in installing grab bars despite episodes of instability.    Patient currently transfers at mod I but with some difficulty generating momentum from low seated position.   Therapy Documentation Precautions:  Precautions Precautions: Fall Required Braces or Orthoses: No Restrictions Weight Bearing Restrictions: No   Pain: Report no pain this session.   ADL: ADL Eating: Set up Grooming: Setup Upper Body Bathing: Minimal assistance Where Assessed-Upper Body Bathing: Shower Lower Body Bathing: Moderate assistance Where Assessed-Lower Body Bathing: Shower Upper Body Dressing: Minimal assistance Where Assessed-Upper Body Dressing: Edge of bed Lower Body Dressing: Moderate assistance Where Assessed-Lower Body Dressing: Edge of bed Toileting: Moderate assistance Toilet Transfer: Minimal assistance Toilet Transfer Method: Ambulating Psychologist, counselling Transfer: Minimal assistance Film/video editor Method: Designer, industrial/product: Shower seat with back  See FIM for  current functional status  Therapy: Individual Therapy  Georgeanne Nim 10/12/2011, 3:16 PM

## 2011-10-12 NOTE — Progress Notes (Signed)
Physical Therapy Note  Patient Details  Name: Joshua Glass MRN: [id number] Date of Birth: 1932/12/28 Today's Date: 10/12/2011  Time: 1000-1055 55 minutes  No c/o pain. Pt received pain meds prior to treatment.  Gait training in controlled environment with close supervision, increased cadence requiring verbal cues to slow down for safety.  Dynamic gait training with head turns, speed changes, start/stop with occasional min A for slight LOB, pt able to initiate self correction but needs assist due to delayed balance reactions.  Gait training with obstacle negotiation and gait during functional task (setting table) with close supervision.  Balance improves in less distracting environment.  [place] exercise program performed with 2# wts bilaterally for increased LE strength and improved balance.  Pt demos improved activity tolerance this morning as he required less frequent rests between activities.  Individual therapy   Jonalyn Sedlak 10/12/2011, 10:57 AM

## 2011-10-12 NOTE — Progress Notes (Signed)
Physical Therapy Note  Patient Details  Name: Joshua Glass MRN: [id number] Date of Birth: 1933-02-10 Today's Date: 10/12/2011  Time: 1515-1543 28 minutes  No c/o pain.  Dynamic gait training in min distracting and mod distracting environments with head turns and speed changes with close supervision.  1 LOB required min A to correct.  Nustep level 3 x 10 minutes for UE/LE strength and endurance.  Pt states he rides a recumbent bike at home.  Pt with overall improved activity tolerance today.  Individual therapy   Balin Vandegrift 10/12/2011, 4:26 PM

## 2011-10-13 LAB — GLUCOSE, CAPILLARY
Glucose-Capillary: 114 mg/dL — ABNORMAL HIGH (ref 70–99)
Glucose-Capillary: 160 mg/dL — ABNORMAL HIGH (ref 70–99)

## 2011-10-13 NOTE — Progress Notes (Signed)
Occupational Therapy Session Note  Patient Details  Name: Joshua Glass MRN: [id number] Date of Birth: [birth year]  Today's Date: 10/13/2011 Time: 1308-6578 Time Calculation (min): 45 min  Short Term Goals: Week 1:  OT Short Term Goal 1 (Week 1): Pt would peform tub shower transfer with supervision with appropriate DME OT Short Term Goal 2 (Week 1): Pt will don shirt with supervision OT Short Term Goal 3 (Week 1): Pt perform grooming standing mod I OT Short Term Goal 4 (Week 1): pt will don LB clothing with supervision OT Short Term Goal 5 (Week 1): Pt will demonstrate anticipatory awareness with ADL home making tasks with quesitoning cues  Skilled Therapeutic Interventions/Progress Updates: Therapeutic activity with emphasis on dynamic standing balance, safety during functional mobility, family ed on home exercise program (using Wii system), weight-shifting, and improved endurance.  Patient requires verbal cues to slow down while ambulating.  He demo's continued gait instability when turning suddenly due to impulsivity and/or inattention to task (distracted easily).  Patient continued Wii balance activity and enjoyed "bubble maze" obstacle course, which requires shifting weight forward, left, right and rearward.  Patient uses bedside table for added stability during game play and his family (brother) now plans to bring Wii system to his home for setup and use.  OT advised on need for balance board and walker in place for added safety and stability.     Therapy Documentation Precautions:  Precautions Precautions: Fall Required Braces or Orthoses: No Restrictions Weight Bearing Restrictions: No  Vital Signs: Therapy Vitals Temp: 98.6 F (37 C) Temp src: Oral Pulse Rate: 67  Resp: 20  BP: 165/77 mmHg Patient Position, if appropriate: Lying Oxygen Therapy SpO2: 92 % O2 Device: None (Room air)  Pain: Pain Assessment Pain Score: 0-No pain  ADL: ADL Eating: Set up Grooming:  Setup Upper Body Bathing: Minimal assistance Where Assessed-Upper Body Bathing: Shower Lower Body Bathing: Moderate assistance Where Assessed-Lower Body Bathing: Shower Upper Body Dressing: Minimal assistance Where Assessed-Upper Body Dressing: Edge of bed Lower Body Dressing: Moderate assistance Where Assessed-Lower Body Dressing: Edge of bed Toileting: Moderate assistance Toilet Transfer: Minimal assistance Toilet Transfer Method: Ambulating Film/video editor: Minimal assistance Film/video editor Method: Designer, industrial/product: Shower seat with back  See FIM for current functional status  Therapy: Individual Therapy  Georgeanne Nim 10/13/2011, 3:10 PM

## 2011-10-13 NOTE — Progress Notes (Signed)
Patient ID: COWAN PILAR, male   DOB: 1933/03/15, 78 y.o.   MRN: [id number] Patient ID: KENNEN STAMMER, male   DOB: [birth year], 78 y.o.   MRN: [id number] Patient ID: ALYAN HARTLINE, male   DOB: [birth year], 78 y.o.   MRN: [id number] Patient ID: JAWAN CHAVARRIA, male   DOB: 1933/01/29, 78 y.o.   MRN: [id number] Subjective/Complaints: Review of Systems  Respiratory: Negative for shortness of breath.   Cardiovascular: Negative for chest pain.  Musculoskeletal: Positive for back pain and joint pain.  Neurological: Positive for sensory change.  All other systems reviewed and are negative.  3/24.  Alert and in good spirits; Up OOB into chair;  No c/os; nice glycemic control Exam  oropharnyx clear; chest-clear; CV  Regular no tachy;  abd- obese soft; extr- no edema   Objective: Vital Signs: Blood pressure 153/80, pulse 63, temperature 98.7 F (37.1 C), temperature source Oral, resp. rate 22, SpO2 93.00%. No results found. No results found for this basename: WBC:2,HGB:2,HCT:2,PLT:2 in the last 72 hours No results found for this basename: NA:2,K:2,CL:2,CO2:2,GLUCOSE:2,BUN:2,CREATININE:2,CALCIUM:2 in the last 72 hours CBG (last 3)   Basename 10/13/11 0727 10/12/11 2021 10/12/11 1644  GLUCAP 120* 97 128*    Wt Readings from Last 3 Encounters:  10/07/11 107.502 kg (237 lb)  03/29/11 115.939 kg (255 lb 9.6 oz)  03/30/10 113.912 kg (251 lb 2.1 oz)    Physical Exam:  General appearance: alert, cooperative and no distress Head: Normocephalic, without obvious abnormality, atraumatic Eyes: conjunctivae/corneas clear. PERRL, EOM's intact. Fundi benign. Ears: normal TM's and external ear canals both ears Nose: Nares normal. Septum midline. Mucosa normal. No drainage or sinus tenderness. Throat: lips, mucosa, and tongue normal; teeth and gums normal Neck: no adenopathy, no carotid bruit, no JVD, supple, symmetrical, trachea midline and thyroid not enlarged, symmetric, no tenderness/mass/nodules Back:  symmetric, no curvature. ROM normal. No CVA tenderness. Resp: clear to auscultation bilaterally Cardio: regular rate and rhythm, S1, S2 normal, no murmur, click, rub or gallop GI: soft, non-tender; bowel sounds normal; no masses,  no organomegaly Extremities: extremities normal, atraumatic, no cyanosis or edema Pulses: 2+ and symmetric Skin: Skin color, texture, turgor normal. No rashes or lesions Neurologic: decreased peripheral sense to PP and LT. Shuffling gait. Speech quite clear. Vision slightly impaired. RUE motor scores affected by pain. He has generalized prox greater than distal weakness, right slightly weaker than left.  Cognitively, this patient has reasonable insight and awareness. Incision/Wound: a few bruises and abrasions noted. Musc:right rotator cuff with weakness in abd and ER/IR.  Tolerated PROM fairly well.   Assessment/Plan: 1. 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 Physiatrist is providing close team supervision and 24 hour management of active medical problems listed below. Physiatrist and rehab team continue to assess barriers to discharge/monitor patient progress toward functional and medical goals. FIM: FIM - Bathing Bathing Steps Patient Completed: Chest;Right Arm;Left Arm;Abdomen;Front perineal area;Buttocks;Right upper leg;Left upper leg;Left lower leg (including foot);Right lower leg (including foot) Bathing: 5: Supervision: Safety issues/verbal cues  FIM - Upper Body Dressing/Undressing Upper body dressing/undressing steps patient completed: Thread/unthread right sleeve of pullover shirt/dresss;Thread/unthread left sleeve of pullover shirt/dress Upper body dressing/undressing: 3: Mod-Patient completed 50-74% of tasks FIM - Lower Body Dressing/Undressing Lower body dressing/undressing steps patient completed: Don/Doff  right shoe;Don/Doff right sock;Don/Doff left sock;Don/Doff left shoe Lower body dressing/undressing: 4: Min-Patient completed 75 plus % of tasks  FIM - Toileting Toileting steps completed by patient: Adjust clothing prior to toileting;Performs perineal hygiene;Adjust clothing after toileting Toileting Assistive Devices: Grab bar or rail for support Toileting: 4: Steadying assist  FIM - Diplomatic Services operational officer Devices: Elevated toilet seat Toilet Transfers: 4-To toilet/BSC: Min A (steadying Pt. > 75%);4-From toilet/BSC: Min A (steadying Pt. > 75%)  FIM - Bed/Chair Transfer Bed/Chair Transfer Assistive Devices: Bed rails Bed/Chair Transfer: 5: Bed > Chair or W/C: Supervision (verbal cues/safety issues);5: Chair or W/C > Bed: Supervision (verbal cues/safety issues)  FIM - Locomotion: Wheelchair Locomotion: Wheelchair: 0: Activity did not occur FIM - Locomotion: Ambulation Locomotion: Ambulation Assistive Devices: Designer, industrial/product Ambulation/Gait Assistance: 4: Min assist Locomotion: Ambulation: 4: Travels 150 ft or more with minimal assistance (Pt.>75%)  Comprehension Comprehension Mode: Auditory Comprehension: 5-Understands complex 90% of the time/Cues < 10% of the time  Expression Expression Mode: Verbal Expression: 6-Expresses complex ideas: With extra time/assistive device  Social Interaction Social Interaction: 6-Interacts appropriately with others with medication or extra time (anti-anxiety, antidepressant).  Problem Solving Problem Solving: 5-Solves basic 90% of the time/requires cueing < 10% of the time  Memory Memory: 5-Recognizes or recalls 90% of the time/requires cueing < 10% of the time  1. DVT Prophylaxis/Anticoagulation: Mechanical: Sequential compression devices, below knee Bilateral lower extremities  2. Pain Management: hydrocodone trial scheduled prior to therapy sessions to help with pain management and participation of therapies.  3.  Mood: pleasant and motivated.  SW follow up for formal evaluation.  4. DM type 2: Will monitor with AC/HS CBG checks and titrate medication for better control. levemir increased but he may need further adjustment.  Sugars are showing improvement in general however.  Continue meal coverage as po intake good per wife.  5. HTN: Monitor with bid checks. Continue Cozaar. Off Maxzide currently. May need to resume if BP continues to be elevated.  6. Bilateral rotator cuff pathology: likely acute on chronic picture. MRI right shoulder with chronic RTC tear and bursitis.Pain improving. He likes flector, but it's unlikely that this will be covered in the outpt setting. Will switch to voltaren gel. 7. Severe multilevel cervical stenosis: Symptoms likely worsened due to fall. He has some neck pain which is mild in comparison to his shoulder.  Sx are most likely from whip lash injury in the setting of his spondylosis. He doesn't have any UMN signs on exam.  8. Diabetic neuropathy: no complaints of pain but contributing to premorbid gait disorder  LOS (Days) 4 A FACE TO FACE EVALUATION WAS PERFORMED  Rogelia Boga 10/13/2011, 9:06 AM

## 2011-10-14 DIAGNOSIS — S069X9A Unspecified intracranial injury with loss of consciousness of unspecified duration, initial encounter: Secondary | ICD-10-CM

## 2011-10-14 DIAGNOSIS — E1142 Type 2 diabetes mellitus with diabetic polyneuropathy: Secondary | ICD-10-CM

## 2011-10-14 DIAGNOSIS — W108XXA Fall (on) (from) other stairs and steps, initial encounter: Secondary | ICD-10-CM

## 2011-10-14 DIAGNOSIS — M7530 Calcific tendinitis of unspecified shoulder: Secondary | ICD-10-CM

## 2011-10-14 DIAGNOSIS — Z5189 Encounter for other specified aftercare: Secondary | ICD-10-CM

## 2011-10-14 LAB — GLUCOSE, CAPILLARY
Glucose-Capillary: 140 mg/dL — ABNORMAL HIGH (ref 70–99)
Glucose-Capillary: 132 mg/dL — ABNORMAL HIGH (ref 70–99)

## 2011-10-14 NOTE — Progress Notes (Signed)
Speech Language Pathology Daily Session Note  Patient Details  Name: Joshua Glass MRN: [id number] Date of Birth: 05-24-1933  Today's Date: 10/14/2011 Session 1  Time: 1130-1200 Time Calculation (min): 30 min  Session 2 Time: 1500-1530 Time Calculation: 30 mins  Short Term Goals:  SLP Short Term Goal 1 (Week 1): Pt will demonstrate complex problem solving with Mod I SLP Short Term Goal 2 (Week 1): Pt will recall new, daily information with Mod I.  SLP Short Term Goal 3 (Week 1): Pt will demonstrate divided attention between two tasks with Mod I for ~60 minutes.   Skilled Therapeutic Interventions:  Session 1: Treatment focus on working memory with utilization of compensatory strategies. Pt required supervision semantic cues to utilize the strategy of "association" to recall categories.  Session 2: Treatment focus on anticipatory awareness. Pt identified specific activities/tasks he will need assistance with at home with supervision questioning and semantic cues.    Daily Session FIM:  Comprehension Comprehension Mode: Auditory Comprehension: 5-Understands complex 90% of the time/Cues < 10% of the time Expression Expression Mode: Verbal Expression: 6-Expresses complex ideas: With extra time/assistive device Social Interaction Social Interaction: 6-Interacts appropriately with others with medication or extra time (anti-anxiety, antidepressant). Problem Solving Problem Solving: 5-Solves complex 90% of the time/cues < 10% of the time Memory Memory: 5-Recognizes or recalls 90% of the time/requires cueing < 10% of the time FIM - Eating Eating Activity: 6: Assistive device: dentures Pain Pain Assessment Pain Assessment: No/denies pain Pain Score: 0-No pain  Therapy/Group: Individual Therapy  Ruberta Holck 10/14/2011, 4:03 PM

## 2011-10-14 NOTE — Progress Notes (Signed)
Decreased ROM to right UE because of RTC tear. Patient feels sports cream and voltaren gel helping shoulders. Complains of decreased sensation with numbness to bilateral feet, chronic issue per patient. Reports using prescribed cream to feet at home. Daily bm's per patient with use of miralax. Stands to void with assistance to use urinal. Wears SCD's part of night. Pain managed with PRN vicodin. Wife stays at night, providing hands on assistance.Tawanna Solo

## 2011-10-14 NOTE — Progress Notes (Signed)
Physical Therapy Note  Patient Details  Name: Joshua Glass MRN: [id number] Date of Birth: June 04, 1933 Today's Date: 10/14/2011  9:55-10:45 individual therapy pt denied pain.  Session focused on gait training in home environment with rw due to Berg score of 31/56 with supervision then dynamic gait focusing on sidesteps backwards steps, and stepping over obstacles without rw. minguard assist.  floor transfer max assist with pt experiencing vestibular symptoms and nystagmus with transitions high kneel to rt. sidelying and from floor to high kneel. Plan to discuss vestibular evaluation at conference and may wait until outpatient to address. Joshua Kitchen  Joshua Glass 10/14/2011, 11:42 AM

## 2011-10-14 NOTE — Progress Notes (Signed)
Subjective/Complaints: Review of Systems  Respiratory: Negative for shortness of breath.   Cardiovascular: Negative for chest pain.  Musculoskeletal: Positive for back pain and joint pain.  Neurological: Positive for sensory change.  All other systems reviewed and are negative.  3/25- no complaints.  Pain under better control. Anxious to go home Objective: Vital Signs: Blood pressure 153/86, pulse 59, temperature 97.5 F (36.4 C), temperature source Oral, resp. rate 20, SpO2 97.00%. No results found. No results found for this basename: WBC:2,HGB:2,HCT:2,PLT:2 in the last 72 hours No results found for this basename: NA:2,K:2,CL:2,CO2:2,GLUCOSE:2,BUN:2,CREATININE:2,CALCIUM:2 in the last 72 hours CBG (last 3)   Basename 10/13/11 2040 10/13/11 1648 10/13/11 1206  GLUCAP 160* 114* 161*    Wt Readings from Last 3 Encounters:  10/07/11 107.502 kg (237 lb)  03/29/11 115.939 kg (255 lb 9.6 oz)  03/30/10 113.912 kg (251 lb 2.1 oz)    Physical Exam:  General appearance: alert, cooperative and no distress Head: Normocephalic, without obvious abnormality, atraumatic Eyes: conjunctivae/corneas clear. PERRL, EOM's intact. Fundi benign. Ears: normal TM's and external ear canals both ears Nose: Nares normal. Septum midline. Mucosa normal. No drainage or sinus tenderness. Throat: lips, mucosa, and tongue normal; teeth and gums normal Neck: no adenopathy, no carotid bruit, no JVD, supple, symmetrical, trachea midline and thyroid not enlarged, symmetric, no tenderness/mass/nodules Back: symmetric, no curvature. ROM normal. No CVA tenderness. Resp: clear to auscultation bilaterally Cardio: regular rate and rhythm, S1, S2 normal, no murmur, click, rub or gallop GI: soft, non-tender; bowel sounds normal; no masses,  no organomegaly Extremities: extremities normal, atraumatic, no cyanosis or edema Pulses: 2+ and symmetric Skin: Skin color, texture, turgor normal. No rashes or lesions Neurologic:  decreased peripheral sense to PP and LT. Shuffling gait. Speech quite clear. Vision slightly impaired. RUE motor scores affected by pain. He has generalized prox greater than distal weakness, right slightly weaker than left.  Cognitively, this patient has reasonable insight and awareness. Incision/Wound: a few bruises and abrasions noted. Musc:right rotator cuff with weakness in abd and ER/IR which is imoproving. Tolerated PROM fairly well. 3/25  Assessment/Plan: 1. 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 Physiatrist is providing close team supervision and 24 hour management of active medical problems listed below. Physiatrist and rehab team continue to assess barriers to discharge/monitor patient progress toward functional and medical goals.  May be ready to go home soon   FIM: FIM - Bathing Bathing Steps Patient Completed: Chest;Right Arm;Left Arm;Abdomen;Front perineal area;Buttocks;Right upper leg;Left upper leg;Left lower leg (including foot);Right lower leg (including foot) Bathing: 5: Supervision: Safety issues/verbal cues  FIM - Upper Body Dressing/Undressing Upper body dressing/undressing steps patient completed: Thread/unthread right sleeve of pullover shirt/dresss;Thread/unthread left sleeve of pullover shirt/dress Upper body dressing/undressing: 3: Mod-Patient completed 50-74% of tasks FIM - Lower Body Dressing/Undressing Lower body dressing/undressing steps patient completed: Don/Doff right shoe;Don/Doff right sock;Don/Doff left sock;Don/Doff left shoe Lower body dressing/undressing: 4: Min-Patient completed 75 plus % of tasks  FIM - Toileting Toileting steps completed by patient: Adjust clothing prior to toileting;Performs perineal hygiene;Adjust clothing after toileting Toileting Assistive Devices: Grab bar or rail for  support Toileting: 4: Steadying assist  FIM - Diplomatic Services operational officer Devices: Elevated toilet seat Toilet Transfers: 4-To toilet/BSC: Min A (steadying Pt. > 75%);4-From toilet/BSC: Min A (steadying Pt. > 75%)  FIM - Bed/Chair Transfer Bed/Chair Transfer Assistive Devices: Bed rails Bed/Chair Transfer: 5: Bed >  Chair or W/C: Supervision (verbal cues/safety issues);5: Chair or W/C > Bed: Supervision (verbal cues/safety issues)  FIM - Locomotion: Wheelchair Locomotion: Wheelchair: 0: Activity did not occur FIM - Locomotion: Ambulation Locomotion: Ambulation Assistive Devices: Designer, industrial/product Ambulation/Gait Assistance: 4: Min assist Locomotion: Ambulation: 4: Travels 150 ft or more with minimal assistance (Pt.>75%)  Comprehension Comprehension Mode: Auditory Comprehension: 5-Understands complex 90% of the time/Cues < 10% of the time  Expression Expression Mode: Verbal Expression: 6-Expresses complex ideas: With extra time/assistive device  Social Interaction Social Interaction: 6-Interacts appropriately with others with medication or extra time (anti-anxiety, antidepressant).  Problem Solving Problem Solving: 5-Solves complex 90% of the time/cues < 10% of the time  Memory Memory: 5-Recognizes or recalls 90% of the time/requires cueing < 10% of the time  1. DVT Prophylaxis/Anticoagulation: Mechanical: Sequential compression devices, below knee Bilateral lower extremities  2. Pain Management: hydrocodone trial scheduled prior to therapy sessions to help with pain management and participation of therapies.  3. Mood: pleasant and motivated.  SW follow up for formal evaluation.  4. DM type 2: Will monitor with AC/HS CBG checks and titrate medication for better control. levemir increased but he may need further adjustment.  Sugars under reasonable control. 5. HTN: Monitor with bid checks. Continue Cozaar. Off Maxzide currently. May need to resume if BP continues to  be elevated.  6. Bilateral rotator cuff pathology: likely acute on chronic picture. MRI right shoulder with chronic RTC tear and bursitis.Pain improving. Doing well with the voltaren gel.  7. Severe multilevel cervical stenosis: Symptoms likely worsened due to fall. He has some neck pain which is mild in comparison to his shoulder.  Sx are most likely from whip lash injury in the setting of his spondylosis. He doesn't have any UMN signs on exam.  8. Diabetic neuropathy: no complaints of pain but contributing to premorbid gait disorder  LOS (Days) 5 A FACE TO FACE EVALUATION WAS PERFORMED  Lynton Crescenzo T 10/14/2011, 7:24 AM

## 2011-10-14 NOTE — Progress Notes (Addendum)
Per State Regulation 482.30 This chart was reviewed for medical necessity with respect to the patient's Admission/Duration of stay. Pt participating & progressing in txs.  Team conf tomorrow.  Need to complete family ed w/ wife.   Brock Ra                 Nurse Care Manager              Next Review Date: 10/17/11

## 2011-10-14 NOTE — Progress Notes (Signed)
Occupational Therapy Session Note  Patient Details  Name: Joshua Glass MRN: [id number] Date of Birth: 1932-12-21  Today's Date: 10/14/2011 Time: 7829-5621 Time Calculation (min): 45 min  Short Term Goals: Week 1:  OT Short Term Goal 1 (Week 1): Pt would peform tub shower transfer with supervision with appropriate DME OT Short Term Goal 2 (Week 1): Pt will don shirt with supervision OT Short Term Goal 3 (Week 1): Pt perform grooming standing mod I OT Short Term Goal 4 (Week 1): pt will don LB clothing with supervision OT Short Term Goal 5 (Week 1): Pt will demonstrate anticipatory awareness with ADL home making tasks with quesitoning cues  Skilled Therapeutic Interventions/Progress Updates:    1:1 self care retraining at shower level down in ADL apartment. Focused treatment on safe functional ambulation with RW, remembering to use RW, getting in and out of tub by stepping over ledge with use of grab bars, standing balance in shower and with dressing, activity tolerance/ endurance, d/c planning. When returned to room, functional balance putting dirty clothes and towels away with RW.  Called pt's wife for an update on how pt did showering in bathtub. Recommended grab bars in bathroom or tub bench for transfer with sliding doors taken off.  Therapy Documentation Precautions:  Precautions Precautions: Fall Required Braces or Orthoses: No Restrictions Weight Bearing Restrictions: No Pain: No c/o pain  See FIM for current functional status  Therapy/Group: Individual Therapy  Melonie Florida 10/14/2011, 11:41 AM

## 2011-10-15 LAB — GLUCOSE, CAPILLARY
Glucose-Capillary: 114 mg/dL — ABNORMAL HIGH (ref 70–99)
Glucose-Capillary: 155 mg/dL — ABNORMAL HIGH (ref 70–99)

## 2011-10-15 NOTE — Progress Notes (Signed)
Patient ID: Joshua Glass, male   DOB: [birth year], 78 y.o.   MRN: [id number] Pt aware of plan for d/c tomorrow after am txs & completion of family ed w/ wife.  Wife has been home today overseeing placement of entry step rails.

## 2011-10-15 NOTE — Progress Notes (Signed)
Physical Therapy Note  Patient Details  Name: Joshua Glass MRN: [id number] Date of Birth: 1932/11/27 Today's Date: 10/15/2011  13:30-15:30 community outting pt denied pain.  Pt participated in an outting to the historical museum with therapy focusing on gait training with a cane over uneven ground, over curbs, up and down steps with 1 rail, head turns, and ramps all with supervision.    [name] 10/15/2011, 4:18 PM

## 2011-10-15 NOTE — Progress Notes (Signed)
Patient ID: Joshua Glass, male   DOB: 1933/05/05, 78 y.o.   MRN: [id number] Subjective/Complaints: Review of Systems  Respiratory: Negative for shortness of breath.   Cardiovascular: Negative for chest pain.  Musculoskeletal: Positive for back pain and joint pain.  Neurological: Positive for sensory change.  All other systems reviewed and are negative.  3/26- feeling well Objective: Vital Signs: Blood pressure 173/65, pulse 55, temperature 99.1 F (37.3 C), temperature source Oral, resp. rate 18, SpO2 98.00%. No results found. No results found for this basename: WBC:2,HGB:2,HCT:2,PLT:2 in the last 72 hours No results found for this basename: NA:2,K:2,CL:2,CO2:2,GLUCOSE:2,BUN:2,CREATININE:2,CALCIUM:2 in the last 72 hours CBG (last 3)   Basename 10/14/11 2055 10/14/11 1545 10/14/11 1219  GLUCAP 142* 132* 146*    Wt Readings from Last 3 Encounters:  10/07/11 107.502 kg (237 lb)  03/29/11 115.939 kg (255 lb 9.6 oz)  03/30/10 113.912 kg (251 lb 2.1 oz)    Physical Exam:  General appearance: alert, cooperative and no distress Head: Normocephalic, without obvious abnormality, atraumatic Eyes: conjunctivae/corneas clear. PERRL, EOM's intact. Fundi benign. Ears: normal TM's and external ear canals both ears Nose: Nares normal. Septum midline. Mucosa normal. No drainage or sinus tenderness. Throat: lips, mucosa, and tongue normal; teeth and gums normal Neck: no adenopathy, no carotid bruit, no JVD, supple, symmetrical, trachea midline and thyroid not enlarged, symmetric, no tenderness/mass/nodules Back: symmetric, no curvature. ROM normal. No CVA tenderness. Resp: clear to auscultation bilaterally Cardio: regular rate and rhythm, S1, S2 normal, no murmur, click, rub or gallop GI: soft, non-tender; bowel sounds normal; no masses,  no organomegaly Extremities: extremities normal, atraumatic, no cyanosis or edema Pulses: 2+ and symmetric Skin: Skin color, texture, turgor normal. No  rashes or lesions Neurologic: decreased peripheral sense to PP and LT. Shuffling gait. Speech quite clear. Vision slightly impaired. RUE motor scores affected by pain. He has generalized prox greater than distal weakness, right slightly weaker than left.  Cognitively, this patient has reasonable insight and awareness. Incision/Wound: a few bruises and abrasions noted. Musc:right rotator cuff with weakness in abd and ER/IR which is imoproving. Tolerated AROM fairly well. Putting his own shirt on this AM 3/26  Assessment/Plan: 1. 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 Physiatrist is providing close team supervision and 24 hour management of active medical problems listed below. Physiatrist and rehab team continue to assess barriers to discharge/monitor patient progress toward functional and medical goals.  We can do vestibular eva/tx as an outpt. Home tomorrow?   FIM: FIM - Bathing Bathing Steps Patient Completed: Chest;Right Arm;Right upper leg;Left lower leg (including foot);Left upper leg;Left Arm;Abdomen;Front perineal area;Right lower leg (including foot);Buttocks Bathing: 5: Supervision: Safety issues/verbal cues  FIM - Upper Body Dressing/Undressing Upper body dressing/undressing steps patient completed: Thread/unthread left sleeve of pullover shirt/dress;Put head through opening of pull over shirt/dress;Thread/unthread right sleeve of front closure shirt/dress;Pull shirt over trunk Upper body dressing/undressing: 5: Set-up assist to: Obtain clothing/put away FIM - Lower Body Dressing/Undressing Lower body dressing/undressing steps patient completed: Thread/unthread right underwear leg;Pull underwear up/down;Thread/unthread right pants leg;Thread/unthread left underwear leg;Pull pants up/down;Thread/unthread left pants leg;Don/Doff right  shoe;Don/Doff left shoe Lower body dressing/undressing: 5: Supervision: Safety issues/verbal cues  FIM - Toileting Toileting steps completed by patient: Adjust clothing prior to toileting;Performs perineal hygiene;Adjust clothing after toileting Toileting Assistive Devices: Grab bar or rail for support Toileting: 4: Steadying assist  FIM - Archivist  Transfers Assistive Devices: Elevated toilet seat Toilet Transfers: 5-To toilet/BSC: Supervision (verbal cues/safety issues)  FIM - Press photographer Assistive Devices: Bed rails Bed/Chair Transfer: 5: Supine > Sit: Supervision (verbal cues/safety issues);4: Sit > Supine: Min A (steadying pt. > 75%/lift 1 leg);4: Bed > Chair or W/C: Min A (steadying Pt. > 75%);4: Chair or W/C > Bed: Min A (steadying Pt. > 75%)  FIM - Locomotion: Wheelchair Locomotion: Wheelchair: 0: Activity did not occur FIM - Locomotion: Ambulation Locomotion: Ambulation Assistive Devices: Designer, industrial/product Ambulation/Gait Assistance: 4: Min assist Locomotion: Ambulation: 4: Travels 150 ft or more with minimal assistance (Pt.>75%)  Comprehension Comprehension Mode: Auditory Comprehension: 6-Follows complex conversation/direction: With extra time/assistive device  Expression Expression Mode: Verbal Expression: 6-Expresses complex ideas: With extra time/assistive device  Social Interaction Social Interaction: 6-Interacts appropriately with others with medication or extra time (anti-anxiety, antidepressant).  Problem Solving Problem Solving: 5-Solves complex 90% of the time/cues < 10% of the time  Memory Memory: 5-Recognizes or recalls 90% of the time/requires cueing < 10% of the time  1. DVT Prophylaxis/Anticoagulation: Mechanical: Sequential compression devices, below knee Bilateral lower extremities  2. Pain Management: hydrocodone trial scheduled prior to therapy sessions to help with pain management and participation of  therapies. Much improved 3. Mood: pleasant and motivated.  SW follow up for formal evaluation.  4. DM type 2: Will monitor with AC/HS CBG checks and titrate medication for better control. levemir increased but he may need further adjustment.  Sugars under reasonable control. 5. HTN: Monitor with bid checks. Continue Cozaar. Off Maxzide currently. May need to resume if BP continues to be elevated.  6. Bilateral rotator cuff pathology: likely acute on chronic picture. MRI right shoulder with chronic RTC tear and bursitis.Pain improving. Doing well with the voltaren gel. consv mgt indicated 7. Severe multilevel cervical stenosis: Symptoms likely worsened due to fall. He has some neck pain which is mild in comparison to his shoulder.  Sx are most likely from whip lash injury in the setting of his spondylosis. He doesn't have any UMN signs on exam.  8. Diabetic neuropathy: no complaints of pain but contributing to premorbid gait disorder  LOS (Days) 6 A FACE TO FACE EVALUATION WAS PERFORMED  Taariq Leitz T 10/15/2011, 8:24 AM

## 2011-10-15 NOTE — Progress Notes (Signed)
Speech Language Pathology Daily Session Note  Patient Details  Name: MEHAR KIRKWOOD MRN: [id number] Date of Birth: 1932-09-30  Today's Date: 10/15/2011 Time: 0735-0800 Time Calculation (min): 25 min  Short Term Goals: Week 1:  SLP Short Term Goal 1 (Week 1): Pt will demonstrate complex problem solving with Mod I SLP Short Term Goal 2 (Week 1): Pt will recall new, daily information with Mod I.  SLP Short Term Goal 3 (Week 1): Pt will demonstrate divided attention between two tasks with Mod I for ~60 minutes.   Skilled Therapeutic Interventions:  Treatment focused on incorporation of cognitive compensatory strategies in regards to patient's progress in therapies, requiring supervision level verbal cues to utilize yesterday's schedule as a cue for recall of events.  Patient's wife reported cognitive changes for 3 years since being on insulin with poor frustration tolerance and problem solving.  Patient denies these changes and stated "I wouldn't admit to them anyway because that is a sign of weakness."  Educated patient and his wife on possible plan for return to home with medication and money management in order to provide patient assistance and some independence.  FIM:  Comprehension Comprehension Mode: Auditory Comprehension: 6-Follows complex conversation/direction: With extra time/assistive device Expression Expression Mode: Verbal Expression: 6-Expresses complex ideas: With extra time/assistive device Social Interaction Social Interaction: 6-Interacts appropriately with others with medication or extra time (anti-anxiety, antidepressant). Problem Solving Problem Solving: 5-Solves complex 90% of the time/cues < 10% of the time Memory Memory: 5-Recognizes or recalls 90% of the time/requires cueing < 10% of the time FIM - Eating Eating Activity: 7: Complete independence:no helper General  Amount of Missed SLP Time (min): 5 Minutes Missed Time Reason: Unavailable (Comment) Pain Pain  Assessment Pain Assessment: No/denies pain Pain Score: 0-No pain Patients Stated Pain Goal: 2  Therapy/Group: Individual Therapy  Myra Rude, M.S.,CCC-SLP Pager [phone] 10/15/2011, 12:12 PM

## 2011-10-15 NOTE — Progress Notes (Signed)
Occupational Therapy Session Note  Patient Details  Name: Joshua Glass MRN: [id number] Date of Birth: 04/17/1933  Today's Date: 10/15/2011 Time: 4098-1191 Time Calculation (min): 54 min  Short Term Goals: Week 1:  OT Short Term Goal 1 (Week 1): Pt would peform tub shower transfer with supervision with appropriate DME OT Short Term Goal 2 (Week 1): Pt will don shirt with supervision OT Short Term Goal 3 (Week 1): Pt perform grooming standing mod I OT Short Term Goal 4 (Week 1): pt will don LB clothing with supervision OT Short Term Goal 5 (Week 1): Pt will demonstrate anticipatory awareness with ADL home making tasks with quesitoning cues  Skilled Therapeutic Interventions/Progress Updates:    ADL retraining including bathing at walk-in shower level and dressing with sit to stand from EOB.  Pt donned underwear while standing and using UE for support.  No LOB noted.  Pt ambulated to ADL apartment for home mgmt tasks.  LOB X 3 with head turns to R or L.  Pt stated he had been experiencing some dizzyness prior to this hospitalization.  Focus on safety awareness and activity tolerance.  Therapy Documentation Precautions:  Precautions Precautions: Fall Required Braces or Orthoses: No Restrictions Weight Bearing Restrictions: No General:   Pain: Pain Assessment Pain Assessment: No/denies pain    See FIM for current functional status  Therapy/Group: Individual Therapy  Rich Brave 10/15/2011, 8:55 AM

## 2011-10-15 NOTE — Progress Notes (Signed)
Physical Therapy Note  Patient Details  Name: Joshua Glass MRN: [id number] Date of Birth: [birth year] Today's Date: 10/15/2011  14:30-15:00 individual therapy pt denied pain,  gaijt training 2x170' with rw supervision. Pt prefers to try cane and will try on outting tomorrow. Performed floor transfer focusing on transition from prone to quadraped to high kneel then half kneel with min assist. Performed x 3 each way.  [name] 10/15/2011, 9:11 AM

## 2011-10-15 NOTE — Patient Care Conference (Signed)
Inpatient RehabilitationTeam Conference Note Date: 10/15/2011   Time: 2:55 PM    Patient Name: Joshua Glass      Medical Record Number: [id number]  Date of Birth: 09-Nov-1932 Sex: Male         Room/Bed: 4004/4004-01 Payor Info: Payor: Advertising copywriter MEDICARE  Plan: [employer] MEDICARE COMPLETE  Product Type: *No Product type*     Admitting Diagnosis: SDH  Admit Date/Time:  10/09/2011  3:50 PM Admission Comments: No comment available   Primary Diagnosis:  SDH (subdural hematoma) Principal Problem: SDH (subdural hematoma)  Patient Active Problem List  Diagnoses Date Noted  . Complete rotator cuff tear or rupture of right shoulder 10/10/2011  . Gallstone   . SDH (subdural hematoma) 10/05/2011  . Fall 10/05/2011  . DIABETES MELLITUS 03/29/2008  . AUTONOMIC NEUROPATHY, DIABETIC 03/29/2008  . OBSTRUCTIVE SLEEP APNEA 03/29/2008  . HYPERTENSION 03/29/2008  . ALLERGIC RHINITIS 03/29/2008    Expected Discharge Date: Expected Discharge Date: 10/16/11  Team Members Present: Physician: Dr. Faith Rogue Case Manager Present: Melanee Spry, RN Social Worker Present: Amada Jupiter, LCSW Nurse Present: Carmie End, RN PT Present: Elvia Collum, PT OT Present: Mackie Pai, Marye Round, OT;Ardis Rowan, COTA Other (Discipline and Name): Tora Duck, PPS Coordinator     Current Status/Progress Goal Weekly Team Focus  Medical   Subdural hemorrhage after a fall down the stairs. He is acute on chronic right rotator cuff injury  Patient and family education and neuromuscular reeducation as well as adaptive equipment training  Improve balance and safety. There are long-term issues at play here   Bowel/Bladder             Swallow/Nutrition/ Hydration             ADL's   supervision overall  mod I overall, supervision for tub transfer  activity tolerance   Mobility   min assist to supervision overall, min assist floor transfer  mod I overall  balance, floor transfers with wife, outting,   (vestibular eval. at outpatient.)   Communication             Safety/Cognition/ Behavioral Observations            Pain             Skin                *See Interdisciplinary Assessment and Plan and progress notes for long and short-term goals  Barriers to Discharge: Chronicity of his peripheral neuropathy    Possible Resolutions to Barriers:  Safety education adaptive equipment training. Vestibular evaluation as an outpatient    Discharge Planning/Teaching Needs:  home with wife who can provide 24/7 assistance      Team Discussion: Pt ready for d/c tomorrow after am txs & completion of family ed.   Revisions to Treatment Plan: none    Continued Need for Acute Rehabilitation Level of Care: The patient requires daily medical management by a physician with specialized training in physical medicine and rehabilitation for the following conditions: Daily direction of a multidisciplinary physical rehabilitation program to ensure safe treatment while eliciting the highest outcome that is of practical value to the patient.: Yes Daily medical management of patient stability for increased activity during participation in an intensive rehabilitation regime.: Yes Daily analysis of laboratory values and/or radiology reports with any subsequent need for medication adjustment of medical intervention for : Neurological problems;Post surgical problems;Other  Brock Ra 10/15/2011, 5:22 PM

## 2011-10-15 NOTE — Progress Notes (Signed)
Recreational Therapy Session Note  Patient Details  Name: Joshua Glass MRN: [id number] Date of Birth: 1932-12-07 Today's Date: 10/15/2011 Time: 1330-1530 Pain: no c/o Skilled Therapeutic Interventions/Progress Updates: Advertising copywriter to [employer] working on ambulation on even/uneven surface, identification & negotiation of obstacles, energy conservation techniques with supervision using SPC  Therapy/Group: Community Reintegration  Activity Level: Moderate:  Level of assist: Supervision  Bren Borys 10/15/2011, 4:38 PM

## 2011-10-16 DIAGNOSIS — S069X9A Unspecified intracranial injury with loss of consciousness of unspecified duration, initial encounter: Secondary | ICD-10-CM

## 2011-10-16 DIAGNOSIS — M7530 Calcific tendinitis of unspecified shoulder: Secondary | ICD-10-CM

## 2011-10-16 DIAGNOSIS — W108XXA Fall (on) (from) other stairs and steps, initial encounter: Secondary | ICD-10-CM

## 2011-10-16 DIAGNOSIS — E1142 Type 2 diabetes mellitus with diabetic polyneuropathy: Secondary | ICD-10-CM

## 2011-10-16 DIAGNOSIS — Z5189 Encounter for other specified aftercare: Secondary | ICD-10-CM

## 2011-10-16 MED ORDER — DSS 100 MG PO CAPS: 100.0000 mg | ORAL_CAPSULE | Freq: Two times a day (BID) | ORAL | Status: AC

## 2011-10-16 MED ORDER — MUSCLE RUB 10-15 % EX CREA: 1.0000 "application " | TOPICAL_CREAM | Freq: Three times a day (TID) | CUTANEOUS | Status: DC

## 2011-10-16 MED ORDER — DICLOFENAC SODIUM 1 % TD GEL: 1.0000 "application " | Freq: Three times a day (TID) | TRANSDERMAL | Status: DC

## 2011-10-16 MED ORDER — INSULIN ASPART 100 UNIT/ML ~~LOC~~ SOLN: 4.0000 [IU] | Freq: Three times a day (TID) | SUBCUTANEOUS | Status: DC

## 2011-10-16 MED ORDER — HYDROCODONE-ACETAMINOPHEN 5-325 MG PO TABS: 1.0000 | ORAL_TABLET | Freq: Four times a day (QID) | ORAL | Status: AC | PRN

## 2011-10-16 MED ORDER — POLYETHYLENE GLYCOL 3350 17 G PO PACK: 17.0000 g | PACK | Freq: Every day | ORAL | Status: AC

## 2011-10-16 MED ORDER — INSULIN DETEMIR 100 UNIT/ML ~~LOC~~ SOLN: SUBCUTANEOUS | Status: DC

## 2011-10-16 NOTE — Progress Notes (Signed)
Speech Language Pathology Discharge Summary & Session Note  Patient Details  Name: CATLIN AYCOCK MRN: [id number] Date of Birth: April 30, 1933  Today's Date: 10/16/2011 Time: 0800-0830 Time Calculation (min): 30 min  Therapeutic Intervention: Treatment focus on family education in regards to current cognitive function and strategies to utilize at home. Pt's wife given handout in regards to memory strategies to utilize at home. Both the pt and pt's wife verbalized understanding. Pt will discharge home with wife with 24/7 supervision.  Patient has met 3 of 3 long term goals this reporting period.  Patient to discharge at overall Modified Independent-Supervision level and has made functional gains with utilization of memory compensatory strategies, increased attention and functional problem solving.  Patient's care partner is independent to provide the necessary cognitive assistance at discharge and will also provide 24/7 supervision.   Reasons goals not met: N/A  Recommendation: Patient requires no SLP follow up post CIR discharge.  Equipment: N/A  Reasons for discharge: treatment goals met and discharge from hospital  Patient/family agrees with progress made and goals achieved: Yes  See FIM for current functional status  Fiorella Hanahan 10/16/2011, 8:40 AM

## 2011-10-16 NOTE — Progress Notes (Signed)
Physical Therapy Discharge Summary  Patient Details  Name: Joshua Glass MRN: [id number] Date of Birth: 03/12/1933  Today's Date: 10/16/2011 Time:  - 8:30-9:40     Patient has met 8 of 8 long term goals due to improved activity tolerance, improved balance, decreased pain and improved awareness.  Patient to discharge at an ambulatory level Modified Independent.   Patient's care partner is independent to provide the necessary physical assistance at discharge. Pt requires supervision on the steps for technique and safety. Demonstrated technique for floor transfer. Pt did require min assist for floor transfer. Recommendation was made for pt to use cane in community and to have min assist for steps with out railings.  Pt demonstrated vestibular symptoms with some transition movements. Vestibular evaluation was set up at outpatient.  Reasons goals not met: N/A  Recommendation:  Patient will benefit from ongoing skilled PT services in outpatient setting to continue to advance safe functional mobility, address ongoing impairments in balance, mobility, gait, and minimize fall risk.  Equipment: No equipment provided  Pt owned a straight cane.  Reasons for discharge: discharge from hospital  Patient/family agrees with progress made and goals achieved: Yes  PT Discharge Precautions/Restrictions Precautions Precautions: Fall Required Braces or Orthoses: No Vital Signs   Pain Pain Assessment Pain Assessment: No/denies pain        Sensation Sensation Light Touch: Impaired Detail (pt does not have sensation in either foot) Light Touch Impaired Details: Absent LLE;Absent RLE Hot/Cold: Impaired Detail Hot/Cold Impaired Details: Absent LLE;Absent RLE Proprioception: Impaired Detail Proprioception Impaired Details: Absent LLE;Absent RLE Motor  Motor Motor - Discharge Observations: generalized weakness  Mobility Bed Mobility Bed Mobility: Yes Supine to Sit: 6: Modified independent  (Device/Increase time) Sitting - Scoot to Edge of Bed: 6: Modified independent (Device/Increase time) Sit to Supine: 6: Modified independent (Device/Increase time) Transfers Transfers: Yes Sit to Stand: 6: Modified independent (Device/Increase time) Stand to Sit: 6: Modified independent (Device/Increase time) Stand Pivot Transfers: 7: Independent Locomotion  Ambulation Ambulation: Yes Ambulation/Gait Assistance: 6: Modified independent (Device/Increase time) Ambulation Distance (Feet): 150 Feet Assistive device: Straight cane Gait Gait: Yes Gait Pattern: Within Functional Limits Gait velocity: decreased speed Stairs / Additional Locomotion Stairs: Yes Stairs Assistance: 5: Supervision Stairs Assistance Details (indicate cue type and reason): vc for step to pattern, use of handrail and cane placement. Stair Management Technique: One rail Right;With cane Number of Stairs: 12  Wheelchair Mobility Wheelchair Mobility: No      Balance Balance Balance Assessed: No Extremity Assessment      RLE Assessment RLE Assessment: Within Functional Limits LLE Assessment LLE Assessment: Within Functional Limits  See FIM for current functional status  Joshua Glass 10/16/2011, 1:35 PM

## 2011-10-16 NOTE — Progress Notes (Signed)
Subjective/Complaints: Review of Systems  Respiratory: Negative for shortness of breath.   Cardiovascular: Negative for chest pain.  Musculoskeletal: Positive for back pain and joint pain.  Neurological: Positive for sensory change.  All other systems reviewed and are negative.  3/27- ready to go home! Objective: Vital Signs: Blood pressure 151/81, pulse 63, temperature 98.4 F (36.9 C), temperature source Oral, resp. rate 17, SpO2 97.00%. No results found. No results found for this basename: WBC:2,HGB:2,HCT:2,PLT:2 in the last 72 hours No results found for this basename: NA:2,K:2,CL:2,CO2:2,GLUCOSE:2,BUN:2,CREATININE:2,CALCIUM:2 in the last 72 hours CBG (last 3)   Basename 10/15/11 2102 10/15/11 1604 10/15/11 1202  GLUCAP 147* 144* 155*    Wt Readings from Last 3 Encounters:  10/07/11 107.502 kg (237 lb)  03/29/11 115.939 kg (255 lb 9.6 oz)  03/30/10 113.912 kg (251 lb 2.1 oz)    Physical Exam:  General appearance: alert, cooperative and no distress Head: Normocephalic, without obvious abnormality, atraumatic Eyes: conjunctivae/corneas clear. PERRL, EOM's intact. Fundi benign. Ears: normal TM's and external ear canals both ears Nose: Nares normal. Septum midline. Mucosa normal. No drainage or sinus tenderness. Throat: lips, mucosa, and tongue normal; teeth and gums normal Neck: no adenopathy, no carotid bruit, no JVD, supple, symmetrical, trachea midline and thyroid not enlarged, symmetric, no tenderness/mass/nodules Back: symmetric, no curvature. ROM normal. No CVA tenderness. Resp: clear to auscultation bilaterally Cardio: regular rate and rhythm, S1, S2 normal, no murmur, click, rub or gallop GI: soft, non-tender; bowel sounds normal; no masses,  no organomegaly Extremities: extremities normal, atraumatic, no cyanosis or edema Pulses: 2+ and symmetric Skin: Skin color, texture, turgor normal. No rashes or lesions Neurologic: decreased peripheral sense to PP and LT.  Shuffling gait. Speech quite clear. Vision slightly impaired. RUE motor scores affected by pain. He has generalized prox greater than distal weakness, right slightly weaker than left.  Cognitively, this patient has reasonable insight and awareness. Incision/Wound: a few bruises and abrasions noted. Musc:right rotator cuff with weakness in abd and ER/IR which is imoproving. Tolerated AROM fairly well. Putting his own shirt on this AM 3/27 exam  Assessment/Plan: 1. 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 Physiatrist is providing close team supervision and 24 hour management of active medical problems listed below. Physiatrist and rehab team continue to assess barriers to discharge/monitor patient progress toward functional and medical goals.  We can do vestibular eva/tx as an outpt.   FIM: FIM - Bathing Bathing Steps Patient Completed: Chest;Right Arm;Left Arm;Abdomen;Front perineal area;Buttocks;Right upper leg;Left upper leg;Right lower leg (including foot);Left lower leg (including foot) Bathing: 6: More than reasonable amount of time  FIM - Upper Body Dressing/Undressing Upper body dressing/undressing steps patient completed: Thread/unthread right sleeve of pullover shirt/dresss;Thread/unthread left sleeve of pullover shirt/dress;Put head through opening of pull over shirt/dress;Pull shirt over trunk Upper body dressing/undressing: 6: More than reasonable amount of time FIM - Lower Body Dressing/Undressing Lower body dressing/undressing steps patient completed: Thread/unthread right underwear leg;Thread/unthread left underwear leg;Pull underwear up/down;Thread/unthread right pants leg;Thread/unthread left pants leg;Pull pants up/down;Fasten/unfasten pants;Don/Doff right sock;Don/Doff left shoe;Don/Doff right shoe;Don/Doff left sock Lower body  dressing/undressing: 6: More than reasonable amount of time  FIM - Toileting Toileting steps completed by patient: Adjust clothing prior to toileting;Performs perineal hygiene;Adjust clothing after toileting Toileting Assistive Devices: Grab bar or rail for support Toileting: 6: Assistive device: No helper  FIM - Diplomatic Services operational officer Devices: Elevated toilet  seat Toilet Transfers: 6-Assistive device: No helper;6-To toilet/ BSC;6-From toilet/BSC  FIM - Banker Devices: Teacher, music: 6: Assistive device: no helper  FIM - Locomotion: Wheelchair Locomotion: Wheelchair: 0: Activity did not occur FIM - Locomotion: Ambulation Locomotion: Ambulation Assistive Devices: Emergency planning/management officer Ambulation/Gait Assistance: 6: Modified independent (Device/Increase time) Locomotion: Ambulation: 6: Travels 150 ft or more with assistive device/no helper  Comprehension Comprehension Mode: Auditory Comprehension: 6-Follows complex conversation/direction: With extra time/assistive device  Expression Expression Mode: Verbal Expression: 6-Expresses complex ideas: With extra time/assistive device  Social Interaction Social Interaction: 7-Interacts appropriately with others - No medications needed.  Problem Solving Problem Solving: 5-Solves complex 90% of the time/cues < 10% of the time  Memory Memory: 5-Recognizes or recalls 90% of the time/requires cueing < 10% of the time  1. DVT Prophylaxis/Anticoagulation: Mechanical: Sequential compression devices, below knee Bilateral lower extremities  2. Pain Management: hydrocodone trial scheduled prior to therapy sessions to help with pain management and participation of therapies. Much improved overall. 3. Mood: pleasant and motivated.  SW follow up for formal evaluation.  4. DM type 2: Will monitor with AC/HS CBG checks and titrate medication for better control. levemir increased but he  may need further adjustment.  Sugars under reasonable control. Discussed home mgt with patient 5. HTN: Monitor with bid checks. Continue Cozaar. Off Maxzide currently. May need to resume if BP continues to be elevated.  6. Bilateral rotator cuff pathology: likely acute on chronic picture. MRI right shoulder with chronic RTC tear and bursitis.Pain improving. Doing well with the voltaren gel. consv mgt indicated 7. Severe multilevel cervical stenosis: Symptoms likely worsened due to fall. He has some neck pain which is mild in comparison to his shoulder.  Sx are most likely from whip lash injury in the setting of his spondylosis. He doesn'Glass have any UMN signs on exam.  8. Diabetic neuropathy: no complaints of pain but contributing to premorbid gait disorder. Has pain when he walks longer distances  LOS (Days) 7 A FACE TO FACE EVALUATION WAS PERFORMED  Joshua Glass 10/16/2011, 7:43 AM

## 2011-10-16 NOTE — Discharge Instructions (Signed)
Inpatient Rehab Discharge Instructions  Joshua Glass Discharge date and time: 10/16/11   Activities/Precautions/ Functional Status: Activity: ambulate in house independently Diet: diabetic diet Wound Care: none needed Functional status:  ___ No restrictions     _X__ Walk up steps independently ___ 24/7 supervision/assistance   ___ Walk up steps with assistance _X__ Intermittent supervision/assistance  ___ Bathe/dress independently _X__ Walk with cane                 ___ Bathe/dress with assistance ___ Walk Independently    ___ Shower independently ___ Walk with assistance    ___ Shower with assistance _X__ No alcohol     ___ Return to work/school ________   COMMUNITY REFERRALS UPON DISCHARGE:    Outpatient: PT                                          Agency: [facility] Phone: [phone]                                       Appointment Date/Time:  4/3 @ 1:45 (arrive at 1:15 pm)  GENERAL COMMUNITY RESOURCES FOR PATIENT/FAMILY:  Support Groups: Brain Injury Support Group   Caregiver Support: Same   Special Instructions: Check blood sugars before meals and at bedtime.   My questions have been answered and I understand these instructions. I will adhere to these goals and the provided educational materials after my discharge from the hospital.  Patient/Caregiver Signature _______________________________ Date __________  Clinician Signature _______________________________________ Date __________  Please bring this form and your medication list with you to all your follow-up doctor's appointments.

## 2011-10-16 NOTE — Progress Notes (Signed)
Social Work  Discharge Note  The overall goal for the admission was met for:   Discharge location: Yes - home with wife  Length of Stay: Yes - 7 days  Discharge activity level: Yes  - supervision level overall  Home/community participation: Yes  Services provided included: MD, RD, PT, OT, SLP, RN, CM, TR, Pharmacy and SW  Financial Services: Other: [employer] Medicare  Follow-up services arranged: Outpatient: PT via [facility], DME: NA - no needs and Patient/Family has no preference for HH/DME agencies  Comments (or additional information): Provided info on local Brain Injury Support Group and [facility]  Patient/Family verbalized understanding of follow-up arrangements: Yes  Individual responsible for coordination of the follow-up plan: pt and wife  Confirmed correct DME delivered: NA   Que Meneely

## 2011-10-16 NOTE — Progress Notes (Signed)
Occupational Therapy Discharge Summary and Session Note  Patient Details  Name: Joshua Glass MRN: [id number] Date of Birth: [birth year]  Today's Date: 10/16/2011  Session Note Time: 0700-0738 Time Calculation (min): 38 min Pt denies pain  ADL retraining and family education with wife.  Pt demonstrated stepping over into tub using grab bars for support.  Pt performed simple home mgmt tasks using SPC when ambulating.  Pt continues to demonstrate increased dizzyness with head movements.  Discussed with pt and wife home safety (removing throw rugs and having a clear pathway throughout home).    Discharge Summary Patient has met 11 of 11 long term goals due to improved activity tolerance, improved balance, postural control, functional use of  RIGHT upper and LEFT upper extremity, improved attention, improved awareness and improved coordination.  Pt is mod I for bathing, dressing, toilet transfers and toileting; supervision for stepping over into tub using grab bars. Pt continues to require increased time to complete tasks and requires rest breaks appropriately during tasks.  Patient to discharge at overall Modified Independent level, using a RW for functional ambulation to decrease his fall risk (his BERG indicated a high fall risk and an AD was recommended .  Patient's care partner is independent to provide the necessary physical and cognitive assistance at discharge.     Recommendation:  Patient does not need ongoing skilled OT services, but would benefit from a vestibular eval in outpatient setting to continue to advance functional skills and decrease his fall risk.  Equipment: No equipment provided  Reasons for discharge: treatment goals met and discharge from hospital  Patient/family agrees with progress made and goals achieved: Yes  OT Discharge Precautions/Restrictions  Precautions Precautions: Fall Required Braces or Orthoses: No Restrictions Weight Bearing Restrictions:  No Pain Pain Assessment Pain Assessment: No/denies pain  ADL ADL Eating: Set up Grooming: Independent Where Assessed-Grooming: Standing at sink Upper Body Bathing: Modified independent Where Assessed-Upper Body Bathing: Shower Lower Body Bathing: Modified independent Where Assessed-Lower Body Bathing: Shower Upper Body Dressing: Modified independent (Device) Where Assessed-Upper Body Dressing: Chair Lower Body Dressing: Modified independent Where Assessed-Lower Body Dressing: Chair;Standing at sink Toileting: Modified independent Where Assessed-Toileting: Neurosurgeon Method: Proofreader: Raised toilet seat Tub/Shower Transfer: Modified independent Web designer Method: Engineer, technical sales: Information systems manager with back Film/video editor: Insurance underwriter Method: Designer, industrial/product: Information systems manager with back Vision/Perception  Vision - History Baseline Vision: No visual deficits Patient Visual Report: No change from baseline Perception Perception: Within Functional Limits Praxis Praxis: Intact    Sensation Sensation Light Touch: Appears Intact Stereognosis: Appears Intact Hot/Cold: Appears Intact Proprioception: Appears Intact Coordination Gross Motor Movements are Fluid and Coordinated: Yes Fine Motor Movements are Fluid and Coordinated: Yes Motor  Motor Motor: Abnormal postural alignment and control  Trunk/Postural Assessment  Cervical Assessment Cervical Assessment: Within Functional Limits Thoracic Assessment Thoracic Assessment: Exceptions to [facility] (dcreased rotation) Lumbar Assessment Lumbar Assessment:  (dcreased rotation)  Extremity/Trunk Assessment RUE Assessment RUE Assessment: Exceptions to [facility] RUE AROM (degrees) Right Shoulder Flexion  0-170: 90 Degrees RUE Strength RUE Overall Strength: Within Functional Limits for  tasks performed LUE Assessment LUE Assessment: Exceptions to [facility] LUE AROM (degrees) Left Shoulder Flexion  0-170: 100 Degrees LUE Strength LUE Overall Strength: Within Functional Limits for tasks assessed  See FIM for current functional status  Rich Brave 10/16/2011, 7:39 AM

## 2011-10-16 NOTE — Progress Notes (Signed)
Recreational Therapy Discharge Summary Patient Details  Name: RENLY ROOTS MRN: [id number] Date of Birth: September 03, 1932 Today's Date: 10/16/2011  Long term goals set: 2  Long term goals met: 2  Comments on progress toward goals: Pt has made great progress toward goals meeting both.  Pt requires extra time and use of AE to complete tasks safely.  Pt is being discharged home today with wife at overall Mod I level.  Reasons goals not met: n/a  Equipment acquired: n/a  Reasons for discharge: discharge from hospital  Patient/family agrees with progress made and goals achieved: Yes  Asahd Can 10/16/2011, 10:12 AM

## 2011-10-23 ENCOUNTER — Ambulatory Visit
Payer: Medicare Other | Attending: Physical Medicine & Rehabilitation | Admitting: Rehabilitative and Restorative Service Providers"

## 2011-10-23 DIAGNOSIS — H8110 Benign paroxysmal vertigo, unspecified ear: Secondary | ICD-10-CM | POA: Insufficient documentation

## 2011-10-23 DIAGNOSIS — R269 Unspecified abnormalities of gait and mobility: Secondary | ICD-10-CM | POA: Insufficient documentation

## 2011-10-23 DIAGNOSIS — IMO0001 Reserved for inherently not codable concepts without codable children: Secondary | ICD-10-CM | POA: Insufficient documentation

## 2011-10-24 NOTE — Progress Notes (Signed)
Discharge summary [phone]

## 2011-10-25 ENCOUNTER — Ambulatory Visit: Payer: Medicare Other | Admitting: Physical Therapy

## 2011-10-25 NOTE — Discharge Summary (Signed)
NAMEKAREY, STUCKI                 ACCOUNT NO.:  [id number]  MEDICAL RECORD NO.:  [id number]  LOCATION:  4004                         FACILITY:  [facility]  PHYSICIAN:  Ranelle Oyster, M.D.DATE OF BIRTH:  1933-06-23  DATE OF ADMISSION:  10/09/2011 DATE OF DISCHARGE:  10/16/2011                              DISCHARGE SUMMARY   DISCHARGE DIAGNOSES: 1. Left subdural hemorrhage. 2. Acute-on-chronic right rotator cuff injury. 3. Diabetes mellitus type 2. 4. Hypertension. 5. Bilateral rotator cuff pathology. 6. Severe multilevel cervical stenosis. 7. Diabetic neuropathy.  HISTORY OF PRESENT ILLNESS:  Mr. Joshua Glass is a 78 year old male with history of diabetes mellitus with peripheral neuropathy who was found unconscious by wife on October 05, 2011, with amnesia of events leading to fall.  CT of head done past-admission revealed small left interhemispheric acute subdural hemorrhage.  He was evaluated by Dr. Newell Coral who recommended conservative care with monitoring.  The patient had severe shoulder pain and MRI of C-spine done, showed advanced multilevel cervical spondylosis with DDD.  MRI of right shoulder done showed complete chronic right rotator cuff tear with evidence of bursitis.  Neurosurgery recommends follow up on outpatient to discuss surgical options.  The patient also to follow up with Orthopedics for input on shoulder pain past-discharge.  Therapies were initiated and the patient is noted to have problems with mobility and self-care tasks. Rehab was consulted for progressive therapies.  PAST MEDICAL HISTORY: 1. DM type 2. 2. Allergic rhinitis. 3. Obstructive sleep apnea. 4. Diabetic peripheral neuropathy. 5. Carpal tunnel syndrome, bilaterally. 6. Obesity. 7. History of crush injury, left forearm with ORIF and nerve injury. 8. Bilateral total knee replacements.  FUNCTIONAL HISTORY:  The patient was independent prior to admission.  He was able to navigate stairs without  difficulty.  Does not drive anymore due to peripheral neuropathy.  FUNCTIONAL STATUS:  The patient is min-assist for transfers, min-assist for ambulating 30 feet with rolling walker with cues for posture.  He was requiring max-assist for upper body care, +1 total-assist for lower body care and moderate-assist for grooming.  HOSPITAL COURSE:  Mr. Robin Pafford was admitted to rehab on October 09, 2011, for inpatient therapies to consist of PT, OT, and speech therapy at least 3 hours 5 days a week.  Past-admission, physiatrist, rehab RN, and therapy team have worked together to provide customized collaborative interdisciplinary care.  Rehab RN has worked with the patient on bowel and bladder program as well as assisted with med administration for pain management.  The patient's blood sugars were checked on before meal and at bedtime basis during this stay.  These were reasonable ranging from 140s-150s range.  The patient reports this is a much improved compared to his prior records.  Blood pressure at time of discharge was at 151/81.  Labs done past-admission revealed mild elevation in LFTs with AST at 58, ALT 21, albumin 2.8.  Check of lytes revealed sodium 137, potassium 4.1, chloride 100, CO2 28, BUN 16, creatinine 0.83.  Check of CBC revealed hemoglobin at 13.6, hematocrit 39.8, white count 10.0, platelets 259.  The patient has had issues with shoulder pain that was treated with use of Voltaren gel  as well as heat and p.r.n. narcotics.  His shoulder pain had greatly improved by time of discharge.  The patient has been very motivated and has made good progress during this stay.  During the patient's stay in rehab, weekly team conferences were held to monitor the patient's progress, set goals, as well as discuss barriers to discharge.  OT has worked with the patient on self-care tasks as well as upper extremity strengthening and fine motor movement.  By the time of discharge, the patient was  showing improvement in functional use of right upper and lower extremity.  He continue to require increased time to complete tasks and required rest breaks appropriately during tasks.  He was modified independent for bathing, dressing, toilet transfers, and toileting.  He requires supervision for stepping into the tub using grab bars.  Wife was educated regarding providing necessary physical and cognitive assistance past-discharge.  Speech Therapy has worked with the patient on utilizing memory strategies to help with memory.  The patient was modified independent to supervision level for utilizing the strategies.  He was showing improvement in attention and functional problem-solving.  No further speech therapy needs past-discharge.  Physical Therapy has worked with the patient on balance, activity tolerance as well as overall awareness.  The patient was at modified independent level for transfers and ambulation.  He required supervision for navigation of stairs.  He required min-assist for slow transfer.  Recommendations were made for the patient to use cane in community setting and had min-assist for stairs with railings were not available.  The patient did demonstrate some vestibular symptoms with transitional movement, and he is setup for vestibular evaluation on outpatient basis.  On October 16, 2011, the patient is discharged to home.  DISCHARGE MEDICATIONS: 1. Diclofenac gel to right shoulders t.i.d. 2. Colace 100 mg b.i.d. 3. Norco 5-325 1-2 p.o. q.6 hours p.r.n. pain. 4. NovoLog 4 units subcu t.i.d. 5. Levemir 25 units in a.m. and 5 units in p.m. 6. Sports cream to left shoulders q.i.d. 7. Losartan 50 mg a day. 8. Omeprazole 40 mg per day.  DIET:  Diabetic diet.  ACTIVITY LEVEL:  May ambulate in house independently. Intermittent supervision assistance as needed.  Walk with use of cane.  No alcohol.  SPECIAL INSTRUCTIONS:  Check blood sugars before meals and at bedtime and record.  Neuro  rehab physical therapy to begin on October 23, 2011, at 1:45 p.m.  FOLLOWUP:  The patient to follow up with Dr. Faith Rogue on October 16, 2011 at 8:50 for 9:20 appointment.  Follow up with Dr. Burton Apley on October 21, 2011, at 1 p.m.  Follow up with Dr. Newell Coral in 3-4 weeks for discussion regarding cervical surgery.  Follow up with Dr. Lequita Halt in 2 weeks for input on rotator cuff and shoulder problems.     Delle Reining, P.A.   ______________________________ Ranelle Oyster, M.D.    PL/MEDQ  D:  10/24/2011  T:  10/25/2011  Job:  [id number]  cc:   Ollen Gross, M.D. Antony Madura, M.D. Hewitt Shorts, M.D.

## 2011-10-28 ENCOUNTER — Ambulatory Visit: Payer: Medicare Other | Admitting: Physical Therapy

## 2011-10-29 ENCOUNTER — Other Ambulatory Visit (HOSPITAL_COMMUNITY): Payer: Self-pay | Admitting: Neurosurgery

## 2011-10-29 DIAGNOSIS — S065X9A Traumatic subdural hemorrhage with loss of consciousness of unspecified duration, initial encounter: Secondary | ICD-10-CM

## 2011-11-01 ENCOUNTER — Ambulatory Visit: Payer: Medicare Other | Admitting: Rehabilitative and Restorative Service Providers"

## 2011-11-04 ENCOUNTER — Encounter: Payer: Medicare Other | Admitting: Rehabilitative and Restorative Service Providers"

## 2011-11-04 ENCOUNTER — Ambulatory Visit (HOSPITAL_COMMUNITY)
Admission: RE | Admit: 2011-11-04 | Discharge: 2011-11-04 | Disposition: A | Discharge status: Home / Self Care | Payer: Medicare Other | Source: Ambulatory Visit | Attending: Neurosurgery | Admitting: Neurosurgery

## 2011-11-04 DIAGNOSIS — S065X9A Traumatic subdural hemorrhage with loss of consciousness of unspecified duration, initial encounter: Secondary | ICD-10-CM

## 2011-11-04 DIAGNOSIS — R51 Headache: Secondary | ICD-10-CM | POA: Insufficient documentation

## 2011-11-04 DIAGNOSIS — Z9181 History of falling: Secondary | ICD-10-CM | POA: Insufficient documentation

## 2011-11-07 ENCOUNTER — Ambulatory Visit: Payer: Medicare Other | Admitting: Rehabilitative and Restorative Service Providers"

## 2011-11-08 ENCOUNTER — Ambulatory Visit: Payer: Medicare Other | Admitting: Rehabilitative and Restorative Service Providers"

## 2011-11-11 ENCOUNTER — Ambulatory Visit: Payer: Medicare Other | Admitting: Rehabilitative and Restorative Service Providers"

## 2011-11-14 ENCOUNTER — Ambulatory Visit: Payer: Medicare Other | Admitting: Rehabilitative and Restorative Service Providers"

## 2011-11-18 ENCOUNTER — Ambulatory Visit: Payer: Medicare Other | Admitting: Rehabilitative and Restorative Service Providers"

## 2011-11-20 ENCOUNTER — Ambulatory Visit
Payer: Medicare Other | Attending: Physical Medicine & Rehabilitation | Admitting: Rehabilitative and Restorative Service Providers"

## 2011-11-20 DIAGNOSIS — H8110 Benign paroxysmal vertigo, unspecified ear: Secondary | ICD-10-CM | POA: Insufficient documentation

## 2011-11-20 DIAGNOSIS — IMO0001 Reserved for inherently not codable concepts without codable children: Secondary | ICD-10-CM | POA: Insufficient documentation

## 2011-11-20 DIAGNOSIS — R269 Unspecified abnormalities of gait and mobility: Secondary | ICD-10-CM | POA: Insufficient documentation

## 2011-11-25 ENCOUNTER — Ambulatory Visit: Payer: Medicare Other | Admitting: Rehabilitative and Restorative Service Providers"

## 2011-11-27 ENCOUNTER — Ambulatory Visit: Payer: Medicare Other | Admitting: Rehabilitative and Restorative Service Providers"

## 2011-12-02 ENCOUNTER — Emergency Department (HOSPITAL_COMMUNITY)
Admission: EM | Admit: 2011-12-02 | Discharge: 2011-12-02 | Disposition: A | Discharge status: Home / Self Care | Payer: Medicare Other | Attending: Emergency Medicine | Admitting: Emergency Medicine

## 2011-12-02 ENCOUNTER — Encounter (HOSPITAL_COMMUNITY): Payer: Self-pay | Admitting: Family Medicine

## 2011-12-02 ENCOUNTER — Encounter: Payer: Medicare Other | Admitting: Rehabilitative and Restorative Service Providers"

## 2011-12-02 ENCOUNTER — Emergency Department (HOSPITAL_COMMUNITY): Payer: Medicare Other

## 2011-12-02 DIAGNOSIS — E119 Type 2 diabetes mellitus without complications: Secondary | ICD-10-CM | POA: Insufficient documentation

## 2011-12-02 DIAGNOSIS — R531 Weakness: Secondary | ICD-10-CM

## 2011-12-02 DIAGNOSIS — I10 Essential (primary) hypertension: Secondary | ICD-10-CM | POA: Insufficient documentation

## 2011-12-02 DIAGNOSIS — Z794 Long term (current) use of insulin: Secondary | ICD-10-CM | POA: Insufficient documentation

## 2011-12-02 DIAGNOSIS — Z79899 Other long term (current) drug therapy: Secondary | ICD-10-CM | POA: Insufficient documentation

## 2011-12-02 DIAGNOSIS — R5381 Other malaise: Secondary | ICD-10-CM | POA: Insufficient documentation

## 2011-12-02 DIAGNOSIS — I4891 Unspecified atrial fibrillation: Secondary | ICD-10-CM | POA: Insufficient documentation

## 2011-12-02 DIAGNOSIS — G4733 Obstructive sleep apnea (adult) (pediatric): Secondary | ICD-10-CM | POA: Insufficient documentation

## 2011-12-02 HISTORY — DX: Unspecified atrial fibrillation: I48.91

## 2011-12-02 LAB — COMPREHENSIVE METABOLIC PANEL
ALT: 21 U/L (ref 0–53)
AST: 26 U/L (ref 0–37)
Alkaline Phosphatase: 76 U/L (ref 39–117)
CO2: 24 mEq/L (ref 19–32)
Chloride: 98 mEq/L (ref 96–112)
GFR calc non Af Amer: 81 mL/min — ABNORMAL LOW (ref >90)
Sodium: 134 mEq/L — ABNORMAL LOW (ref 135–145)
Total Bilirubin: 1.2 mg/dL (ref 0.3–1.2)

## 2011-12-02 LAB — DIFFERENTIAL
Basophils Absolute: 0 10*3/uL (ref 0.0–0.1)
Basophils Relative: 0 % (ref 0–1)
Eosinophils Relative: 0 % (ref 0–5)
Monocytes Absolute: 0.7 10*3/uL (ref 0.1–1.0)

## 2011-12-02 LAB — CBC
MCV: 85.3 fL (ref 78.0–100.0)
Platelets: 270 10*3/uL (ref 150–400)
RBC: 4.77 MIL/uL (ref 4.22–5.81)
WBC: 9.1 10*3/uL (ref 4.0–10.5)

## 2011-12-02 LAB — URINALYSIS, ROUTINE W REFLEX MICROSCOPIC
Glucose, UA: NEGATIVE mg/dL
Ketones, ur: NEGATIVE mg/dL
Leukocytes, UA: NEGATIVE
Protein, ur: NEGATIVE mg/dL

## 2011-12-02 MED ORDER — SODIUM CHLORIDE 0.9 % IV SOLN
Freq: Once | INTRAVENOUS | Status: AC
  Administered 2011-12-02: 09:00:00 via INTRAVENOUS

## 2011-12-02 MED ORDER — SODIUM CHLORIDE 0.9 % IV BOLUS (SEPSIS)
1000.0000 mL | Freq: Once | INTRAVENOUS | Status: AC
  Administered 2011-12-02: 1000 mL via INTRAVENOUS

## 2011-12-02 NOTE — ED Notes (Signed)
ZOX:[id number]<AV> Expected date:12/02/11<BR> Expected time: 8:00 AM<BR> Means of arrival:Ambulance<BR> Comments:<BR> weakness

## 2011-12-02 NOTE — Discharge Instructions (Signed)
Fatigue Fatigue is a feeling of tiredness, lack of energy, lack of motivation, or feeling tired all the time. Having enough rest, good nutrition, and reducing stress will normally reduce fatigue. Consult your caregiver if it persists. The nature of your fatigue will help your caregiver to find out its cause. The treatment is based on the cause.  CAUSES  There are many causes for fatigue. Most of the time, fatigue can be traced to one or more of your habits or routines. Most causes fit into one or more of three general areas. They are: Lifestyle problems  Sleep disturbances.   Overwork.   Physical exertion.   Unhealthy habits.   Poor eating habits or eating disorders.   Alcohol and/or drug use .   Lack of proper nutrition (malnutrition).  Psychological problems  Stress and/or anxiety problems.   Depression.   Grief.   Boredom.  Medical Problems or Conditions  Anemia.   Pregnancy.   Thyroid gland problems.   Recovery from major surgery.   Continuous pain.   Emphysema or asthma that is not well controlled   Allergic conditions.   Diabetes.   Infections (such as mononucleosis).   Obesity.   Sleep disorders, such as sleep apnea.   Heart failure or other heart-related problems.   Cancer.   Kidney disease.   Liver disease.   Effects of certain medicines such as antihistamines, cough and cold remedies, prescription pain medicines, heart and blood pressure medicines, drugs used for treatment of cancer, and some antidepressants.  SYMPTOMS  The symptoms of fatigue include:   Lack of energy.   Lack of drive (motivation).   Drowsiness.   Feeling of indifference to the surroundings.  DIAGNOSIS  The details of how you feel help guide your caregiver in finding out what is causing the fatigue. You will be asked about your present and past health condition. It is important to review all medicines that you take, including prescription and non-prescription items. A  thorough exam will be done. You will be questioned about your feelings, habits, and normal lifestyle. Your caregiver may suggest blood tests, urine tests, or other tests to look for common medical causes of fatigue.  TREATMENT  Fatigue is treated by correcting the underlying cause. For example, if you have continuous pain or depression, treating these causes will improve how you feel. Similarly, adjusting the dose of certain medicines will help in reducing fatigue.  HOME CARE INSTRUCTIONS   Try to get the required amount of good sleep every night.   Eat a healthy and nutritious diet, and drink enough water throughout the day.   Practice ways of relaxing (including yoga or meditation).   Exercise regularly.   Make plans to change situations that cause stress. Act on those plans so that stresses decrease over time. Keep your work and personal routine reasonable.   Avoid street drugs and minimize use of alcohol.   Start taking a daily multivitamin after consulting your caregiver.  SEEK MEDICAL CARE IF:   You have persistent tiredness, which cannot be accounted for.   You have fever.   You have unintentional weight loss.   You have headaches.   You have disturbed sleep throughout the night.   You are feeling sad.   You have constipation.   You have dry skin.   You have gained weight.   You are taking any new or different medicines that you suspect are causing fatigue.   You are unable to sleep at night.     You develop any unusual swelling of your legs or other parts of your body.  SEEK IMMEDIATE MEDICAL CARE IF:   You are feeling confused.   Your vision is blurred.   You feel faint or pass out.   You develop severe headache.   You develop severe abdominal, pelvic, or back pain.   You develop chest pain, shortness of breath, or an irregular or fast heartbeat.   You are unable to pass a normal amount of urine.   You develop abnormal bleeding such as bleeding from  the rectum or you vomit blood.   You have thoughts about harming yourself or committing suicide.   You are worried that you might harm someone else.  MAKE SURE YOU:   Understand these instructions.   Will watch your condition.   Will get help right away if you are not doing well or get worse.  Document Released: 05/05/2007 Document Revised: 06/27/2011 Document Reviewed: 05/05/2007 [employer] Patient Information 2012 [employer].Weakness, Generalized Without Cause Your caregiver has seen you today because you are having problems with feelings of weakness. Weakness has many different causes, some of which are common and others are very rare. The causes of weakness are so numerous they could not all be listed on this page. The exam and other tests done today do not reveal a specific cause for the weakness that is an immediate danger or something that is treatable. Your caregiver has checked you for the most common causes of weakness and feels it is safe for you to go home and be observed. HOME CARE INSTRUCTIONS   For the time being, obtain more rest if needed.   Eat a well balanced diet.   Try to get at least some exercise every day in spite of how difficult it may seem at times. In the case of the elderly, exercise is especially important. As we grow older, there is a loss of muscle mass. Generally, there is also a loss of, or decrease in, activity that comes naturally with the aging process. Exercise and increased activities are the only tools we have to combat this natural process.   The results of some tests ordered today may not be available right away. You will be contacted with those results when they become available.   It is important to follow through with your physician as per instructions that you may have received today.  SEEK MEDICAL CARE IF:   You have any new concerns which you do not feel were dealt with today.   The weakness seems to be getting progressively worse.    You develop new or unusual aches or pains.  SEEK IMMEDIATE MEDICAL CARE IF:   You are unable to tend to your usual daily activities such as simply getting dressed, feeding yourself, or keeping up with your personal hygiene.   You develop inability to walk stairs or perform your usual daily activities.   You develop shortness of breath, chest pain, have difficulty moving parts of your body, or develop new problems for which you have not talked to your caregiver.   You experience difficulty speaking or swallowing.   You develop loss of control of bladder or bowels that was not present before.  Document Released: 07/08/2005 Document Revised: 06/27/2011 Document Reviewed: 12/18/2006 [employer] Patient Information 2012 [employer]. 
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
## 2011-12-02 NOTE — ED Notes (Signed)
Per EMS: States they were called out last night and pt had a tooth extraction yesterday and took pain medication, family called ems last night due to pt not acting right after taking pain meds, per ems pt was A/O VSS and pt did not come to hospital. States this morning pt felt generalized weakness, normally able to get around with a walker but more difficult today.

## 2011-12-02 NOTE — ED Notes (Signed)
Pt returned from xray

## 2011-12-02 NOTE — ED Notes (Signed)
MD at bedside. 

## 2011-12-02 NOTE — ED Notes (Signed)
Pt's family member given coffee 

## 2011-12-02 NOTE — ED Notes (Signed)
Patient transported to X-ray 

## 2011-12-02 NOTE — ED Notes (Signed)
Lab called and states blood was hemolyzed and needs to be redrawn. Morrie Sheldon, phlebotomist, notified.

## 2011-12-02 NOTE — ED Notes (Signed)
Pt reports feeling weak all over. Denies any pain or other sx. Pt is A/O x4. Skin warm and dry. Respirations even and unlabored. NAD noted at this time.

## 2011-12-03 ENCOUNTER — Encounter: Payer: Medicare Other | Admitting: Physical Medicine & Rehabilitation

## 2011-12-03 LAB — URINE CULTURE
Colony Count: NO GROWTH
Culture: NO GROWTH

## 2011-12-04 ENCOUNTER — Ambulatory Visit: Payer: Medicare Other | Admitting: Rehabilitative and Restorative Service Providers"

## 2011-12-09 ENCOUNTER — Encounter: Payer: Medicare Other | Admitting: Rehabilitative and Restorative Service Providers"

## 2011-12-11 ENCOUNTER — Encounter: Payer: Medicare Other | Admitting: Rehabilitative and Restorative Service Providers"

## 2012-01-01 ENCOUNTER — Encounter: Payer: Medicare Other | Admitting: Physical Medicine & Rehabilitation

## 2012-01-02 ENCOUNTER — Encounter: Payer: Medicare Other | Attending: Physical Medicine & Rehabilitation | Admitting: Physical Medicine and Rehabilitation

## 2012-01-02 ENCOUNTER — Encounter: Payer: Self-pay | Admitting: Physical Medicine and Rehabilitation

## 2012-01-02 VITALS — BP 182/92 | HR 74 | Resp 16 | Ht 68.0 in | Wt 247.0 lb

## 2012-01-02 DIAGNOSIS — E119 Type 2 diabetes mellitus without complications: Secondary | ICD-10-CM | POA: Insufficient documentation

## 2012-01-02 DIAGNOSIS — X58XXXA Exposure to other specified factors, initial encounter: Secondary | ICD-10-CM | POA: Insufficient documentation

## 2012-01-02 DIAGNOSIS — S43429A Sprain of unspecified rotator cuff capsule, initial encounter: Secondary | ICD-10-CM | POA: Insufficient documentation

## 2012-01-02 DIAGNOSIS — S065X9A Traumatic subdural hemorrhage with loss of consciousness of unspecified duration, initial encounter: Secondary | ICD-10-CM

## 2012-01-02 DIAGNOSIS — G609 Hereditary and idiopathic neuropathy, unspecified: Secondary | ICD-10-CM | POA: Insufficient documentation

## 2012-01-02 DIAGNOSIS — G629 Polyneuropathy, unspecified: Secondary | ICD-10-CM

## 2012-01-02 DIAGNOSIS — I6200 Nontraumatic subdural hemorrhage, unspecified: Secondary | ICD-10-CM | POA: Insufficient documentation

## 2012-01-02 NOTE — Patient Instructions (Signed)
Continue controlling your sugars, stay active.
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
## 2012-02-17 ENCOUNTER — Other Ambulatory Visit: Payer: Self-pay | Admitting: Internal Medicine

## 2012-02-17 DIAGNOSIS — R4182 Altered mental status, unspecified: Secondary | ICD-10-CM

## 2012-02-19 ENCOUNTER — Ambulatory Visit
Admission: RE | Admit: 2012-02-19 | Discharge: 2012-02-19 | Disposition: A | Discharge status: Home / Self Care | Payer: Medicare Other | Source: Ambulatory Visit | Attending: Internal Medicine | Admitting: Internal Medicine

## 2012-02-19 DIAGNOSIS — R4182 Altered mental status, unspecified: Secondary | ICD-10-CM

## 2012-03-30 ENCOUNTER — Encounter: Payer: Self-pay | Admitting: Internal Medicine

## 2012-03-30 ENCOUNTER — Ambulatory Visit (INDEPENDENT_AMBULATORY_CARE_PROVIDER_SITE_OTHER): Payer: Medicare Other | Admitting: Internal Medicine

## 2012-03-30 VITALS — BP 150/88 | HR 82 | Ht 70.0 in | Wt 251.0 lb

## 2012-03-30 DIAGNOSIS — G4733 Obstructive sleep apnea (adult) (pediatric): Secondary | ICD-10-CM

## 2012-03-30 NOTE — Progress Notes (Signed)
Subjective:    Patient ID: Joshua Glass, male    DOB: [birth year], 78 y.o.   MRN: [id number]  HPI 03/29/11- 78 year old male former smoker followed for obstructive sleep apnea, allergic rhinitis, complicated by DM, HBP. Last here -03/30/10- For the last 4-5 months he has felt no energy. He asks if this is age. He uses CPAP all night every night, and sleep quality feels the same to him, but doesn't know if he snores through. Avoids caffeine for heart rhythm. He is no more likely to doze off during the day that he was at last visit when he thought he was going well..  Denies heart or known thyroid problems. To see his PCP, Dr Marina Goodell, in October.  Flu vax talk.   03/30/12- 78 year old male former smoker followed for obstructive sleep apnea, allergic rhinitis, complicated by DM, HBP.  1 yr f/u--states he is not sleeping very well at night, wearing CPAP machine approx 7 hrs,  denies any other symptoms CPAP 17/ A[facility] nasal pillows mask. Pressure is comfortable. He is told that sometimes he snores through the mask, but nasal pillows and a higher pressure without more leak. Some daytime sleepiness he blames on medication and treats with naps Since last here fell > SDH  Review of Systems- see HPI Constitutional:   No-   weight loss, night sweats, fevers, chills,  + fatigue, lassitude. HEENT:   No-  headaches, difficulty swallowing, tooth/dental problems, sore throat,       No-  sneezing, itching, ear ache, nasal congestion, post nasal drip,  CV:  No-   chest pain, orthopnea, PND, swelling in lower extremities, anasarca, dizziness, palpitations Resp: No-   shortness of breath with exertion or at rest.              No-   productive cough,  No non-productive cough,  No-  coughing up of blood.              No-   change in color of mucus.  No- wheezing.   Skin: No-   rash or lesions. GI:  No-   heartburn, indigestion, abdominal pain, nausea, vomiting,  GU:  MS:  Joint pain,  Neuro- nothing unusual  Psych:  No-  change in mood or affect. No depression or anxiety.  No memory loss.    Objective:   Physical Exam General- Alert, Oriented, Affect-appropriate, Distress- none acute  Obese, passive  Skin- rash-none, lesions- none, excoriation- none Lymphadenopathy- none Head- atraumatic            Eyes- Gross vision intact, PERRLA, conjunctivae clear secretions            Ears- Hearing, canals normal            Nose- Clear, No- Septal dev, mucus, polyps, erosion, perforation             Throat- Mallampati III , mucosa clear , drainage- none, tonsils- atrophic Neck- flexible , trachea midline, no stridor , thyroid nl, carotid no bruit Chest - symmetrical excursion , unlabored           Heart/CV- RRR , no murmur , no gallop  , no rub, nl s1 s2                           - JVD- none , edema- none, stasis changes- none, varices- none           Lung- clear to P&A, wheeze- none,  cough- none , dullness-none, rub- none           Chest wall-  Abd-  Br/ Gen/ Rectal- Not done, not indicated Extrem- cyanosis- none, clubbing, none, atrophy- none, strength- nl, cane Neuro- grossly intact to observatio    Assessment & Plan:

## 2012-03-30 NOTE — Patient Instructions (Addendum)
Continue CPAP 17/ Advanced    [facility] can ask the [facility] daytime staff to help with refitting a more comfortable CPAP mask if you ever want to look into it.  Flu vax  Please call as needed

## 2012-04-05 NOTE — Assessment & Plan Note (Addendum)
He wants to continue with his nasal pillows mask. We discussed possibility that he might feel better rested if we could turn the pressure a little higher but his mask will leak more than he wants at higher pressures. Plan-recommend short nap routinely after lunch. Weight loss

## 2012-06-15 ENCOUNTER — Ambulatory Visit (HOSPITAL_BASED_OUTPATIENT_CLINIC_OR_DEPARTMENT_OTHER): Payer: Medicare Other | Attending: Internal Medicine

## 2012-06-15 ENCOUNTER — Telehealth: Payer: Self-pay | Admitting: Internal Medicine

## 2012-06-15 DIAGNOSIS — G4733 Obstructive sleep apnea (adult) (pediatric): Secondary | ICD-10-CM

## 2012-06-15 NOTE — Telephone Encounter (Signed)
Per last OV note CY rec were as follows: We can ask the [facility] daytime staff to help with refitting a more comfortable CPAP mask if you ever want to look into it.   Pt spouse is calling stating the pt wants to go ahead and get this set-up because he is still having issues with mask fit. Order has been placed. Pt spouse is aware. Carron Curie, CMA

## 2012-06-16 ENCOUNTER — Telehealth: Payer: Self-pay | Admitting: Internal Medicine

## 2012-06-16 NOTE — Telephone Encounter (Signed)
I spoke with pt and he stated he wants a wants a new cpap machine but does not qualify for a new once bc he has not had his but only for 3 years. He stated he went over to the sleep lab for a mask fit yesterday. The mask he tried on and sleep with last night did not leak. He will give this a try and see how it goes.  Pt will call back if he has any further problems. Nothing further was needed

## 2012-06-23 ENCOUNTER — Other Ambulatory Visit: Payer: Self-pay | Admitting: Internal Medicine

## 2012-06-23 ENCOUNTER — Ambulatory Visit
Admission: RE | Admit: 2012-06-23 | Discharge: 2012-06-23 | Disposition: A | Discharge status: Home / Self Care | Payer: Medicare Other | Source: Ambulatory Visit | Attending: Internal Medicine | Admitting: Internal Medicine

## 2012-06-23 DIAGNOSIS — R05 Cough: Secondary | ICD-10-CM

## 2013-01-28 ENCOUNTER — Emergency Department (HOSPITAL_COMMUNITY)
Admission: EM | Admit: 2013-01-28 | Discharge: 2013-01-28 | Disposition: A | Discharge status: Home / Self Care | Payer: PRIVATE HEALTH INSURANCE | Attending: Emergency Medicine | Admitting: Emergency Medicine

## 2013-01-28 ENCOUNTER — Emergency Department (HOSPITAL_COMMUNITY): Payer: PRIVATE HEALTH INSURANCE

## 2013-01-28 ENCOUNTER — Emergency Department (HOSPITAL_COMMUNITY)
Admission: EM | Admit: 2013-01-28 | Discharge: 2013-01-28 | Discharge status: Against Medical Advice | Payer: PRIVATE HEALTH INSURANCE

## 2013-01-28 DIAGNOSIS — W19XXXA Unspecified fall, initial encounter: Secondary | ICD-10-CM | POA: Insufficient documentation

## 2013-01-28 DIAGNOSIS — M25551 Pain in right hip: Secondary | ICD-10-CM

## 2013-01-28 DIAGNOSIS — Z96659 Presence of unspecified artificial knee joint: Secondary | ICD-10-CM | POA: Insufficient documentation

## 2013-01-28 DIAGNOSIS — Z87891 Personal history of nicotine dependence: Secondary | ICD-10-CM | POA: Insufficient documentation

## 2013-01-28 DIAGNOSIS — Z794 Long term (current) use of insulin: Secondary | ICD-10-CM | POA: Insufficient documentation

## 2013-01-28 DIAGNOSIS — G5600 Carpal tunnel syndrome, unspecified upper limb: Secondary | ICD-10-CM | POA: Insufficient documentation

## 2013-01-28 DIAGNOSIS — E1142 Type 2 diabetes mellitus with diabetic polyneuropathy: Secondary | ICD-10-CM | POA: Insufficient documentation

## 2013-01-28 DIAGNOSIS — Z7982 Long term (current) use of aspirin: Secondary | ICD-10-CM | POA: Insufficient documentation

## 2013-01-28 DIAGNOSIS — E669 Obesity, unspecified: Secondary | ICD-10-CM | POA: Insufficient documentation

## 2013-01-28 DIAGNOSIS — Y9389 Activity, other specified: Secondary | ICD-10-CM | POA: Insufficient documentation

## 2013-01-28 DIAGNOSIS — R269 Unspecified abnormalities of gait and mobility: Secondary | ICD-10-CM | POA: Insufficient documentation

## 2013-01-28 DIAGNOSIS — Y92009 Unspecified place in unspecified non-institutional (private) residence as the place of occurrence of the external cause: Secondary | ICD-10-CM | POA: Insufficient documentation

## 2013-01-28 DIAGNOSIS — E1149 Type 2 diabetes mellitus with other diabetic neurological complication: Secondary | ICD-10-CM | POA: Insufficient documentation

## 2013-01-28 DIAGNOSIS — S79919A Unspecified injury of unspecified hip, initial encounter: Secondary | ICD-10-CM | POA: Insufficient documentation

## 2013-01-28 DIAGNOSIS — Z9181 History of falling: Secondary | ICD-10-CM | POA: Insufficient documentation

## 2013-01-28 DIAGNOSIS — Z79899 Other long term (current) drug therapy: Secondary | ICD-10-CM | POA: Insufficient documentation

## 2013-01-28 DIAGNOSIS — G4733 Obstructive sleep apnea (adult) (pediatric): Secondary | ICD-10-CM | POA: Insufficient documentation

## 2013-01-28 DIAGNOSIS — I10 Essential (primary) hypertension: Secondary | ICD-10-CM | POA: Insufficient documentation

## 2013-01-28 LAB — CBC WITH DIFFERENTIAL/PLATELET
Basophils Absolute: 0.1 10*3/uL (ref 0.0–0.1)
Basophils Relative: 1 % (ref 0–1)
HCT: 38.1 % — ABNORMAL LOW (ref 39.0–52.0)
Hemoglobin: 12.9 g/dL — ABNORMAL LOW (ref 13.0–17.0)
Lymphocytes Relative: 25 % (ref 12–46)
MCHC: 33.9 g/dL (ref 30.0–36.0)
Neutro Abs: 8.2 10*3/uL — ABNORMAL HIGH (ref 1.7–7.7)
Neutrophils Relative %: 63 % (ref 43–77)
RDW: 14.8 % (ref 11.5–15.5)
WBC: 13.2 10*3/uL — ABNORMAL HIGH (ref 4.0–10.5)

## 2013-01-28 LAB — BASIC METABOLIC PANEL
CO2: 26 mEq/L (ref 19–32)
Chloride: 103 mEq/L (ref 96–112)
GFR calc Af Amer: >90 mL/min (ref >90)
Potassium: 3.5 mEq/L (ref 3.5–5.1)

## 2013-01-28 LAB — URINALYSIS, ROUTINE W REFLEX MICROSCOPIC
Glucose, UA: NEGATIVE mg/dL
Ketones, ur: NEGATIVE mg/dL
Leukocytes, UA: NEGATIVE
Nitrite: NEGATIVE
Specific Gravity, Urine: 1.012 (ref 1.005–1.030)
pH: 7.5 (ref 5.0–8.0)

## 2013-01-28 LAB — POCT I-STAT TROPONIN I: Troponin i, poc: 0 ng/mL (ref 0.00–0.08)
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
## 2013-01-28 NOTE — ED Notes (Signed)
CBG resulted in blood work

## 2013-01-28 NOTE — ED Notes (Signed)
Pt reports that he has been falling a lot at home and went to PCP office and was told to come here for further work-up. Reports generalized weakness. Also right hip pain from where he fell about a week ago on concrete. Denies any chest pain SOB. Grip strengths equal at triage. Pt able to move all extremities. No neuro deficits noted.

## 2013-01-28 NOTE — ED Notes (Signed)
Pt ambulated in hallway using walker with slow, steady gait. HR-110, O2-94% RA.

## 2013-01-30 NOTE — ED Provider Notes (Signed)
Medical screening examination/treatment/procedure(s) were performed by non-physician practitioner and as supervising physician I was immediately available for consultation/collaboration.  Yaritzi Craun R. Aqsa Sensabaugh, MD 01/30/13 1401 

## 2013-02-22 ENCOUNTER — Ambulatory Visit: Payer: PRIVATE HEALTH INSURANCE | Attending: Internal Medicine | Admitting: Physical Therapy

## 2013-02-22 DIAGNOSIS — R269 Unspecified abnormalities of gait and mobility: Secondary | ICD-10-CM | POA: Insufficient documentation

## 2013-02-22 DIAGNOSIS — Z9181 History of falling: Secondary | ICD-10-CM | POA: Insufficient documentation

## 2013-02-22 DIAGNOSIS — IMO0001 Reserved for inherently not codable concepts without codable children: Secondary | ICD-10-CM | POA: Insufficient documentation

## 2013-02-23 ENCOUNTER — Ambulatory Visit: Payer: PRIVATE HEALTH INSURANCE | Admitting: Physical Therapy

## 2013-02-23 ENCOUNTER — Ambulatory Visit: Payer: PRIVATE HEALTH INSURANCE

## 2013-02-25 ENCOUNTER — Other Ambulatory Visit: Payer: Self-pay

## 2013-02-25 ENCOUNTER — Ambulatory Visit: Payer: PRIVATE HEALTH INSURANCE

## 2013-03-01 ENCOUNTER — Ambulatory Visit: Payer: PRIVATE HEALTH INSURANCE | Admitting: Physical Therapy

## 2013-03-03 ENCOUNTER — Ambulatory Visit: Payer: PRIVATE HEALTH INSURANCE | Admitting: Physical Therapy

## 2013-03-09 ENCOUNTER — Ambulatory Visit: Payer: PRIVATE HEALTH INSURANCE | Admitting: Physical Therapy

## 2013-03-11 ENCOUNTER — Ambulatory Visit: Payer: PRIVATE HEALTH INSURANCE

## 2013-03-15 ENCOUNTER — Ambulatory Visit: Payer: PRIVATE HEALTH INSURANCE | Admitting: Physical Therapy

## 2013-03-17 ENCOUNTER — Ambulatory Visit: Payer: PRIVATE HEALTH INSURANCE | Admitting: Physical Therapy

## 2013-03-23 ENCOUNTER — Ambulatory Visit: Payer: PRIVATE HEALTH INSURANCE | Attending: Internal Medicine | Admitting: Physical Therapy

## 2013-03-23 DIAGNOSIS — R269 Unspecified abnormalities of gait and mobility: Secondary | ICD-10-CM | POA: Insufficient documentation

## 2013-03-23 DIAGNOSIS — Z9181 History of falling: Secondary | ICD-10-CM | POA: Insufficient documentation

## 2013-03-23 DIAGNOSIS — IMO0001 Reserved for inherently not codable concepts without codable children: Secondary | ICD-10-CM | POA: Insufficient documentation

## 2013-03-25 ENCOUNTER — Ambulatory Visit: Payer: PRIVATE HEALTH INSURANCE | Admitting: Physical Therapy

## 2013-03-29 ENCOUNTER — Ambulatory Visit: Payer: PRIVATE HEALTH INSURANCE | Admitting: Physical Therapy

## 2013-03-31 ENCOUNTER — Ambulatory Visit (INDEPENDENT_AMBULATORY_CARE_PROVIDER_SITE_OTHER): Payer: PRIVATE HEALTH INSURANCE | Admitting: Internal Medicine

## 2013-03-31 ENCOUNTER — Encounter: Payer: Self-pay | Admitting: Internal Medicine

## 2013-03-31 ENCOUNTER — Ambulatory Visit: Payer: PRIVATE HEALTH INSURANCE | Admitting: Physical Therapy

## 2013-03-31 ENCOUNTER — Ambulatory Visit: Payer: Medicare Other | Admitting: Pulmonary Disease

## 2013-03-31 VITALS — BP 150/82 | HR 75 | Ht 69.0 in | Wt 243.6 lb

## 2013-03-31 DIAGNOSIS — Z23 Encounter for immunization: Secondary | ICD-10-CM

## 2013-03-31 DIAGNOSIS — G4733 Obstructive sleep apnea (adult) (pediatric): Secondary | ICD-10-CM

## 2013-03-31 MED ORDER — ZALEPLON 5 MG PO CAPS: ORAL_CAPSULE | ORAL | Status: DC

## 2013-03-31 NOTE — Patient Instructions (Addendum)
Order- DME Advanced autotitrate CPAP 5-20 cwp x 7 days for pressure/ compliance check    Dx OSA  Script to try Sonata generic to help you sleep more soundly at night when needed. See if this lets you feel better rested in daytime.   Flu vax

## 2013-03-31 NOTE — Progress Notes (Signed)
Subjective:    Patient ID: Joshua Glass, male    DOB: 26-May-1933, 79 y.o.   MRN: [id number]  HPI 03/29/11- 79 year old male former smoker followed for obstructive sleep apnea, allergic rhinitis, complicated by DM, HBP. Last here -03/30/10- For the last 4-5 months he has felt no energy. He asks if this is age. He uses CPAP all night every night, and sleep quality feels the same to him, but doesn't know if he snores through. Avoids caffeine for heart rhythm. He is no more likely to doze off during the day that he was at last visit when he thought he was going well..  Denies heart or known thyroid problems. To see his PCP, Dr Marina Goodell, in October.  Flu vax talk.   03/30/12- 79 year old male former smoker followed for obstructive sleep apnea, allergic rhinitis, complicated by DM, HBP.  1 yr f/u--states he is not sleeping very well at night, wearing CPAP machine approx 7 hrs,  denies any other symptoms CPAP 17/ AHC nasal pillows mask. Pressure is comfortable. He is told that sometimes he snores through the mask, but nasal pillows and a higher pressure without more leak. Some daytime sleepiness he blames on medication and treats with naps Since last here fell > SDH  03/31/13-  79 year old male former smoker followed for obstructive sleep apnea, allergic rhinitis, complicated by DM, HBP. FOLLOWS FOR:  Wear CPAP 17/ / Advanced 6 hours per night.  Discuss new mask and possible machine.  Wife concned he is not really sleeping waking up not well rested, would like to know could he be tested Never got good mask fit. Stays sleepy. Restless insomnia, not uncomfortable. No longer drives. Tends to fall backwards.  Review of Systems- see HPI Constitutional:   No-   weight loss, night sweats, fevers, chills,  + fatigue, lassitude. HEENT:   No-  headaches, difficulty swallowing, tooth/dental problems, sore throat,       No-  sneezing, itching, ear ache, nasal congestion, post nasal drip,  CV:  No-   chest pain,  orthopnea, PND, swelling in lower extremities, anasarca, dizziness, palpitations Resp: No-   shortness of breath with exertion or at rest.              No-   productive cough,  No non-productive cough,  No-  coughing up of blood.              No-   change in color of mucus.  No- wheezing.   Skin: No-   rash or lesions. GI:  No-   heartburn, indigestion, abdominal pain, nausea, vomiting,  GU:  MS:  Joint pain,  Neuro- nothing unusual  Psych:  No- change in mood or affect. No depression or anxiety.  No memory loss.    Objective:   Physical Exam General- Alert, Oriented, Affect-appropriate, Distress- none acute  Obese, passive  Skin- rash-none, lesions- none, excoriation- none Lymphadenopathy- none Head- atraumatic            Eyes- Gross vision intact, PERRLA, conjunctivae clear secretions            Ears- Hearing, canals normal            Nose- Clear, No- Septal dev, mucus, polyps, erosion, perforation             Throat- Mallampati III , mucosa clear , drainage- none, tonsils- atrophic Neck- flexible , trachea midline, no stridor , thyroid nl, carotid no bruit Chest - symmetrical excursion , unlabored  Heart/CV- RRR , no murmur , no gallop  , no rub, nl s1 s2                           - JVD- none , edema- none, stasis changes- none, varices- none           Lung- clear to P&A, wheeze- none, cough- none , dullness-none, rub- none           Chest wall-  Abd-  Br/ Gen/ Rectal- Not done, not indicated Extrem- cyanosis- none, clubbing, none, atrophy- none, strength- nl, +walker Neuro- grossly intact to observatio    Assessment & Plan:

## 2013-04-06 ENCOUNTER — Ambulatory Visit: Payer: PRIVATE HEALTH INSURANCE | Admitting: Physical Therapy

## 2013-04-08 ENCOUNTER — Ambulatory Visit: Payer: PRIVATE HEALTH INSURANCE | Admitting: Physical Therapy

## 2013-04-09 ENCOUNTER — Ambulatory Visit: Payer: PRIVATE HEALTH INSURANCE | Admitting: Physical Therapy

## 2013-04-09 NOTE — Assessment & Plan Note (Signed)
Obstructive sleep apnea with insomnia. Suspect an organic/dementia related insomnia component Plan-auto titrate for pressure recommendation. Try Sonata 5 mg. Flu vaccine

## 2013-04-13 ENCOUNTER — Ambulatory Visit: Payer: PRIVATE HEALTH INSURANCE | Admitting: Physical Therapy

## 2013-04-15 ENCOUNTER — Ambulatory Visit: Payer: PRIVATE HEALTH INSURANCE | Admitting: Physical Therapy

## 2013-04-20 ENCOUNTER — Encounter: Payer: Self-pay | Admitting: Neurology

## 2013-04-22 ENCOUNTER — Encounter (INDEPENDENT_AMBULATORY_CARE_PROVIDER_SITE_OTHER): Payer: Self-pay | Admitting: Radiology

## 2013-04-22 ENCOUNTER — Encounter: Payer: Self-pay | Admitting: Neurology

## 2013-04-22 ENCOUNTER — Ambulatory Visit (INDEPENDENT_AMBULATORY_CARE_PROVIDER_SITE_OTHER): Payer: PRIVATE HEALTH INSURANCE | Admitting: Neurology

## 2013-04-22 VITALS — BP 199/94 | HR 95 | Ht 69.0 in | Wt 246.0 lb

## 2013-04-22 DIAGNOSIS — G20 Parkinson's disease: Secondary | ICD-10-CM

## 2013-04-22 DIAGNOSIS — G20A1 Parkinson's disease without dyskinesia, without mention of fluctuations: Secondary | ICD-10-CM

## 2013-04-22 DIAGNOSIS — Z0289 Encounter for other administrative examinations: Secondary | ICD-10-CM

## 2013-04-22 DIAGNOSIS — E1142 Type 2 diabetes mellitus with diabetic polyneuropathy: Secondary | ICD-10-CM

## 2013-04-22 DIAGNOSIS — R269 Unspecified abnormalities of gait and mobility: Secondary | ICD-10-CM

## 2013-04-22 DIAGNOSIS — D518 Other vitamin B12 deficiency anemias: Secondary | ICD-10-CM

## 2013-04-22 HISTORY — DX: Type 2 diabetes mellitus with diabetic polyneuropathy: E11.42

## 2013-04-22 HISTORY — DX: Parkinson's disease without dyskinesia, without mention of fluctuations: G20.A1

## 2013-04-22 HISTORY — DX: Unspecified abnormalities of gait and mobility: R26.9

## 2013-04-22 HISTORY — DX: Parkinson's disease: G20

## 2013-04-22 MED ORDER — CARBIDOPA-LEVODOPA 25-100 MG PO TABS: ORAL_TABLET | ORAL | Status: DC

## 2013-04-22 NOTE — Patient Instructions (Signed)
With the carbidopa medication, look out for dizziness, drowsiness, confusion, and nausea

## 2013-04-22 NOTE — Procedures (Signed)
  HISTORY:  Joshua Glass is a 79 year old gentleman with a 7 or 8 year history of a peripheral neuropathy associated with numbness in the legs. The patient has a history of diabetes, and he has developed a change in his gait stability. The patient is being evaluated for his balance issue.  NERVE CONDUCTION STUDIES:  Nerve conduction studies were performed on the right upper extremity. The distal motor latency for the right median nerve was prolonged, with a slightly low motor amplitude. The distal motor latency for the right ulnar nerve was normal, with a normal motor amplitude. The F wave latencies for the right median and ulnar nerves were normal, with slight slowing seen for the right median nerve, normal nerve conduction velocities for the right ulnar nerve. The sensory latency for the right median nerve was slightly prolonged, normal for the right ulnar nerve.  Nerve conduction studies were performed on both lower extremities. No response was seen for the peroneal and posterior tibial nerves on the right. The distal motor latencies for the right peroneal and posterior tibial nerves were normal on the left with low motor amplitudes for these nerves. More proximal stimulation resulted in no response. Nerve conduction velocities could not be calculated. The sural and peroneal sensory latencies were absent bilaterally.  EMG STUDIES:  EMG evaluation was not performed.  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 The presence of significant peripheral edema makes this study technically difficult, however. There is evidence of an overlying mild right carpal tunnel syndrome.  Marlan Palau MD 04/22/2013 5:03 PM  [facility] [address] [place], [place] [address]  Phone [phone] Fax [phone]
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
## 2013-04-23 LAB — IFE AND PE, SERUM
Albumin SerPl Elph-Mcnc: 3.6 g/dL (ref 3.2–5.6)
Alpha 1: 0.3 g/dL (ref 0.1–0.4)
Alpha2 Glob SerPl Elph-Mcnc: 0.9 g/dL (ref 0.4–1.2)
Gamma Glob SerPl Elph-Mcnc: 1.2 g/dL (ref 0.5–1.6)
IgA/Immunoglobulin A, Serum: 624 mg/dL — ABNORMAL HIGH (ref 91–414)

## 2013-04-23 LAB — ANGIOTENSIN CONVERTING ENZYME: Angio Convert Enzyme: 53 U/L (ref 14–82)

## 2013-04-26 NOTE — Progress Notes (Signed)
Quick Note:  I called pt and relayed the lab results. No changes. The pt and wife verbalized understanding. ______

## 2013-05-18 ENCOUNTER — Ambulatory Visit (INDEPENDENT_AMBULATORY_CARE_PROVIDER_SITE_OTHER): Payer: PRIVATE HEALTH INSURANCE | Admitting: Internal Medicine

## 2013-05-18 ENCOUNTER — Encounter: Payer: Self-pay | Admitting: Internal Medicine

## 2013-05-18 VITALS — BP 130/78 | HR 66 | Ht 69.0 in | Wt 245.0 lb

## 2013-05-18 DIAGNOSIS — G4733 Obstructive sleep apnea (adult) (pediatric): Secondary | ICD-10-CM

## 2013-05-18 NOTE — Progress Notes (Signed)
Subjective:    Patient ID: Joshua Glass, male    DOB: 1933/01/23, 79 y.o.   MRN: [id number]  HPI 03/29/11- 79 year old male former smoker followed for obstructive sleep apnea, allergic rhinitis, complicated by DM, HBP. Last here -03/30/10- For the last 4-5 months he has felt no energy. He asks if this is age. He uses CPAP all night every night, and sleep quality feels the same to him, but doesn't know if he snores through. Avoids caffeine for heart rhythm. He is no more likely to doze off during the day that he was at last visit when he thought he was going well..  Denies heart or known thyroid problems. To see his PCP, Dr Marina Goodell, in October.  Flu vax talk.   03/30/12- 79 year old male former smoker followed for obstructive sleep apnea, allergic rhinitis, complicated by DM, HBP.  1 yr f/u--states he is not sleeping very well at night, wearing CPAP machine approx 7 hrs,  denies any other symptoms CPAP 17/ [facility] nasal pillows mask. Pressure is comfortable. He is told that sometimes he snores through the mask, but nasal pillows and a higher pressure without more leak. Some daytime sleepiness he blames on medication and treats with naps Since last here fell > SDH  03/31/13-  79 year old male former smoker followed for obstructive sleep apnea, allergic rhinitis, complicated by DM, HBP. FOLLOWS FOR:  Wear CPAP 17/ / Advanced 6 hours per night.  Discuss new mask and possible machine.  Wife concned he is not really sleeping waking up not well rested, would like to know could he be tested Never got good mask fit. Stays sleepy. Restless insomnia, not uncomfortable. No longer drives. Tends to fall backwards.  05/18/13-  79 year old male former smoker followed for obstructive sleep apnea, allergic rhinitis, complicated by DM, HBP.  Wife here    Had flu vaccine FOLLOWS FOR: Wears CPAP 17/ Advanced every night for about 6-7 hours; pressure working well for patient. DME is [facility]. Download suggests 12 would be adequate,  but he is comfortable staying at 17 for now. Frequent nocturia. Diagnosed with Parkinson's disease/Dr. Anne Hahn.  Review of Systems- see HPI Constitutional:   No-   weight loss, night sweats, fevers, chills,  + fatigue, lassitude. HEENT:   No-  headaches, difficulty swallowing, tooth/dental problems, sore throat,       No-  sneezing, itching, ear ache, nasal congestion, post nasal drip,  CV:  No-   chest pain, orthopnea, PND, swelling in lower extremities, anasarca, dizziness, palpitations Resp: No-   shortness of breath with exertion or at rest.              No-   productive cough,  No non-productive cough,  No-  coughing up of blood.              No-   change in color of mucus.  No- wheezing.   Skin: No-   rash or lesions. GI:  No-   heartburn, indigestion, abdominal pain, nausea, vomiting,  GU:  MS:  Joint pain,  Neuro- nothing unusual  Psych:  No- change in mood or affect. No depression or anxiety.  No memory loss.    Objective:   Physical Exam General- Alert, Oriented, Affect-appropriate, Distress- none acute  Obese, passive  Skin- rash-none, lesions- none, excoriation- none Lymphadenopathy- none Head- atraumatic            Eyes- Gross vision intact, PERRLA, conjunctivae clear secretions  Ears- Hearing, canals normal            Nose- Clear, No- Septal dev, mucus, polyps, erosion, perforation             Throat- Mallampati III , mucosa clear , drainage- none, tonsils- atrophic Neck- flexible , trachea midline, no stridor , thyroid nl, carotid no bruits Chest - symmetrical excursion , unlabored           Heart/CV- RRR , no murmur , no gallop  , no rub, nl s1 s2                           - JVD- none , edema- none, stasis changes- none, varices- none           Lung- clear to P&A, wheeze- none, cough- none , dullness-none, rub- none           Chest wall-  Abd-  Br/ Gen/ Rectal- Not done, not indicated Extrem- cyanosis- none, clubbing, none, atrophy- none, strength- nl,  +walker Neuro- + flat facial expression    Assessment & Plan:

## 2013-05-18 NOTE — Patient Instructions (Signed)
We can leave your CPAP at 17/ Advanced, since that is comfortable and is working well for you  Please call as needed

## 2013-05-31 NOTE — Assessment & Plan Note (Signed)
We agreed to leave him at CPAP 17 for now. He will ask his DME company how to shorten his ramp

## 2013-07-23 ENCOUNTER — Ambulatory Visit: Payer: PRIVATE HEALTH INSURANCE | Admitting: Neurology

## 2013-07-27 ENCOUNTER — Encounter: Payer: Self-pay | Admitting: Neurology

## 2013-07-27 ENCOUNTER — Ambulatory Visit (INDEPENDENT_AMBULATORY_CARE_PROVIDER_SITE_OTHER): Payer: PRIVATE HEALTH INSURANCE | Admitting: Neurology

## 2013-07-27 VITALS — BP 197/88 | HR 81 | Ht 66.25 in | Wt 249.0 lb

## 2013-07-27 DIAGNOSIS — G20 Parkinson's disease: Secondary | ICD-10-CM

## 2013-07-27 MED ORDER — CARBIDOPA-LEVODOPA 25-100 MG PO TABS: 1.0000 | ORAL_TABLET | Freq: Three times a day (TID) | ORAL | Status: DC

## 2013-07-27 NOTE — Patient Instructions (Signed)
Overall you are doing fairly well but I do want to suggest a few things today:   Remember to drink plenty of fluid, eat healthy meals and do not skip any meals. Try to eat protein with a every meal and eat a healthy snack such as fruit or nuts in between meals. Try to keep a regular sleep-wake schedule and try to exercise daily, particularly in the form of walking, 20-30 minutes a day, if you can.   As far as your medications are concerned, I would like to suggest the following: 1)Increase your Sinemet to 1 whole tablet three times a day  We will place a referral for Home PT We will also place a referral for you to be evaluated by a speech therapist to check your swallowing  I would like to see you back in 3 to 4 months, sooner if we need to. Please call us with any interim questions, concerns, problems, updates or refill requests.   Please also call us for any test results so we can go over those with you on the phone.  My clinical assistant and will answer any of your questions and relay your messages to me and also relay most of my messages to you.   Our phone number is [phone]. We also have an after hours call service for urgent matters and there is a physician on-call for urgent questions. For any emergencies you know to call 911 or go to the nearest emergency room
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
## 2013-08-02 ENCOUNTER — Other Ambulatory Visit: Payer: Self-pay

## 2013-08-02 MED ORDER — CARBIDOPA-LEVODOPA 25-100 MG PO TABS: 1.0000 | ORAL_TABLET | Freq: Three times a day (TID) | ORAL | Status: DC

## 2013-08-02 NOTE — Telephone Encounter (Signed)
Pharmacy sent [place] a fax asking tat we resend Sinemet Rx.   Last OV says: As far as your medications are concerned, I would like to suggest the following:  1)Increase your Sinemet to 1 whole tablet three times a day

## 2013-08-27 ENCOUNTER — Telehealth: Payer: Self-pay | Admitting: Neurology

## 2013-08-27 ENCOUNTER — Other Ambulatory Visit: Payer: Self-pay | Admitting: Neurology

## 2013-08-27 MED ORDER — CARBIDOPA-LEVODOPA 25-100 MG PO TABS: 1.5000 | ORAL_TABLET | Freq: Three times a day (TID) | ORAL | Status: DC

## 2013-08-27 NOTE — Telephone Encounter (Signed)
Discussed patients concerns of no improvement with Sinemet 25-100 TID. Will increase to 1.5 tablets TID. Will check into status of home PT and SLT therapy.

## 2013-08-27 NOTE — Telephone Encounter (Signed)
Taking Carbo-Levo 25/100mg  3 a day--balance is no better, stays sleepy, blood sugar went up but is back down now--please call

## 2013-09-22 ENCOUNTER — Inpatient Hospital Stay (HOSPITAL_COMMUNITY)
Admission: EM | Admit: 2013-09-22 | Discharge: 2013-09-27 | DRG: 554 | Disposition: A | Discharge status: Home Health | Payer: Medicare Other | Attending: Internal Medicine | Admitting: Internal Medicine

## 2013-09-22 ENCOUNTER — Encounter (HOSPITAL_COMMUNITY): Payer: Self-pay | Admitting: Emergency Medicine

## 2013-09-22 ENCOUNTER — Emergency Department (HOSPITAL_COMMUNITY): Payer: Medicare Other

## 2013-09-22 DIAGNOSIS — Z87891 Personal history of nicotine dependence: Secondary | ICD-10-CM

## 2013-09-22 DIAGNOSIS — J189 Pneumonia, unspecified organism: Secondary | ICD-10-CM

## 2013-09-22 DIAGNOSIS — J209 Acute bronchitis, unspecified: Secondary | ICD-10-CM

## 2013-09-22 DIAGNOSIS — R509 Fever, unspecified: Secondary | ICD-10-CM

## 2013-09-22 DIAGNOSIS — E119 Type 2 diabetes mellitus without complications: Secondary | ICD-10-CM

## 2013-09-22 DIAGNOSIS — J9690 Respiratory failure, unspecified, unspecified whether with hypoxia or hypercapnia: Secondary | ICD-10-CM

## 2013-09-22 DIAGNOSIS — S065X9A Traumatic subdural hemorrhage with loss of consciousness of unspecified duration, initial encounter: Secondary | ICD-10-CM

## 2013-09-22 DIAGNOSIS — E039 Hypothyroidism, unspecified: Secondary | ICD-10-CM | POA: Diagnosis present

## 2013-09-22 DIAGNOSIS — I5030 Unspecified diastolic (congestive) heart failure: Secondary | ICD-10-CM | POA: Diagnosis not present

## 2013-09-22 DIAGNOSIS — I509 Heart failure, unspecified: Secondary | ICD-10-CM | POA: Diagnosis not present

## 2013-09-22 DIAGNOSIS — M67919 Unspecified disorder of synovium and tendon, unspecified shoulder: Secondary | ICD-10-CM | POA: Diagnosis present

## 2013-09-22 DIAGNOSIS — G20 Parkinson's disease: Secondary | ICD-10-CM

## 2013-09-22 DIAGNOSIS — R1310 Dysphagia, unspecified: Secondary | ICD-10-CM

## 2013-09-22 DIAGNOSIS — Z66 Do not resuscitate: Secondary | ICD-10-CM | POA: Diagnosis not present

## 2013-09-22 DIAGNOSIS — K8020 Calculus of gallbladder without cholecystitis without obstruction: Secondary | ICD-10-CM

## 2013-09-22 DIAGNOSIS — M75121 Complete rotator cuff tear or rupture of right shoulder, not specified as traumatic: Secondary | ICD-10-CM

## 2013-09-22 DIAGNOSIS — Z85828 Personal history of other malignant neoplasm of skin: Secondary | ICD-10-CM

## 2013-09-22 DIAGNOSIS — M25412 Effusion, left shoulder: Secondary | ICD-10-CM

## 2013-09-22 DIAGNOSIS — R339 Retention of urine, unspecified: Secondary | ICD-10-CM | POA: Diagnosis not present

## 2013-09-22 DIAGNOSIS — E669 Obesity, unspecified: Secondary | ICD-10-CM | POA: Diagnosis present

## 2013-09-22 DIAGNOSIS — Z79899 Other long term (current) drug therapy: Secondary | ICD-10-CM

## 2013-09-22 DIAGNOSIS — M109 Gout, unspecified: Principal | ICD-10-CM

## 2013-09-22 DIAGNOSIS — S065XAA Traumatic subdural hemorrhage with loss of consciousness status unknown, initial encounter: Secondary | ICD-10-CM

## 2013-09-22 DIAGNOSIS — E1142 Type 2 diabetes mellitus with diabetic polyneuropathy: Secondary | ICD-10-CM

## 2013-09-22 DIAGNOSIS — I10 Essential (primary) hypertension: Secondary | ICD-10-CM

## 2013-09-22 DIAGNOSIS — R269 Unspecified abnormalities of gait and mobility: Secondary | ICD-10-CM

## 2013-09-22 DIAGNOSIS — A419 Sepsis, unspecified organism: Secondary | ICD-10-CM

## 2013-09-22 DIAGNOSIS — J309 Allergic rhinitis, unspecified: Secondary | ICD-10-CM

## 2013-09-22 DIAGNOSIS — E1149 Type 2 diabetes mellitus with other diabetic neurological complication: Secondary | ICD-10-CM

## 2013-09-22 DIAGNOSIS — M719 Bursopathy, unspecified: Secondary | ICD-10-CM | POA: Diagnosis present

## 2013-09-22 DIAGNOSIS — Z96659 Presence of unspecified artificial knee joint: Secondary | ICD-10-CM

## 2013-09-22 DIAGNOSIS — Z7982 Long term (current) use of aspirin: Secondary | ICD-10-CM

## 2013-09-22 DIAGNOSIS — G20A1 Parkinson's disease without dyskinesia, without mention of fluctuations: Secondary | ICD-10-CM

## 2013-09-22 DIAGNOSIS — G4733 Obstructive sleep apnea (adult) (pediatric): Secondary | ICD-10-CM

## 2013-09-22 DIAGNOSIS — W19XXXA Unspecified fall, initial encounter: Secondary | ICD-10-CM

## 2013-09-22 HISTORY — DX: Hypothyroidism, unspecified: E03.9

## 2013-09-22 HISTORY — DX: Personal history of other diseases of the digestive system: Z87.19

## 2013-09-22 LAB — I-STAT ARTERIAL BLOOD GAS, ED
Acid-Base Excess: 3 mmol/L — ABNORMAL HIGH (ref 0.0–2.0)
Bicarbonate: 26.3 mEq/L — ABNORMAL HIGH (ref 20.0–24.0)
O2 Saturation: 97 %
TCO2: 27 mmol/L (ref 0–100)
pCO2 arterial: 37.6 mmHg (ref 35.0–45.0)
pH, Arterial: 7.46 — ABNORMAL HIGH (ref 7.350–7.450)
pO2, Arterial: 94 mmHg (ref 80.0–100.0)

## 2013-09-22 LAB — PROCALCITONIN: PROCALCITONIN: 0.15 ng/mL

## 2013-09-22 LAB — CBC WITH DIFFERENTIAL/PLATELET
BASOS ABS: 0 10*3/uL (ref 0.0–0.1)
BASOS PCT: 0 % (ref 0–1)
Eosinophils Absolute: 0 10*3/uL (ref 0.0–0.7)
Eosinophils Relative: 0 % (ref 0–5)
HCT: 40.4 % (ref 39.0–52.0)
Hemoglobin: 13.8 g/dL (ref 13.0–17.0)
Lymphocytes Relative: 21 % (ref 12–46)
Lymphs Abs: 3.1 10*3/uL (ref 0.7–4.0)
MCH: 28 pg (ref 26.0–34.0)
MCHC: 34.2 g/dL (ref 30.0–36.0)
MCV: 81.9 fL (ref 78.0–100.0)
MONO ABS: 1.4 10*3/uL — AB (ref 0.1–1.0)
Monocytes Relative: 10 % (ref 3–12)
NEUTROS ABS: 10.5 10*3/uL — AB (ref 1.7–7.7)
NRBC: 0 /100{WBCs}
Neutrophils Relative %: 70 % (ref 43–77)
PLATELETS: 254 10*3/uL (ref 150–400)
RBC: 4.93 MIL/uL (ref 4.22–5.81)
RDW: 15.3 % (ref 11.5–15.5)
WBC: 15 10*3/uL — ABNORMAL HIGH (ref 4.0–10.5)

## 2013-09-22 LAB — I-STAT TROPONIN, ED: Troponin i, poc: 0.01 ng/mL (ref 0.00–0.08)

## 2013-09-22 LAB — COMPREHENSIVE METABOLIC PANEL
ALT: 8 U/L (ref 0–53)
AST: 29 U/L (ref 0–37)
Albumin: 3.3 g/dL — ABNORMAL LOW (ref 3.5–5.2)
Alkaline Phosphatase: 94 U/L (ref 39–117)
BUN: 15 mg/dL (ref 6–23)
CO2: 21 mEq/L (ref 19–32)
Calcium: 8.8 mg/dL (ref 8.4–10.5)
Chloride: 98 mEq/L (ref 96–112)
Creatinine, Ser: 1.08 mg/dL (ref 0.50–1.35)
GFR calc Af Amer: 74 mL/min — ABNORMAL LOW (ref >90)
GFR calc non Af Amer: 64 mL/min — ABNORMAL LOW (ref >90)
Glucose, Bld: 237 mg/dL — ABNORMAL HIGH (ref 70–99)
POTASSIUM: 3.4 meq/L — AB (ref 3.7–5.3)
Sodium: 138 mEq/L (ref 137–147)
TOTAL PROTEIN: 7.4 g/dL (ref 6.0–8.3)
Total Bilirubin: 1 mg/dL (ref 0.3–1.2)

## 2013-09-22 LAB — PROTIME-INR
INR: 1.02 (ref 0.00–1.49)
PROTHROMBIN TIME: 13.2 s (ref 11.6–15.2)

## 2013-09-22 LAB — PRO B NATRIURETIC PEPTIDE: Pro B Natriuretic peptide (BNP): 220.8 pg/mL (ref 0–450)

## 2013-09-22 LAB — I-STAT CG4 LACTIC ACID, ED: LACTIC ACID, VENOUS: 1.93 mmol/L (ref 0.5–2.2)

## 2013-09-22 MED ORDER — VANCOMYCIN HCL IN DEXTROSE 1-5 GM/200ML-% IV SOLN
1000.0000 mg | Freq: Two times a day (BID) | INTRAVENOUS | Status: DC
  Administered 2013-09-23 – 2013-09-26 (×7): 1000 mg via INTRAVENOUS
  Filled 2013-09-22 (×8): qty 200

## 2013-09-22 MED ORDER — SODIUM CHLORIDE 0.9 % IV SOLN
1000.0000 mL | Freq: Once | INTRAVENOUS | Status: AC
  Administered 2013-09-22: 1000 mL via INTRAVENOUS

## 2013-09-22 MED ORDER — DEXTROSE 5 % IV SOLN
1.0000 g | Freq: Three times a day (TID) | INTRAVENOUS | Status: DC
  Administered 2013-09-23 – 2013-09-26 (×10): 1 g via INTRAVENOUS
  Filled 2013-09-22 (×12): qty 1

## 2013-09-22 MED ORDER — VANCOMYCIN HCL IN DEXTROSE 1-5 GM/200ML-% IV SOLN: 1000.0000 mg | Freq: Once | INTRAVENOUS | Status: DC

## 2013-09-22 MED ORDER — DEXTROSE 5 % IV SOLN
2.0000 g | Freq: Once | INTRAVENOUS | Status: AC
  Administered 2013-09-22: 2 g via INTRAVENOUS

## 2013-09-22 MED ORDER — ALBUTEROL SULFATE (2.5 MG/3ML) 0.083% IN NEBU: 2.5000 mg | INHALATION_SOLUTION | RESPIRATORY_TRACT | Status: AC | PRN

## 2013-09-22 MED ORDER — SODIUM CHLORIDE 0.9 % IV SOLN: INTRAVENOUS | Status: DC

## 2013-09-22 MED ORDER — SODIUM CHLORIDE 0.9 % IV SOLN
1000.0000 mL | INTRAVENOUS | Status: DC
  Administered 2013-09-22: 1000 mL via INTRAVENOUS

## 2013-09-22 MED ORDER — VANCOMYCIN HCL 10 G IV SOLR
2000.0000 mg | Freq: Once | INTRAVENOUS | Status: AC
  Administered 2013-09-22: 2000 mg via INTRAVENOUS
  Filled 2013-09-22: qty 2000

## 2013-09-22 NOTE — Progress Notes (Signed)
ANTIBIOTIC CONSULT NOTE - INITIAL  Pharmacy Consult for Cefepime and Vancomycin Indication: pneumonia  Allergies  Allergen Reactions  . Hydrocodone Other (See Comments)    Lethargic per wife    Patient Measurements: Height: 5' 6.14" (168 cm) Weight: 242 lb (109.77 kg) IBW/kg (Calculated) : 64.13 Adjusted Body Weight:   Vital Signs: Temp: 102.5 F (39.2 C) (03/04 2053) Temp src: Rectal (03/04 2053) BP: 126/33 mmHg (03/04 2251) Pulse Rate: 85 (03/04 2251) Intake/Output from previous day:   Intake/Output from this shift:    Labs:  Recent Labs  09/22/13 2104  WBC 15.0*  HGB 13.8  PLT 254  CREATININE 1.08   Estimated Creatinine Clearance: 65.7 ml/min (by C-G formula based on Cr of 1.08). No results found for this basename: VANCOTROUGH, VANCOPEAK, VANCORANDOM, GENTTROUGH, GENTPEAK, GENTRANDOM, TOBRATROUGH, TOBRAPEAK, TOBRARND, AMIKACINPEAK, AMIKACINTROU, AMIKACIN,  in the last 72 hours   Microbiology: No results found for this or any previous visit (from the past 720 hour(s)).  Medical History: Past Medical History  Diagnosis Date  . 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 Type II or unspecified type diabetes mellitus without mention of complication, not stated as uncontrolled   . Allergic rhinitis, cause unspecified   . Unspecified essential hypertension   . Obstructive sleep apnea (adult) (pediatric)     NPSG 09-15-03 AHI 84.5, CPAP 18/ AHI 0  . Neuropathy   . Carpal tunnel syndrome on both sides   . Obesity   . Gallstone     no need for surgery per Dr. Deon Pilling  . A-fib   . Paralysis agitans 04/22/2013  . Abnormality of gait 04/22/2013  . Polyneuropathy in diabetes(357.2) 04/22/2013  . Skin cancer   . Esophageal stricture     Requiring dilation    Medications:  Scheduled:   Assessment: 80 yr old male presented to the ED with increasing SOB, decreased mouth intake and decreased urine  output. He was hypoxic (80%) on room air. Pharmacy has been asked to dose cefepime and vancomycin for HCAP.  Goal of Therapy:  Vancomycin trough level 15-20 mcg/ml  Plan:  Vancomycin 2 GM initially, then 1 Gm every 12 hrs. Vanc levels when appropriate. Cefepime 2 Gm initially, then 1 Gm every 8 hrs.  Minta Balsam 09/22/2013,10:53 PM

## 2013-09-22 NOTE — ED Notes (Signed)
Per EMS Pt came from home c/o increasing lethargy x several weeks that has increased today. Pt also has had decreased urine output. Pt has hx of progressive secular palsy. Pt tachypenic with EMS RR 26. EMS gave 2.5 mg albuterol en route. ems also gave 800 mg oral ibuprophen.
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
## 2013-09-23 ENCOUNTER — Inpatient Hospital Stay (HOSPITAL_COMMUNITY): Payer: Medicare Other

## 2013-09-23 DIAGNOSIS — A419 Sepsis, unspecified organism: Secondary | ICD-10-CM

## 2013-09-23 DIAGNOSIS — E1142 Type 2 diabetes mellitus with diabetic polyneuropathy: Secondary | ICD-10-CM

## 2013-09-23 DIAGNOSIS — E1149 Type 2 diabetes mellitus with other diabetic neurological complication: Secondary | ICD-10-CM

## 2013-09-23 DIAGNOSIS — R1310 Dysphagia, unspecified: Secondary | ICD-10-CM | POA: Diagnosis present

## 2013-09-23 DIAGNOSIS — G20 Parkinson's disease: Secondary | ICD-10-CM

## 2013-09-23 LAB — GLUCOSE, CAPILLARY
Glucose-Capillary: 180 mg/dL — ABNORMAL HIGH (ref 70–99)
Glucose-Capillary: 154 mg/dL — ABNORMAL HIGH (ref 70–99)
Glucose-Capillary: 168 mg/dL — ABNORMAL HIGH (ref 70–99)
Glucose-Capillary: 135 mg/dL — ABNORMAL HIGH (ref 70–99)

## 2013-09-23 LAB — BASIC METABOLIC PANEL
BUN: 10 mg/dL (ref 6–23)
BUN: 11 mg/dL (ref 6–23)
CALCIUM: 8.2 mg/dL — AB (ref 8.4–10.5)
CHLORIDE: 99 meq/L (ref 96–112)
CO2: 24 mEq/L (ref 19–32)
CO2: 23 mEq/L (ref 19–32)
Calcium: 8.5 mg/dL (ref 8.4–10.5)
Chloride: 104 mEq/L (ref 96–112)
Creatinine, Ser: 0.89 mg/dL (ref 0.50–1.35)
Creatinine, Ser: 0.98 mg/dL (ref 0.50–1.35)
GFR calc Af Amer: >90 mL/min (ref >90)
GFR calc non Af Amer: 77 mL/min — ABNORMAL LOW (ref >90)
GFR, EST AFRICAN AMERICAN: 89 mL/min — AB (ref >90)
GFR, EST NON AFRICAN AMERICAN: 80 mL/min — AB (ref >90)
GLUCOSE: 200 mg/dL — AB (ref 70–99)
Glucose, Bld: 181 mg/dL — ABNORMAL HIGH (ref 70–99)
POTASSIUM: 3.4 meq/L — AB (ref 3.7–5.3)
Potassium: 3.3 mEq/L — ABNORMAL LOW (ref 3.7–5.3)
Sodium: 140 mEq/L (ref 137–147)
Sodium: 137 mEq/L (ref 137–147)

## 2013-09-23 LAB — URINALYSIS, ROUTINE W REFLEX MICROSCOPIC
Bilirubin Urine: NEGATIVE
Glucose, UA: 500 mg/dL — AB
Hgb urine dipstick: NEGATIVE
Ketones, ur: NEGATIVE mg/dL
LEUKOCYTES UA: NEGATIVE
Nitrite: NEGATIVE
Protein, ur: 100 mg/dL — AB
Specific Gravity, Urine: 1.022 (ref 1.005–1.030)
Urobilinogen, UA: 1 mg/dL (ref 0.0–1.0)
pH: 6.5 (ref 5.0–8.0)

## 2013-09-23 LAB — URINE MICROSCOPIC-ADD ON

## 2013-09-23 LAB — CBC
HEMATOCRIT: 37 % — AB (ref 39.0–52.0)
HEMOGLOBIN: 12.6 g/dL — AB (ref 13.0–17.0)
MCH: 28.1 pg (ref 26.0–34.0)
MCHC: 34.1 g/dL (ref 30.0–36.0)
MCV: 82.4 fL (ref 78.0–100.0)
Platelets: 224 10*3/uL (ref 150–400)
RBC: 4.49 MIL/uL (ref 4.22–5.81)
RDW: 15.4 % (ref 11.5–15.5)
WBC: 11 10*3/uL — ABNORMAL HIGH (ref 4.0–10.5)

## 2013-09-23 LAB — INFLUENZA PANEL BY PCR (TYPE A & B)
H1N1 flu by pcr: NOT DETECTED
INFLBPCR: NEGATIVE
Influenza A By PCR: NEGATIVE

## 2013-09-23 LAB — PRO B NATRIURETIC PEPTIDE: Pro B Natriuretic peptide (BNP): 376 pg/mL (ref 0–450)

## 2013-09-23 MED ORDER — ACETAMINOPHEN 325 MG PO TABS
650.0000 mg | ORAL_TABLET | Freq: Four times a day (QID) | ORAL | Status: DC | PRN
  Administered 2013-09-23 – 2013-09-25 (×4): 650 mg via ORAL
  Filled 2013-09-23 (×4): qty 2

## 2013-09-23 MED ORDER — INSULIN ASPART 100 UNIT/ML ~~LOC~~ SOLN
0.0000 [IU] | Freq: Three times a day (TID) | SUBCUTANEOUS | Status: DC
Start: 2013-09-23 — End: 2013-09-27
  Administered 2013-09-23: 1 [IU] via SUBCUTANEOUS
  Administered 2013-09-23: 2 [IU] via SUBCUTANEOUS
  Administered 2013-09-24 (×2): 1 [IU] via SUBCUTANEOUS
  Administered 2013-09-24: 2 [IU] via SUBCUTANEOUS
  Administered 2013-09-25 – 2013-09-26 (×2): 1 [IU] via SUBCUTANEOUS
  Administered 2013-09-26: 2 [IU] via SUBCUTANEOUS
  Administered 2013-09-26: 1 [IU] via SUBCUTANEOUS
  Administered 2013-09-27: 5 [IU] via SUBCUTANEOUS
  Administered 2013-09-27: 3 [IU] via SUBCUTANEOUS

## 2013-09-23 MED ORDER — SODIUM CHLORIDE 0.9 % IV SOLN
INTRAVENOUS | Status: DC
  Administered 2013-09-23: 03:00:00 via INTRAVENOUS

## 2013-09-23 MED ORDER — LEVOTHYROXINE SODIUM 75 MCG PO TABS
75.0000 ug | ORAL_TABLET | Freq: Every day | ORAL | Status: DC
  Administered 2013-09-24 – 2013-09-27 (×4): 75 ug via ORAL
  Filled 2013-09-23 (×5): qty 1

## 2013-09-23 MED ORDER — ASPIRIN EC 81 MG PO TBEC
81.0000 mg | DELAYED_RELEASE_TABLET | Freq: Every day | ORAL | Status: DC
  Administered 2013-09-23 – 2013-09-27 (×5): 81 mg via ORAL
  Filled 2013-09-23 (×5): qty 1

## 2013-09-23 MED ORDER — MONTELUKAST SODIUM 10 MG PO TABS
10.0000 mg | ORAL_TABLET | Freq: Every day | ORAL | Status: DC
  Administered 2013-09-23 – 2013-09-26 (×4): 10 mg via ORAL
  Filled 2013-09-23 (×5): qty 1

## 2013-09-23 MED ORDER — IBUPROFEN 400 MG PO TABS
400.0000 mg | ORAL_TABLET | Freq: Once | ORAL | Status: AC
  Administered 2013-09-23: 400 mg via ORAL
  Filled 2013-09-23: qty 1

## 2013-09-23 MED ORDER — HYDRALAZINE HCL 25 MG PO TABS
25.0000 mg | ORAL_TABLET | Freq: Four times a day (QID) | ORAL | Status: DC | PRN
  Administered 2013-09-23 – 2013-09-25 (×4): 25 mg via ORAL
  Filled 2013-09-23 (×5): qty 1

## 2013-09-23 MED ORDER — CARBIDOPA-LEVODOPA 25-100 MG PO TABS
0.5000 | ORAL_TABLET | Freq: Three times a day (TID) | ORAL | Status: DC
  Administered 2013-09-23 – 2013-09-27 (×13): 0.5 via ORAL
  Filled 2013-09-23 (×15): qty 0.5

## 2013-09-23 MED ORDER — SIMVASTATIN 20 MG PO TABS
20.0000 mg | ORAL_TABLET | Freq: Every evening | ORAL | Status: DC
  Administered 2013-09-23 – 2013-09-26 (×4): 20 mg via ORAL
  Filled 2013-09-23 (×5): qty 1

## 2013-09-23 MED ORDER — ONDANSETRON HCL 4 MG/2ML IJ SOLN: 4.0000 mg | Freq: Four times a day (QID) | INTRAMUSCULAR | Status: DC | PRN

## 2013-09-23 MED ORDER — FUROSEMIDE 10 MG/ML IJ SOLN
20.0000 mg | Freq: Once | INTRAMUSCULAR | Status: AC
  Administered 2013-09-23: 20 mg via INTRAVENOUS

## 2013-09-23 MED ORDER — ENOXAPARIN SODIUM 40 MG/0.4ML ~~LOC~~ SOLN
40.0000 mg | SUBCUTANEOUS | Status: DC
Start: 2013-09-23 — End: 2013-09-23

## 2013-09-23 MED ORDER — FUROSEMIDE 10 MG/ML IJ SOLN
INTRAMUSCULAR | Status: AC
  Filled 2013-09-23: qty 4

## 2013-09-23 MED ORDER — PANTOPRAZOLE SODIUM 40 MG PO TBEC
40.0000 mg | DELAYED_RELEASE_TABLET | Freq: Every day | ORAL | Status: DC
  Administered 2013-09-23 – 2013-09-27 (×5): 40 mg via ORAL
  Filled 2013-09-23 (×4): qty 1

## 2013-09-23 MED ORDER — ENOXAPARIN SODIUM 40 MG/0.4ML ~~LOC~~ SOLN
40.0000 mg | SUBCUTANEOUS | Status: DC
  Administered 2013-09-23: 40 mg via SUBCUTANEOUS
  Filled 2013-09-23 (×2): qty 0.4

## 2013-09-23 MED ORDER — LORATADINE 10 MG PO TABS
10.0000 mg | ORAL_TABLET | Freq: Every day | ORAL | Status: DC | PRN
  Filled 2013-09-23: qty 1

## 2013-09-23 MED ORDER — CARBIDOPA-LEVODOPA 25-100 MG PO TABS: 1.5000 | ORAL_TABLET | Freq: Three times a day (TID) | ORAL | Status: DC

## 2013-09-23 MED ORDER — AMLODIPINE BESYLATE 10 MG PO TABS
10.0000 mg | ORAL_TABLET | Freq: Every day | ORAL | Status: DC
  Administered 2013-09-23 – 2013-09-27 (×5): 10 mg via ORAL
  Filled 2013-09-23 (×5): qty 1

## 2013-09-23 MED ORDER — IBUPROFEN 400 MG PO TABS
400.0000 mg | ORAL_TABLET | Freq: Four times a day (QID) | ORAL | Status: DC | PRN
  Filled 2013-09-23: qty 1
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
## 2013-09-23 NOTE — ED Notes (Signed)
MD at bedside. 

## 2013-09-23 NOTE — Progress Notes (Signed)
UR Completed Tamar Miano Graves-Bigelow, RN,BSN [phone]  
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
## 2013-09-23 NOTE — Progress Notes (Signed)
RT placed patient on nasal CPAP. Auto mode of 20/5 with 2L bled in. Patient tolerating well at this time. Patient instructed to call nurse if he needs help with anything else and they can contact respiratory. RT will continue to monitor.
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
## 2013-09-23 NOTE — Progress Notes (Signed)
[facility] progress note. Chief complaint. Dyspnea. History of present illness. This 80 year old male in hospital with complaints of weakness. He was noted to be that tachycardic and tachypnea to was elevated temperature 102.5 and white blood cell elevation at 15,000. It should change the patient apparently demonstrated some increase work of breathing. Orders were left for portable chest x-ray she was obtained and indicated no acute cardiopulmonary disease for this low inspiratory portable examination. Patient had been receiving IV fluids as well as IV antibiotics and there was some concern about possible congestive heart failure. Patient was administered Lasix 20 mg IV with significant urine output greater than 700 cc. I came to see the patient at bedside to ensure that he remained clinically stable. In no complaints of chest pain or dyspnea. Vital signs. Temperature 102.3, pulse 96, respiration 23, blood pressure 218/76. O2 sats 97%. General appearance. Frail appearing elderly male who is alert and in no distress. Cardiac. Rate and rhythm regular. No jugular venous distention. Trace pedal edema. No sacral edema. Lungs. Breath sounds are clear but reduced in the midlung to base bilaterally likely secondary to poor inspiratory effort and obesity. There's no distress and O2 sats are stable. Patient able to vocalize in full sentences without dyspnea. Abdomen. Soft with positive bowel sounds. No pain with palpation. Patient is obese. Impression/plan. Problem 1. Probable CHF. Patient appears to have diuresis nicely. No indication of crackles or jugular venous distention per physical exam. Chest x-ray is clear. Given fever suspect possible viral respiratory infection. Patient continues on broad spectrum antibiotics. Nursing will follow closely and update me as needed.

## 2013-09-23 NOTE — Progress Notes (Addendum)
Subjective: Patient admitted this morning with weakness, cough and fever. Started on Vancomycin and cefepime empirically for sepsis. He is alert , oriented x 3. Wife says that he has been coughing for past few weeks. CXR is negative for consolidation. Dose of sinemet was increased two weeks ago.  Filed Vitals:   09/23/13 0740  BP: 195/84  Pulse: 68  Temp: 98.1 F (36.7 C)  Resp: 20    Chest: Clear Bilaterally Heart : S1S2 RRR Abdomen: Soft, nontender Ext : No edema Neuro: Alert, oriented x 3  A/P Acute bronchitis- influenza OCR is negative, will repeat CXR in am to r/o pneumonia. Parkinson's disease- Dose of Sinemet was increased recently by neurologist, which could have caused the weakness. Will cause neurology for meds adjustment. Dysphagia- continue with Dys 2 diet Diabetes mellitus- Continue with SSI Hypertension- Continue with Amlodipine, start Hydralazine prn.   [facility] [phone]

## 2013-09-23 NOTE — Progress Notes (Signed)
Fever beginning to abate with tylenol and ibuprofen as ordered. Last temp 100.1* F, orally. Patient states "feeling better." Notification sent to Kathline Magic, NP on-call for [facility] to request PRN dosing schedule for ibuprofen (was one-time order).  Also spoke to Dr. Leonel Ramsay, neurology, re: home dose of sinemet. Patient has been taking sinemet 25/100mg  1.5 tabs PO TID, recently increased as outpatient his neurologist. Dr. Nicole Kindred, neurology, had noted earlier today that he would recommend continuing this home dose. At this point, dose has been reduced to 0.5 tab PO TID by Dr. Darrick Meigs, patient's attending hospitalist. Noted here for continuity of care.  Continuing to monitor.

## 2013-09-24 ENCOUNTER — Inpatient Hospital Stay (HOSPITAL_COMMUNITY): Payer: Medicare Other

## 2013-09-24 ENCOUNTER — Encounter (HOSPITAL_COMMUNITY): Payer: Self-pay | Admitting: General Practice

## 2013-09-24 DIAGNOSIS — J9600 Acute respiratory failure, unspecified whether with hypoxia or hypercapnia: Secondary | ICD-10-CM

## 2013-09-24 DIAGNOSIS — I10 Essential (primary) hypertension: Secondary | ICD-10-CM

## 2013-09-24 DIAGNOSIS — E119 Type 2 diabetes mellitus without complications: Secondary | ICD-10-CM

## 2013-09-24 DIAGNOSIS — I059 Rheumatic mitral valve disease, unspecified: Secondary | ICD-10-CM

## 2013-09-24 LAB — URINE CULTURE
Colony Count: NO GROWTH
Culture: NO GROWTH

## 2013-09-24 LAB — GLUCOSE, CAPILLARY
GLUCOSE-CAPILLARY: 180 mg/dL — AB (ref 70–99)
GLUCOSE-CAPILLARY: 136 mg/dL — AB (ref 70–99)
GLUCOSE-CAPILLARY: 157 mg/dL — AB (ref 70–99)
GLUCOSE-CAPILLARY: 147 mg/dL — AB (ref 70–99)
Glucose-Capillary: 142 mg/dL — ABNORMAL HIGH (ref 70–99)

## 2013-09-24 MED ORDER — GADOBENATE DIMEGLUMINE 529 MG/ML IV SOLN
20.0000 mL | Freq: Once | INTRAVENOUS | Status: AC | PRN
  Administered 2013-09-24: 20 mL via INTRAVENOUS

## 2013-09-24 MED ORDER — FUROSEMIDE 20 MG PO TABS
20.0000 mg | ORAL_TABLET | Freq: Every day | ORAL | Status: DC
  Administered 2013-09-24 – 2013-09-27 (×4): 20 mg via ORAL
  Filled 2013-09-24 (×4): qty 1

## 2013-09-24 MED ORDER — ENOXAPARIN SODIUM 60 MG/0.6ML ~~LOC~~ SOLN
50.0000 mg | SUBCUTANEOUS | Status: DC
  Administered 2013-09-24 – 2013-09-25 (×2): 50 mg via SUBCUTANEOUS
  Filled 2013-09-24 (×2): qty 0.6

## 2013-09-24 MED ORDER — POTASSIUM CHLORIDE CRYS ER 20 MEQ PO TBCR
40.0000 meq | EXTENDED_RELEASE_TABLET | ORAL | Status: AC
  Administered 2013-09-24 (×2): 40 meq via ORAL
  Filled 2013-09-24 (×2): qty 2

## 2013-09-24 NOTE — Progress Notes (Signed)
Echocardiogram 2D Echocardiogram has been performed.  Joshua Glass 09/24/2013, 2:30 PM

## 2013-09-24 NOTE — Progress Notes (Signed)
[facility] PROGRESS NOTE  Joshua Glass [id number]:[id number] DOB: 1932-10-18 DOA: 09/22/2013 PCP: Myriam Jacobson, MD  Assessment/Plan: ? Acute bronchitis- influenza PCR is negative, repeat CXR shows no pneumonia. Pulmonary edema- Last night developed pulmonary edema, and required IV lasix x 1, IV fluids have been discontinued. BNP in 200's, will check 2d echo and continue with low dose lasix 20 mg po daily. Left shoulder pain/? Abscess- Will obtain MRI of left shoulder to r/o underlying infectious process, like abscess. Parkinson's disease- Dose of Sinemet was increased recently by neurologist, which could have caused the weakness. Discussed with neuro today, and Etta Quill PA has seen the patient and recommend to keep the patient on Sinemet 0.5 tab po TID, and d/c home at this dose . Can follow up with neuro as outpatient. Dysphagia- continue with Dys 2 diet  Diabetes mellitus- Continue with SSI  Hypertension- Continue with Amlodipine, start Hydralazine prn.    1.   Code Status: DNR Family Communication: *Discussed with wife at bedside Disposition Plan: *Home when stable   Consultants:  Neurology   Procedures:  None  Antibiotics:  Vancomycin 3/5  Cefepime 3/5  HPI/Subjective: 80 y.o. male with PMH of Gait disorder ( Parkinsonism vs Progressive supranuclear palsy), DM, HTN, Hypothyroidism, mild memory problems presents from home with the above complaints.  Pt is a very poor historian, his wife reports ongoing cough off and on for the past few months that worsened in the last few days, it is mostly nonproductive.  UA was clear, CXR showed no pneumonia, last night he became short of breath due to fluid overload and required IV lasix 20 mg x 1. IV fluids were discontinued. Today he is breathing better, still having fever, without obvious source of infection. He has pain in the left shoulder, and unable to move the left shoulder.   Objective: Filed Vitals:   09/24/13  1056  BP: 152/72  Pulse: 69  Temp:   Resp:     Intake/Output Summary (Last 24 hours) at 09/24/13 1225 Last data filed at 09/24/13 1041  Gross per 24 hour  Intake    540 ml  Output   2650 ml  Net  -2110 ml   Filed Weights   09/22/13 2100 09/23/13 0421  Weight: 109.77 kg (242 lb) 108.863 kg (240 lb)    Exam:  Physical Exam: Head: Normocephalic, atraumatic.  Eyes: No signs of jaundice, EOMI Nose: Mucous membranes dry.  Throat: Oropharynx nonerythematous, no exudate appreciated.  Neck: supple,No deformities, masses, or tenderness noted. Lungs: Normal respiratory effort. B/L Clear to auscultation, no crackles or wheezes.  Heart: Regular RR. S1 and S2 normal  Abdomen: BS normoactive. Soft, Nondistended, non-tender.  Extremities: No pretibial edema, no erythema  Data Reviewed: Basic Metabolic Panel:  Recent Labs Lab 09/22/13 2104 09/23/13 1100 09/23/13 2015  NA 138 140 137  K 3.4* 3.4* 3.3*  CL 98 104 99  CO2 21 24 23   GLUCOSE 237* 200* 181*  BUN 15 10 11   CREATININE 1.08 0.89 0.98  CALCIUM 8.8 8.2* 8.5   Liver Function Tests:  Recent Labs Lab 09/22/13 2104  AST 29  ALT 8  ALKPHOS 94  BILITOT 1.0  PROT 7.4  ALBUMIN 3.3*   No results found for this basename: LIPASE, AMYLASE,  in the last 168 hours No results found for this basename: AMMONIA,  in the last 168 hours CBC:  Recent Labs Lab 09/22/13 2104 09/23/13 1100  WBC 15.0* 11.0*  NEUTROABS 10.5*  --  HGB 13.8 12.6*  HCT 40.4 37.0*  MCV 81.9 82.4  PLT 254 224   Cardiac Enzymes: No results found for this basename: CKTOTAL, CKMB, CKMBINDEX, TROPONINI,  in the last 168 hours BNP (last 3 results)  Recent Labs  09/22/13 2104 09/23/13 2015  PROBNP 220.8 376.0   CBG:  Recent Labs Lab 09/23/13 1202 09/23/13 1625 09/23/13 2106 09/24/13 0808 09/24/13 1127  GLUCAP 168* 135* 180* 136* 157*    Recent Results (from the past 240 hour(s))  CULTURE, BLOOD (ROUTINE X 2)     Status: None    Collection Time    09/22/13  9:04 PM      Result Value Ref Range Status   Specimen Description BLOOD HAND RIGHT   Final   Special Requests BOTTLES DRAWN AEROBIC AND ANAEROBIC 10CC   Final   Culture  Setup Time     Final   Value: 09/23/2013 04:33     Performed at [facility]   Culture     Final   Value:        BLOOD CULTURE RECEIVED NO GROWTH TO DATE CULTURE WILL BE HELD FOR 5 DAYS BEFORE ISSUING A FINAL NEGATIVE REPORT     Performed at [facility]   Report Status PENDING   Incomplete  CULTURE, BLOOD (ROUTINE X 2)     Status: None   Collection Time    09/22/13  9:09 PM      Result Value Ref Range Status   Specimen Description BLOOD ARM RIGHT   Final   Special Requests BOTTLES DRAWN AEROBIC AND ANAEROBIC 10CC   Final   Culture  Setup Time     Final   Value: 09/23/2013 04:34     Performed at [facility]   Culture     Final   Value:        BLOOD CULTURE RECEIVED NO GROWTH TO DATE CULTURE WILL BE HELD FOR 5 DAYS BEFORE ISSUING A FINAL NEGATIVE REPORT     Performed at [facility]   Report Status PENDING   Incomplete  URINE CULTURE     Status: None   Collection Time    09/22/13 11:34 PM      Result Value Ref Range Status   Specimen Description URINE, CLEAN CATCH   Final   Special Requests NONE   Final   Culture  Setup Time     Final   Value: 09/23/2013 05:03     Performed at [facility] Count     Final   Value: NO GROWTH     Performed at [facility]   Culture     Final   Value: NO GROWTH     Performed at [facility]   Report Status 09/24/2013 FINAL   Final     Studies: Dg Chest Port 1 View  09/23/2013   CLINICAL DATA:  Shortness of breath.  EXAM: PORTABLE CHEST - 1 VIEW  COMPARISON:  DG CHEST 1V PORT dated 09/22/2013  FINDINGS: Cardiomediastinal silhouette is unremarkable for this low inspiratory portable examination with crowded vasculature markings. The lungs are clear without pleural effusions or focal  consolidations. Trachea projects midline and there is no pneumothorax. Included soft tissue planes and osseous structures are non-suspicious. Multiple EKG lines overlie the patient and may obscure subtle underlying pathology.  IMPRESSION: No acute cardiopulmonary process for this low inspiratory portable examination.   Electronically Signed   By: Awilda Metro  On: 09/23/2013 19:54   Dg Chest Port 1 View  09/22/2013   CLINICAL DATA:  Fever and arm pain.  EXAM: PORTABLE CHEST - 1 VIEW  COMPARISON:  01/28/2013  FINDINGS: No cardiomegaly for technique. Widened appearance of the upper mediastinum is likely related to semi recumbent positioning. Given low lung volumes, there is no definitive edema, consolidation, effusion, or pneumothorax.  IMPRESSION: Low lung volumes without definite edema or consolidation.   Electronically Signed   By: Jorje Guild M.D.   On: 09/22/2013 21:28    Scheduled Meds: . amLODipine  10 mg Oral Daily  . aspirin EC  81 mg Oral Daily  . carbidopa-levodopa  0.5 tablet Oral TID  . ceFEPime (MAXIPIME) IV  1 g Intravenous 3 times per day  . enoxaparin (LOVENOX) injection  50 mg Subcutaneous Q24H  . furosemide  20 mg Oral Daily  . insulin aspart  0-9 Units Subcutaneous TID WC  . levothyroxine  75 mcg Oral QAC breakfast  . montelukast  10 mg Oral QHS  . pantoprazole  40 mg Oral Daily  . simvastatin  20 mg Oral QPM  . vancomycin  1,000 mg Intravenous Q12H   Continuous Infusions:   Principal Problem:   Sepsis Active Problems:   DIABETES MELLITUS   OBSTRUCTIVE SLEEP APNEA   HYPERTENSION   Abnormality of gait   Dysphagia    Time spent: 25 min    [facility] Pager [phone]. If 7PM-7AM, please contact night-coverage at [email or web address], password [facility] 09/24/2013, 12:25 PM  LOS: 2 days

## 2013-09-24 NOTE — Evaluation (Signed)
Physical Therapy Evaluation Patient Details Name: Joshua Glass MRN: [id number] DOB: Jul 19, 1933 Today's Date: 09/24/2013 Time: 2202-5427 PT Time Calculation (min): 24 min  PT Assessment / Plan / Recommendation History of Present Illness  80 y.o. male admitted to [facility] on 09/22/13 with PMH of Gait disorder ( Parkinsonism vs Progressive supranuclear palsy), DM, HTN, Hypothyroidism, mild memory problems presents from home with the above complaints.  Pt is a very poor historian, his wife reports ongoing cough off and on for the past few months that worsened in the last few days, it is mostly nonproductive. UA was clear, CXR showed no pneumonia, last night he became short of breath due to fluid overload and required IV lasix 20 mg x 1. IV fluids were discontinued. Today he is breathing better, still having fever, without obvious source of infection. He has pain in the left shoulder, and unable to move the left shoulder.  MRI of shoulder pending.    Clinical Impression  The pt is progressing well with his mobility, better per wife than when he was admitted.  He is still not walking without hands on assist, though and needs to move as much as possible as he could easily get too deconditioned to go home with his wife.   PT to follow acutely for deficits listed below.       PT Assessment  Patient needs continued PT services    Follow Up Recommendations  Home health PT;Supervision for mobility/OOB    Does the patient have the potential to tolerate intense rehabilitation     NA  Barriers to Discharge   None, wife is strong and is able to be there with him 24/7.       Equipment Recommendations  None recommended by PT    Recommendations for Other Services   None  Frequency Min 3X/week    Precautions / Restrictions Precautions Precautions: Fall Precaution Comments: wife reports he tipps back at home   Pertinent Vitals/Pain See vitals flow sheet.      Mobility  Bed Mobility Overal bed mobility:  Needs Assistance Bed Mobility: Supine to Sit Supine to sit: Max assist General bed mobility comments: max assist due to pt trying to get up from compliant mattress and seems to be stuck in the "hole" in the bed.  He is also only able to pull with his right arm to assist to get to sitting.  Wife assist to sitting, and is doing ok handling him physically.   Transfers Overall transfer level: Needs assistance Equipment used: Rolling walker (2 wheeled) Transfers: Sit to/from Stand Sit to Stand: Min assist General transfer comment: Min assist to support trunk during transitions and to anteriorly weight shift body over feet.   Ambulation/Gait Ambulation/Gait assistance: Min assist Ambulation Distance (Feet): 25 Feet Assistive device: Rolling walker (2 wheeled) Gait Pattern/deviations: Step-through pattern;Shuffle;Trunk flexed Gait velocity: decreased Gait velocity interpretation: Below normal speed for age/gender General Gait Details: Pt with small, choppy, shuffling steps, but once anterior momentum was established, he really did not need very much physical support, only min assist at trunk for safety and min        PT Diagnosis: Difficulty walking;Abnormality of gait;Generalized weakness;Acute pain  PT Problem List: Decreased strength;Decreased range of motion;Decreased activity tolerance;Decreased balance;Decreased mobility;Decreased cognition;Decreased knowledge of use of DME;Decreased safety awareness;Pain PT Treatment Interventions: DME instruction;Gait training;Stair training;Functional mobility training;Therapeutic activities;Therapeutic exercise;Balance training;Neuromuscular re-education;Cognitive remediation;Patient/family education;Modalities     PT Goals(Current goals can be found in the care plan section) Acute Rehab PT  Goals Patient Stated Goal: to go home with his wife.   PT Goal Formulation: With patient/family Time For Goal Achievement: 10/08/13 Potential to Achieve Goals:  Good  Visit Information  Last PT Received On: 09/24/13 Assistance Needed: +1 History of Present Illness: 80 y.o. male admitted to [facility] on 09/22/13 with PMH of Gait disorder ( Parkinsonism vs Progressive supranuclear palsy), DM, HTN, Hypothyroidism, mild memory problems presents from home with the above complaints.  Pt is a very poor historian, his wife reports ongoing cough off and on for the past few months that worsened in the last few days, it is mostly nonproductive. UA was clear, CXR showed no pneumonia, last night he became short of breath due to fluid overload and required IV lasix 20 mg x 1. IV fluids were discontinued. Today he is breathing better, still having fever, without obvious source of infection. He has pain in the left shoulder, and unable to move the left shoulder.  MRI of shoulder pending.         Prior [place] expects to be discharged to:: Private residence Living Arrangements: Spouse/significant other Available Help at Discharge: Family;Available 24 hours/day Type of Home: House Home Access: Stairs to enter [employer] of Steps: 2 Entrance Stairs-Rails: Left Home Layout: Two level;Able to live on main level with bedroom/bathroom Alternate Level Stairs-Number of Steps: flight (pt doesn't go upstairs anymore) Home Equipment: Walker - 2 wheels;Cane - single point;Bedside commode;Grab bars - toilet;Grab bars - tub/shower;Shower seat;Hand held Hotel manager Prior Function Level of Independence: Needs assistance Gait / Transfers Assistance Needed: uses a walker at all times, falls backwards, but generally moves about with supervision.   ADL's / Homemaking Assistance Needed: wife helps with dressing.  Communication / Swallowing Assistance Needed: None Communication Communication: No difficulties Dominant Hand: Right    Cognition  Cognition Arousal/Alertness: Awake/alert Behavior During Therapy: WFL for tasks  assessed/performed Overall Cognitive Status: History of cognitive impairments - at baseline    Extremity/Trunk Assessment Upper Extremity Assessment Upper Extremity Assessment: LUE deficits/detail LUE Deficits / Details: left arm limited by intense pain, even to the touch, certainly can't use it to push or pull and cannot raise it against gravity.  He was able to use the RW.   Lower Extremity Assessment Lower Extremity Assessment: Generalized weakness Cervical / Trunk Assessment Cervical / Trunk Assessment: Other exceptions Cervical / Trunk Exceptions: forward head, rounded shoulders.   Balance Balance Overall balance assessment: Needs assistance Sitting-balance support: Bilateral upper extremity supported;Feet supported Sitting balance-Leahy Scale: Poor Postural control: Posterior lean Standing balance support: Bilateral upper extremity supported Standing balance-Leahy Scale: Poor  End of Session PT - End of Session Equipment Utilized During Treatment: Gait belt Activity Tolerance: Patient limited by pain;Patient limited by fatigue Patient left: in chair;with call bell/phone within reach;with family/visitor present Nurse Communication: Mobility status    Wells Guiles B. [place], [place], DPT [phone]   09/24/2013, 1:41 PM

## 2013-09-24 NOTE — Progress Notes (Signed)
Rt Note: Came to place pt on CPAP per wife pt in MRI. She states that she can place him on cpap when he gets back per home use. CPAP set up at bedside on auto titrate. H2o filled. I told her to call if they have any complications. RT will continue to monitor

## 2013-09-24 NOTE — Progress Notes (Signed)
NEURO HOSPITALIST PROGRESS NOTE   SUBJECTIVE:                                                                                                                        Per wife he has improved in mental status.  He is now awake and able to follow commands.  NO complaints at this time.   OBJECTIVE:                                                                                                                           Vital signs in last 24 hours: Temp:  [97.9 F (36.6 C)-102.3 F (39.1 C)] 97.9 F (36.6 C) (03/06 0623) Pulse Rate:  [69-96] 69 (03/06 1056) Resp:  [17-23] 18 (03/06 0623) BP: (152-218)/(62-76) 152/72 mmHg (03/06 1056) SpO2:  [93 %-98 %] 93 % (03/06 1015)  Intake/Output from previous day: 03/05 0701 - 03/06 0700 In: 300 [IV Piggyback:300] Out: 2000 [Urine:2000] Intake/Output this shift: Total I/O In: 240 [P.O.:240] Out: 650 [Urine:650] Nutritional status: Carb Control  Past Medical History  Diagnosis Date  . 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 Type II or unspecified type diabetes mellitus without mention of complication, not stated as uncontrolled   . Allergic rhinitis, cause unspecified   . Unspecified essential hypertension   . Obstructive sleep apnea (adult) (pediatric)     NPSG 09-15-03 AHI 84.5, CPAP 18/ AHI 0  . Neuropathy   . Carpal tunnel syndrome on both sides   . Obesity   . Gallstone     no need for surgery per Dr. Deon Pilling  . A-fib   . Paralysis agitans 04/22/2013  . Abnormality of gait 04/22/2013  . Polyneuropathy in diabetes(357.2) 04/22/2013  . Skin cancer   . Esophageal stricture     Requiring dilation     Neurologic Exam:  Mental Status: Alert, oriented, thought content appropriate.  Speech fluent without evidence of aphasia.  Able to follow 3 step commands without difficulty. Cranial Nerves: II:  Visual fields grossly normal,  pupils equal, round, reactive to light and accommodation III,IV, VI: ptosis not present, extra-ocular motions intact bilaterally V,VII: smile symmetric, facial light touch sensation normal bilaterally VIII:  hearing normal bilaterally IX,X: gag reflex present XI: bilateral shoulder shrug XII: midline tongue extension without atrophy or fasciculations  Motor: Right : Upper extremity   5/5    Left:     Upper extremity   4/5 due to pain in shoulder  Lower extremity   5/5     Lower extremity   5/5 --postural tremor noted in right UE, slight cog wheeling, positive palmomental Tone and bulk:normal tone throughout; no atrophy noted Sensory: Pinprick and light touch intact throughout, bilaterally Deep Tendon Reflexes:  1+ throughout UE and KJ no AJ  Plantars: Mute bilaterally Cerebellar: normal finger-to-nose,  normal heel-to-shin test    Lab Results: Basic Metabolic Panel:  Recent Labs Lab 09/22/13 2104 09/23/13 1100 09/23/13 2015  NA 138 140 137  K 3.4* 3.4* 3.3*  CL 98 104 99  CO2 21 24 23   GLUCOSE 237* 200* 181*  BUN 15 10 11   CREATININE 1.08 0.89 0.98  CALCIUM 8.8 8.2* 8.5    Liver Function Tests:  Recent Labs Lab 09/22/13 2104  AST 29  ALT 8  ALKPHOS 94  BILITOT 1.0  PROT 7.4  ALBUMIN 3.3*   No results found for this basename: LIPASE, AMYLASE,  in the last 168 hours No results found for this basename: AMMONIA,  in the last 168 hours  CBC:  Recent Labs Lab 09/22/13 2104 09/23/13 1100  WBC 15.0* 11.0*  NEUTROABS 10.5*  --   HGB 13.8 12.6*  HCT 40.4 37.0*  MCV 81.9 82.4  PLT 254 224    Cardiac Enzymes: No results found for this basename: CKTOTAL, CKMB, CKMBINDEX, TROPONINI,  in the last 168 hours  Lipid Panel: No results found for this basename: CHOL, TRIG, HDL, CHOLHDL, VLDL, LDLCALC,  in the last 168 hours  CBG:  Recent Labs Lab 09/23/13 0744 09/23/13 1202 09/23/13 1625 09/23/13 2106 09/24/13 0808  GLUCAP 154* 168* 135* 180* 136*     Microbiology: Results for orders placed during the hospital encounter of 09/22/13  CULTURE, BLOOD (ROUTINE X 2)     Status: None   Collection Time    09/22/13  9:04 PM      Result Value Ref Range Status   Specimen Description BLOOD HAND RIGHT   Final   Special Requests BOTTLES DRAWN AEROBIC AND ANAEROBIC 10CC   Final   Culture  Setup Time     Final   Value: 09/23/2013 04:33     Performed at [facility]   Culture     Final   Value:        BLOOD CULTURE RECEIVED NO GROWTH TO DATE CULTURE WILL BE HELD FOR 5 DAYS BEFORE ISSUING A FINAL NEGATIVE REPORT     Performed at [facility]   Report Status PENDING   Incomplete  CULTURE, BLOOD (ROUTINE X 2)     Status: None   Collection Time    09/22/13  9:09 PM      Result Value Ref Range Status   Specimen Description BLOOD ARM RIGHT   Final   Special Requests BOTTLES DRAWN AEROBIC AND ANAEROBIC 10CC   Final   Culture  Setup Time     Final   Value: 09/23/2013 04:34     Performed at [facility]   Culture     Final   Value:        BLOOD CULTURE RECEIVED NO GROWTH TO DATE CULTURE WILL BE HELD FOR 5 DAYS BEFORE ISSUING A FINAL NEGATIVE REPORT  Performed at [facility]   Report Status PENDING   Incomplete  URINE CULTURE     Status: None   Collection Time    09/22/13 11:34 PM      Result Value Ref Range Status   Specimen Description URINE, CLEAN CATCH   Final   Special Requests NONE   Final   Culture  Setup Time     Final   Value: 09/23/2013 05:03     Performed at [place]     Final   Value: NO GROWTH     Performed at [facility]   Culture     Final   Value: NO GROWTH     Performed at [facility]   Report Status 09/24/2013 FINAL   Final    Coagulation Studies:  Recent Labs  09/22/13 2104  LABPROT 13.2  INR 1.02    Imaging: Dg Chest Port 1 View  09/23/2013   CLINICAL DATA:  Shortness of breath.  EXAM: PORTABLE CHEST - 1 VIEW  COMPARISON:  DG  CHEST 1V PORT dated 09/22/2013  FINDINGS: Cardiomediastinal silhouette is unremarkable for this low inspiratory portable examination with crowded vasculature markings. The lungs are clear without pleural effusions or focal consolidations. Trachea projects midline and there is no pneumothorax. Included soft tissue planes and osseous structures are non-suspicious. Multiple EKG lines overlie the patient and may obscure subtle underlying pathology.  IMPRESSION: No acute cardiopulmonary process for this low inspiratory portable examination.   Electronically Signed   By: Elon Alas   On: 09/23/2013 19:54   Dg Chest Port 1 View  09/22/2013   CLINICAL DATA:  Fever and arm pain.  EXAM: PORTABLE CHEST - 1 VIEW  COMPARISON:  01/28/2013  FINDINGS: No cardiomegaly for technique. Widened appearance of the upper mediastinum is likely related to semi recumbent positioning. Given low lung volumes, there is no definitive edema, consolidation, effusion, or pneumothorax.  IMPRESSION: Low lung volumes without definite edema or consolidation.   Electronically Signed   By: Jorje Guild M.D.   On: 09/22/2013 21:28       MEDICATIONS                                                                                                                        Scheduled: . amLODipine  10 mg Oral Daily  . aspirin EC  81 mg Oral Daily  . carbidopa-levodopa  0.5 tablet Oral TID  . ceFEPime (MAXIPIME) IV  1 g Intravenous 3 times per day  . enoxaparin (LOVENOX) injection  50 mg Subcutaneous Q24H  . furosemide  20 mg Oral Daily  . insulin aspart  0-9 Units Subcutaneous TID WC  . levothyroxine  75 mcg Oral QAC breakfast  . montelukast  10 mg Oral QHS  . pantoprazole  40 mg Oral Daily  . potassium chloride  40 mEq Oral Q4H  . simvastatin  20 mg Oral QPM  . vancomycin  1,000  mg Intravenous Q12H    ASSESSMENT/PLAN:                                                                                                              Generalized weakness and exacerbation of difficulty with mobility most likely secondary to effects of the patient's acute febrile illness worsening underlying movement abnormalities secondary to Parkinson's disease. Cannot rule out worsening secondary to increase in Sinemet.  As fever has resolved his mental status and weakness has improved.    Recommend: 1. No change in current dose of Sinemet 25-100, for now. IF worsening or rigidity would return to home dose.  2. Physical therapy assessment the patient's gait and recommendations for safe ambulation, as well as possible continued physical therapy after discharge (via home health services).  3. Follow up appointment with outpatient neurologist following discharge from the hospital.  Neurology S/O  Assessment and plan discussed with with attending physician and they are in agreement.    Etta Quill PA-C [facility] [phone]  09/24/2013, 11:18 AM

## 2013-09-25 ENCOUNTER — Inpatient Hospital Stay (HOSPITAL_COMMUNITY): Payer: Medicare Other

## 2013-09-25 DIAGNOSIS — R269 Unspecified abnormalities of gait and mobility: Secondary | ICD-10-CM

## 2013-09-25 DIAGNOSIS — M25519 Pain in unspecified shoulder: Secondary | ICD-10-CM

## 2013-09-25 DIAGNOSIS — M25412 Effusion, left shoulder: Secondary | ICD-10-CM

## 2013-09-25 DIAGNOSIS — R509 Fever, unspecified: Secondary | ICD-10-CM

## 2013-09-25 DIAGNOSIS — M75120 Complete rotator cuff tear or rupture of unspecified shoulder, not specified as traumatic: Secondary | ICD-10-CM

## 2013-09-25 DIAGNOSIS — M25419 Effusion, unspecified shoulder: Secondary | ICD-10-CM

## 2013-09-25 DIAGNOSIS — J189 Pneumonia, unspecified organism: Secondary | ICD-10-CM

## 2013-09-25 DIAGNOSIS — J309 Allergic rhinitis, unspecified: Secondary | ICD-10-CM

## 2013-09-25 LAB — GLUCOSE, CAPILLARY
Glucose-Capillary: 133 mg/dL — ABNORMAL HIGH (ref 70–99)
Glucose-Capillary: 138 mg/dL — ABNORMAL HIGH (ref 70–99)
Glucose-Capillary: 117 mg/dL — ABNORMAL HIGH (ref 70–99)
Glucose-Capillary: 157 mg/dL — ABNORMAL HIGH (ref 70–99)

## 2013-09-25 LAB — CBC
HCT: 39.8 % (ref 39.0–52.0)
Hemoglobin: 13.5 g/dL (ref 13.0–17.0)
MCH: 28 pg (ref 26.0–34.0)
MCHC: 33.9 g/dL (ref 30.0–36.0)
MCV: 82.6 fL (ref 78.0–100.0)
PLATELETS: 267 10*3/uL (ref 150–400)
RBC: 4.82 MIL/uL (ref 4.22–5.81)
RDW: 15.4 % (ref 11.5–15.5)
WBC: 12.4 10*3/uL — ABNORMAL HIGH (ref 4.0–10.5)

## 2013-09-25 LAB — SYNOVIAL CELL COUNT + DIFF, W/ CRYSTALS
EOSINOPHILS-SYNOVIAL: 0 % (ref 0–1)
Lymphocytes-Synovial Fld: 0 % (ref 0–20)
Monocyte-Macrophage-Synovial Fluid: 0 % — ABNORMAL LOW (ref 50–90)
Neutrophil, Synovial: 0 % (ref 0–25)
WBC, Synovial: 1 /mm3 (ref 0–200)

## 2013-09-25 LAB — GRAM STAIN

## 2013-09-25 LAB — BASIC METABOLIC PANEL
BUN: 13 mg/dL (ref 6–23)
CALCIUM: 8.9 mg/dL (ref 8.4–10.5)
CO2: 23 meq/L (ref 19–32)
CREATININE: 1.06 mg/dL (ref 0.50–1.35)
Chloride: 101 mEq/L (ref 96–112)
GFR calc Af Amer: 76 mL/min — ABNORMAL LOW (ref >90)
GFR, EST NON AFRICAN AMERICAN: 65 mL/min — AB (ref >90)
GLUCOSE: 137 mg/dL — AB (ref 70–99)
Potassium: 3.5 mEq/L — ABNORMAL LOW (ref 3.7–5.3)
Sodium: 139 mEq/L (ref 137–147)

## 2013-09-25 MED ORDER — IOHEXOL 180 MG/ML  SOLN
20.0000 mL | Freq: Once | INTRAMUSCULAR | Status: AC | PRN
  Administered 2013-09-25: 5 mL via INTRAVENOUS

## 2013-09-25 MED ORDER — POTASSIUM CHLORIDE CRYS ER 20 MEQ PO TBCR
40.0000 meq | EXTENDED_RELEASE_TABLET | Freq: Once | ORAL | Status: AC
  Administered 2013-09-25: 40 meq via ORAL
  Filled 2013-09-25: qty 2

## 2013-09-25 MED ORDER — ENOXAPARIN SODIUM 60 MG/0.6ML ~~LOC~~ SOLN
50.0000 mg | SUBCUTANEOUS | Status: DC
  Administered 2013-09-26 – 2013-09-27 (×2): 50 mg via SUBCUTANEOUS
  Filled 2013-09-25 (×2): qty 0.6

## 2013-09-25 NOTE — Progress Notes (Signed)
ANTIBIOTIC CONSULT NOTE - FOLLOW UP  Pharmacy Consult for vancomycin Indication: rule out sepsis  Allergies  Allergen Reactions  . Hydrocodone Other (See Comments)    Lethargic per wife    Patient Measurements: Height: 5' 6.14" (168 cm) Weight: 240 lb (108.863 kg) IBW/kg (Calculated) : 64.13  Vital Signs: Temp: 98 F (36.7 C) (03/07 0519) Temp src: Oral (03/07 0519) BP: 183/93 mmHg (03/07 0519) Pulse Rate: 72 (03/07 0519) Intake/Output from previous day: 03/06 0701 - 03/07 0700 In: 1120 [P.O.:480; I.V.:90; IV Piggyback:550] Out: 2325 [Urine:2325] Intake/Output from this shift:    Labs:  Recent Labs  09/22/13 2104 09/23/13 1100 09/23/13 2015 09/25/13 0510  WBC 15.0* 11.0*  --  12.4*  HGB 13.8 12.6*  --  13.5  PLT 254 224  --  267  CREATININE 1.08 0.89 0.98 1.06   Estimated Creatinine Clearance: 66.6 ml/min (by C-G formula based on Cr of 1.06). No results found for this basename: VANCOTROUGH, VANCOPEAK, VANCORANDOM, GENTTROUGH, GENTPEAK, GENTRANDOM, TOBRATROUGH, TOBRAPEAK, TOBRARND, AMIKACINPEAK, AMIKACINTROU, AMIKACIN,  in the last 72 hours   Microbiology: Recent Results (from the past 720 hour(s))  CULTURE, BLOOD (ROUTINE X 2)     Status: None   Collection Time    09/22/13  9:04 PM      Result Value Ref Range Status   Specimen Description BLOOD HAND RIGHT   Final   Special Requests BOTTLES DRAWN AEROBIC AND ANAEROBIC 10CC   Final   Culture  Setup Time     Final   Value: 09/23/2013 04:33     Performed at [facility]   Culture     Final   Value:        BLOOD CULTURE RECEIVED NO GROWTH TO DATE CULTURE WILL BE HELD FOR 5 DAYS BEFORE ISSUING A FINAL NEGATIVE REPORT     Performed at [facility]   Report Status PENDING   Incomplete  CULTURE, BLOOD (ROUTINE X 2)     Status: None   Collection Time    09/22/13  9:09 PM      Result Value Ref Range Status   Specimen Description BLOOD ARM RIGHT   Final   Special Requests BOTTLES DRAWN AEROBIC AND  ANAEROBIC 10CC   Final   Culture  Setup Time     Final   Value: 09/23/2013 04:34     Performed at [facility]   Culture     Final   Value:        BLOOD CULTURE RECEIVED NO GROWTH TO DATE CULTURE WILL BE HELD FOR 5 DAYS BEFORE ISSUING A FINAL NEGATIVE REPORT     Performed at [facility]   Report Status PENDING   Incomplete  URINE CULTURE     Status: None   Collection Time    09/22/13 11:34 PM      Result Value Ref Range Status   Specimen Description URINE, CLEAN CATCH   Final   Special Requests NONE   Final   Culture  Setup Time     Final   Value: 09/23/2013 05:03     Performed at [place]     Final   Value: NO GROWTH     Performed at [facility]   Culture     Final   Value: NO GROWTH     Performed at [facility]   Report Status 09/24/2013 FINAL   Final    Anti-infectives   Start  Dose/Rate Route Frequency Ordered Stop   09/23/13 1000  vancomycin (VANCOCIN) IVPB 1000 mg/200 mL premix     1,000 mg 200 mL/hr over 60 Minutes Intravenous Every 12 hours 09/22/13 2302     09/23/13 0600  ceFEPIme (MAXIPIME) 1 g in dextrose 5 % 50 mL IVPB     1 g 100 mL/hr over 30 Minutes Intravenous 3 times per day 09/22/13 2302     09/22/13 2115  vancomycin (VANCOCIN) 2,000 mg in sodium chloride 0.9 % 500 mL IVPB     2,000 mg 250 mL/hr over 120 Minutes Intravenous  Once 09/22/13 2102 09/23/13 0003   09/22/13 2100  ceFEPIme (MAXIPIME) 2 g in dextrose 5 % 50 mL IVPB     2 g 100 mL/hr over 30 Minutes Intravenous  Once 09/22/13 2057 09/22/13 2203   09/22/13 2100  vancomycin (VANCOCIN) IVPB 1000 mg/200 mL premix  Status:  Discontinued     1,000 mg 200 mL/hr over 60 Minutes Intravenous  Once 09/22/13 2057 09/22/13 2101      80 yr old male admitted 09/22/2013 Via the ED with increasing SOB, decreased oral intake and decreased urine output. A code sepsis was called and pharmacy was asked to dose Cefepime and Vancomycin.   09/25/2013 MRI  result pending looking for source of infection  PMH: DM, HTN, gait disorder  Coag: VTE Px, Body mass index is 38.57 kg/(m^2). Enox 40 q24h, > increase to 50 q12h  ID. HCAP? cxr neg, to MRI sholder ? abcess AF, wbc trend up 3/4 Ucx  3/4 BC x2  Flu neg Cefepime 3/4>> vanc 3/4>>  CV: HTN SBP up to 180 Amlo, asa, furo  Endo: DM,  SSi, levothryoxine  Neurology: sinemet dose was incr 2 weeks ago and MD thinks this may be cause of weakness; neuro consulted   Neph: CrCl ~65 ml/min K 3.5, supp in progress  Goal of Therapy:  Vancomycin trough level 15-20 mcg/ml  Plan:  1. Continue vancomycin and cefepime for empiric coverage, follow up MRI result looking for infection source. 2.  Follow up vancomycin trough with am labs if remains on vancomycin thru tomorrow.   Thank you for allowing pharmacy to be a part of this patients care team.  Rowe Robert Pharm.D., BCPS Clinical Pharmacist 09/25/2013 9:11 AM Pager: [phone] Phone: [phone]
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
## 2013-09-25 NOTE — Progress Notes (Addendum)
Patient ID: Joshua Glass  male  HAL:[id number]    DOB: October 17, 1932    DOA: 09/22/2013  PCP: Joshua Jacobson, MD  Addendum MRI reviewed IMPRESSION:  1. Abnormal joint effusion with enhancing margins favoring either  synovitis or septic joint. If arthrocentesis is undertaken, a  posterior prior approach probably has the best chance of yielding  joint fluid based on the larger pocket of fluid posteriorly.  2. Abnormal edema and enhancement throughout much of the deltoid  muscle and in the remaining infraspinatus muscle, query myositis.  3. Chronically ruptured supraspinatus and infraspinatus tendons.  Full-thickness partial width tearing of the upper subscapularis  tendon. The biceps tendon is poorly seen and may be ruptured.  - Called orthopedic consult, will likely need arthrocentesis - Also called infectious disease consult, Dr. Megan Glass, recommended to continue with vancomycin and cefepime, and will evaluate patient for further recommendations. - 2-D echo showed EF of 60-65%, grade 1 basilar dysfunction   Joshua Glass M.D. [facility] 09/25/2013, 10:12 AM  Pager: [phone]      Assessment/Plan: Principal Problem: Fevers with Acute bronchitis- still spiking fever overnight him to 101.2 -influenza PCR is negative, repeat CXR shows no pneumonia. - Blood cultures negative to date, urine culture negative - Continue Vancomycin and cefepime - Awaiting left shoulder MRI   Left shoulder pain/? Abscess-  -Patient reports he had a fall one year ago where he had injured his head and left shoulder but he has been unable to move his left shoulder for last 3-4 days  -On exam he has significant pain and decrease in the range of movement, called radiology for reading of the MRI of the left shoulder. He may need ID consult or orthopedics, previously seen Dr Joshua Glass.     Dyspnea with Pulmonary edema-  - Significantly improved, overnight on 3/6 had developed pulmonary edema, and  required IV lasix x 1, IV fluids have been discontinued. BNP 376 - Currently on oral Lasix, neg 2.5L - 2-D echo showed EF of 24-26%, grade 1 diastolic dysfunction   Parkinson's disease- Dose of Sinemet was increased recently by neurologist, which could have caused the weakness. Discussed with neuro by Dr Joshua Glass and Joshua Quill PA has seen the patient and recommend to keep the patient on Sinemet 0.5 tab po TID, and d/c home at this dose . Can follow up with neuro as outpatient.   Dysphagia- continue with Dys 2 diet   Diabetes mellitus- Continue with SSI   Hypertension- Continue with Amlodipine, start Hydralazine prn.    DVT Prophylaxis:   Code Status: DNR  Family Communication:Patient alert and oriented, discussed with the patient in detail  Disposition: home when stable  Consultants:  none  Procedures:  none  Antibiotics:  Vanc 3/5>>  Cefepime 3/5>>  Subjective: Significant pain on moving the left shoulder, slight wheezing, spiking temp   Objective: Weight change:   Intake/Output Summary (Last 24 hours) at 09/25/13 0922 Last data filed at 09/25/13 0535  Gross per 24 hour  Intake    880 ml  Output   2325 ml  Net  -1445 ml   Blood pressure 183/93, pulse 72, temperature 98 F (36.7 C), temperature source Oral, resp. rate 20, height 5' 6.14" (1.68 m), weight 108.863 kg (240 lb), SpO2 97.00%.  Physical Exam: General: Alert and awake, oriented x3, not in any acute distress. HEENT: anicteric sclera, PERLA, EOMI CVS: S1-S2 clear, no murmur rubs or gallops Chest: slight wheezing, no rales or rhonchi Abdomen: soft nontender, nondistended,  normal bowel sounds  Extremities: no cyanosis, clubbing or edema noted bilaterally. Left arm/ shoulder ROM sign decreased  Neuro: Cranial nerves II-XII intact, no focal neurological deficits  Lab Results: Basic Metabolic Panel:  Recent Labs Lab 09/23/13 2015 09/25/13 0510  NA 137 139  K 3.3* 3.5*  CL 99 101  CO2 23 23    GLUCOSE 181* 137*  BUN 11 13  CREATININE 0.98 1.06  CALCIUM 8.5 8.9   Liver Function Tests:  Recent Labs Lab 09/22/13 2104  AST 29  ALT 8  ALKPHOS 94  BILITOT 1.0  PROT 7.4  ALBUMIN 3.3*   No results found for this basename: LIPASE, AMYLASE,  in the last 168 hours No results found for this basename: AMMONIA,  in the last 168 hours CBC:  Recent Labs Lab 09/22/13 2104 09/23/13 1100 09/25/13 0510  WBC 15.0* 11.0* 12.4*  NEUTROABS 10.5*  --   --   HGB 13.8 12.6* 13.5  HCT 40.4 37.0* 39.8  MCV 81.9 82.4 82.6  PLT 254 224 267   Cardiac Enzymes: No results found for this basename: CKTOTAL, CKMB, CKMBINDEX, TROPONINI,  in the last 168 hours BNP: No components found with this basename: POCBNP,  CBG:  Recent Labs Lab 09/24/13 0808 09/24/13 1127 09/24/13 1622 09/24/13 2245 09/25/13 0733  GLUCAP 136* 157* 142* 147* 133*     Micro Results: Recent Results (from the past 240 hour(s))  CULTURE, BLOOD (ROUTINE X 2)     Status: None   Collection Time    09/22/13  9:04 PM      Result Value Ref Range Status   Specimen Description BLOOD HAND RIGHT   Final   Special Requests BOTTLES DRAWN AEROBIC AND ANAEROBIC 10CC   Final   Culture  Setup Time     Final   Value: 09/23/2013 04:33     Performed at [facility]   Culture     Final   Value:        BLOOD CULTURE RECEIVED NO GROWTH TO DATE CULTURE WILL BE HELD FOR 5 DAYS BEFORE ISSUING A FINAL NEGATIVE REPORT     Performed at [facility]   Report Status PENDING   Incomplete  CULTURE, BLOOD (ROUTINE X 2)     Status: None   Collection Time    09/22/13  9:09 PM      Result Value Ref Range Status   Specimen Description BLOOD ARM RIGHT   Final   Special Requests BOTTLES DRAWN AEROBIC AND ANAEROBIC 10CC   Final   Culture  Setup Time     Final   Value: 09/23/2013 04:34     Performed at [facility]   Culture     Final   Value:        BLOOD CULTURE RECEIVED NO GROWTH TO DATE CULTURE WILL BE HELD FOR  5 DAYS BEFORE ISSUING A FINAL NEGATIVE REPORT     Performed at [facility]   Report Status PENDING   Incomplete  URINE CULTURE     Status: None   Collection Time    09/22/13 11:34 PM      Result Value Ref Range Status   Specimen Description URINE, CLEAN CATCH   Final   Special Requests NONE   Final   Culture  Setup Time     Final   Value: 09/23/2013 05:03     Performed at [place]     Final   Value:  NO GROWTH     Performed at [facility]   Culture     Final   Value: NO GROWTH     Performed at [facility]   Report Status 09/24/2013 FINAL   Final    Studies/Results: Dg Chest Port 1 View  09/23/2013   CLINICAL DATA:  Shortness of breath.  EXAM: PORTABLE CHEST - 1 VIEW  COMPARISON:  DG CHEST 1V PORT dated 09/22/2013  FINDINGS: Cardiomediastinal silhouette is unremarkable for this low inspiratory portable examination with crowded vasculature markings. The lungs are clear without pleural effusions or focal consolidations. Trachea projects midline and there is no pneumothorax. Included soft tissue planes and osseous structures are non-suspicious. Multiple EKG lines overlie the patient and may obscure subtle underlying pathology.  IMPRESSION: No acute cardiopulmonary process for this low inspiratory portable examination.   Electronically Signed   By: Elon Alas   On: 09/23/2013 19:54   Dg Chest Port 1 View  09/22/2013   CLINICAL DATA:  Fever and arm pain.  EXAM: PORTABLE CHEST - 1 VIEW  COMPARISON:  01/28/2013  FINDINGS: No cardiomegaly for technique. Widened appearance of the upper mediastinum is likely related to semi recumbent positioning. Given low lung volumes, there is no definitive edema, consolidation, effusion, or pneumothorax.  IMPRESSION: Low lung volumes without definite edema or consolidation.   Electronically Signed   By: Jorje Guild M.D.   On: 09/22/2013 21:28    Medications: Scheduled Meds: . amLODipine  10 mg Oral  Daily  . aspirin EC  81 mg Oral Daily  . carbidopa-levodopa  0.5 tablet Oral TID  . ceFEPime (MAXIPIME) IV  1 g Intravenous 3 times per day  . enoxaparin (LOVENOX) injection  50 mg Subcutaneous Q24H  . furosemide  20 mg Oral Daily  . insulin aspart  0-9 Units Subcutaneous TID WC  . levothyroxine  75 mcg Oral QAC breakfast  . montelukast  10 mg Oral QHS  . pantoprazole  40 mg Oral Daily  . potassium chloride  40 mEq Oral Once  . simvastatin  20 mg Oral QPM  . vancomycin  1,000 mg Intravenous Q12H      LOS: 3 days   Juel Ripley M.D. [facility] 09/25/2013, 9:22 AM Pager: [phone]  If 7PM-7AM, please contact night-coverage [email or web address] Password [email or web address]

## 2013-09-25 NOTE — Procedures (Signed)
The patient underwent successful fluoroscopically directed arthrocentesis of the left glenohumeral joint. Approximately 1 cc of turbid fluid was obtained and sent to the laboratory for analysis as requested by Dr. Doran Durand.

## 2013-09-25 NOTE — Consult Note (Signed)
[facility] for Infectious Disease    Date of Admission:  09/22/2013           Day 4 vancomycin        Day 4 cefepime       Reason for Consult: Fever and left shoulder effusion    Referring Physician: Dr. Estill Cotta  Principal Problem:   Sepsis Active Problems:   Fever   Effusion of left shoulder   DIABETES MELLITUS   OBSTRUCTIVE SLEEP APNEA   HYPERTENSION   Complete tear of right rotator cuff   Parkinson's syndrome   Abnormality of gait   Polyneuropathy in diabetes(357.2)   Dysphagia   . amLODipine  10 mg Oral Daily  . aspirin EC  81 mg Oral Daily  . carbidopa-levodopa  0.5 tablet Oral TID  . ceFEPime (MAXIPIME) IV  1 g Intravenous 3 times per day  . furosemide  20 mg Oral Daily  . insulin aspart  0-9 Units Subcutaneous TID WC  . levothyroxine  75 mcg Oral QAC breakfast  . montelukast  10 mg Oral QHS  . pantoprazole  40 mg Oral Daily  . simvastatin  20 mg Oral QPM  . vancomycin  1,000 mg Intravenous Q12H    Recommendations: 1. Continue current antibiotics pending left shoulder aspiration   Assessment: The simultaneous onset of severe left shoulder pain, joint effusion and fever as highly suggestive of septic arthritis. I agree with aspiration and consideration of arthroscopic incision and drainage. I will continue current empiric antibiotics and followup tomorrow.    HPI: Joshua Glass is a 80 y.o. male with a history of left rotator cuff tear who started doing some light, weight exercises about one week ago. He began to notice severe, increasing left shoulder pain followed by the onset of high fever leading to admission 4 days ago. He was started on empiric antibiotics. Admission blood cultures have been negative. Exam and MRI show a large left shoulder effusion. Aspiration attempts today by Dr. Doran Durand failed to yield any fluid and he is scheduled for aspiration by interventional radiology.   Review of Systems: Constitutional: positive for chills,  fevers and malaise, negative for anorexia, sweats and weight loss Eyes: negative Ears, nose, mouth, throat, and face: negative Respiratory: positive for cough, negative for dyspnea on exertion, hemoptysis, pleurisy/chest pain, sputum and wheezing Cardiovascular: negative Gastrointestinal: negative Genitourinary:negative Musculoskeletal:positive for severe left shoulder pain that he rates 10 out of 10, no new pain in his knees  Past Medical History  Diagnosis Date  . 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 Type II or unspecified type diabetes mellitus without mention of complication, not stated as uncontrolled   . Allergic rhinitis, cause unspecified   . Unspecified essential hypertension   . Obstructive sleep apnea (adult) (pediatric)     NPSG 09-15-03 AHI 84.5, CPAP 18/ AHI 0  . Neuropathy   . Carpal tunnel syndrome on both sides   . Obesity   . Gallstone     no need for surgery per Dr. Deon Pilling  . A-fib   . Paralysis agitans 04/22/2013  . Abnormality of gait 04/22/2013  . Polyneuropathy in diabetes(357.2) 04/22/2013  . Skin cancer   . Esophageal stricture     Requiring dilation  . Hypothyroidism   . H/O hiatal hernia     History  Substance Use Topics  . Smoking status: Former Smoker -- 1.50  packs/day for 16 years    Types: Cigarettes    Quit date: 07/22/1970  . Smokeless tobacco: Never Used  . Alcohol Use: No    Family History  Problem Relation Age of Onset  . Stroke Mother   . Liver disease Father   . Other Father     Legionaire's  . Diabetes Brother   . Coronary artery disease Brother     CABG   Allergies  Allergen Reactions  . Hydrocodone Other (See Comments)    Lethargic per wife    OBJECTIVE: Blood pressure 183/93, pulse 72, temperature 98 F (36.7 C), temperature source Oral, resp. rate 20, height 5' 6.14" (1.68 m), weight 108.863 kg (240 lb), SpO2 97.00%. General: He is alert,  talkative and in no distress Skin: No rash Lungs: Clear Cor: Regular S1 and S2 with no murmurs Abdomen: Nontender Joints and extremities: Left shoulder swollen and painful to touch or with range of motion  Lab Results Lab Results  Component Value Date   WBC 12.4* 09/25/2013   HGB 13.5 09/25/2013   HCT 39.8 09/25/2013   MCV 82.6 09/25/2013   PLT 267 09/25/2013    Lab Results  Component Value Date   CREATININE 1.06 09/25/2013   BUN 13 09/25/2013   NA 139 09/25/2013   K 3.5* 09/25/2013   CL 101 09/25/2013   CO2 23 09/25/2013    Lab Results  Component Value Date   ALT 8 09/22/2013   AST 29 09/22/2013   ALKPHOS 94 09/22/2013   BILITOT 1.0 09/22/2013     Microbiology: Recent Results (from the past 240 hour(s))  CULTURE, BLOOD (ROUTINE X 2)     Status: None   Collection Time    09/22/13  9:04 PM      Result Value Ref Range Status   Specimen Description BLOOD HAND RIGHT   Final   Special Requests BOTTLES DRAWN AEROBIC AND ANAEROBIC 10CC   Final   Culture  Setup Time     Final   Value: 09/23/2013 04:33     Performed at [facility]   Culture     Final   Value:        BLOOD CULTURE RECEIVED NO GROWTH TO DATE CULTURE WILL BE HELD FOR 5 DAYS BEFORE ISSUING A FINAL NEGATIVE REPORT     Performed at [facility]   Report Status PENDING   Incomplete  CULTURE, BLOOD (ROUTINE X 2)     Status: None   Collection Time    09/22/13  9:09 PM      Result Value Ref Range Status   Specimen Description BLOOD ARM RIGHT   Final   Special Requests BOTTLES DRAWN AEROBIC AND ANAEROBIC 10CC   Final   Culture  Setup Time     Final   Value: 09/23/2013 04:34     Performed at [facility]   Culture     Final   Value:        BLOOD CULTURE RECEIVED NO GROWTH TO DATE CULTURE WILL BE HELD FOR 5 DAYS BEFORE ISSUING A FINAL NEGATIVE REPORT     Performed at [facility]   Report Status PENDING   Incomplete  URINE CULTURE     Status: None   Collection Time    09/22/13 11:34 PM      Result Value  Ref Range Status   Specimen Description URINE, CLEAN CATCH   Final   Special Requests NONE   Final   Culture  Setup Time     Final   Value: 09/23/2013 05:03     Performed at [facility]     Final   Value: NO GROWTH     Performed at [facility]   Culture     Final   Value: NO GROWTH     Performed at [facility]   Report Status 09/24/2013 FINAL   Final    Michel Bickers, MD [facility] [phone] pager   [phone] cell 09/25/2013, 2:27 PM

## 2013-09-25 NOTE — Progress Notes (Signed)
Physical Therapy Treatment Patient Details Name: Joshua Glass MRN: [id number] DOB: 1933/06/20 Today's Date: 09/25/2013 Time: 3151-7616 PT Time Calculation (min): 23 min  PT Assessment / Plan / Recommendation  History of Present Illness 80 y.o. male admitted to [facility] on 09/22/13 with PMH of Gait disorder ( Parkinsonism vs Progressive supranuclear palsy), DM, HTN, Hypothyroidism, mild memory problems presents from home with the above complaints. CXR showed no pneumonia, last night he became short of breath due to fluid overload and required IV lasix 20 mg x 1. IV fluids were discontinued. Today he is breathing better, still having fever, without obvious source of infection. He has pain in the left shoulder, and unable to move the left shoulder.  MRI revealed possible septic lt shoulder joint. Pt for aspiration of lt shoulder and possible arthoscopy.   PT Comments   Pt making steady progress. For lt shoulder aspiration later.  Follow Up Recommendations  Home health PT;Supervision for mobility/OOB     Does the patient have the potential to tolerate intense rehabilitation     Barriers to Discharge        Equipment Recommendations  None recommended by PT    Recommendations for Other Services    Frequency Min 3X/week   Progress towards PT Goals Progress towards PT goals: Progressing toward goals  Plan Current plan remains appropriate    Precautions / Restrictions Precautions Precautions: Fall   Pertinent Vitals/Pain No c/o's except lt shoulder.    Mobility  Bed Mobility Overal bed mobility: Needs Assistance Bed Mobility: Supine to Sit Supine to sit: Max assist General bed mobility comments: Assist to bring trunk up an hips to EOB. Transfers Overall transfer level: Needs assistance Equipment used: Rolling walker (2 wheeled) Transfers: Sit to/from Stand Sit to Stand: Mod assist General transfer comment: Assist to bring hips up. Ambulation/Gait Ambulation/Gait assistance: Min  assist Ambulation Distance (Feet): 90 Feet Assistive device: Rolling walker (2 wheeled) Gait Pattern/deviations: Step-through pattern;Decreased step length - right;Decreased step length - left;Shuffle;Trunk flexed General Gait Details: Verbal cues to slow down as approaching turns. Pt gaining forward momentum and unsure if he could control this.    Exercises     PT Diagnosis:    PT Problem List:   PT Treatment Interventions:     PT Goals (current goals can now be found in the care plan section)    Visit Information  Last PT Received On: 09/25/13 Assistance Needed: +1 History of Present Illness: 80 y.o. male admitted to [facility] on 09/22/13 with PMH of Gait disorder ( Parkinsonism vs Progressive supranuclear palsy), DM, HTN, Hypothyroidism, mild memory problems presents from home with the above complaints. CXR showed no pneumonia, last night he became short of breath due to fluid overload and required IV lasix 20 mg x 1. IV fluids were discontinued. Today he is breathing better, still having fever, without obvious source of infection. He has pain in the left shoulder, and unable to move the left shoulder.  MRI revealed possible septic lt shoulder joint. Pt for aspiration of lt shoulder and possible arthoscopy.    Subjective Data      Cognition  Cognition Arousal/Alertness: Awake/alert Behavior During Therapy: WFL for tasks assessed/performed Overall Cognitive Status: History of cognitive impairments - at baseline    Balance  Balance Overall balance assessment: Needs assistance Sitting-balance support: Bilateral upper extremity supported;Feet supported Sitting balance-Leahy Scale: Poor Postural control: Posterior lean;Right lateral lean Standing balance support: Bilateral upper extremity supported Standing balance-Leahy Scale: Poor Standing balance comment: Min A  to maintain standing with rolling walker.  End of Session PT - End of Session Equipment Utilized During Treatment: Gait  belt Activity Tolerance: Patient tolerated treatment well Patient left: in bed;with call bell/phone within reach;with family/visitor present Nurse Communication: Mobility status   GP     Jolynne Spurgin 09/25/2013, 1:52 PM  [facility] PT [phone]

## 2013-09-26 DIAGNOSIS — M109 Gout, unspecified: Secondary | ICD-10-CM | POA: Diagnosis present

## 2013-09-26 DIAGNOSIS — J209 Acute bronchitis, unspecified: Secondary | ICD-10-CM | POA: Diagnosis present

## 2013-09-26 LAB — GLUCOSE, CAPILLARY
GLUCOSE-CAPILLARY: 169 mg/dL — AB (ref 70–99)
Glucose-Capillary: 132 mg/dL — ABNORMAL HIGH (ref 70–99)
Glucose-Capillary: 147 mg/dL — ABNORMAL HIGH (ref 70–99)
Glucose-Capillary: 161 mg/dL — ABNORMAL HIGH (ref 70–99)

## 2013-09-26 LAB — VANCOMYCIN, TROUGH
Vancomycin Tr: 34.8 ug/mL (ref 10.0–20.0)
Vancomycin Tr: 14.4 ug/mL (ref 10.0–20.0)

## 2013-09-26 MED ORDER — METHYLPREDNISOLONE SODIUM SUCC 125 MG IJ SOLR
60.0000 mg | Freq: Once | INTRAMUSCULAR | Status: AC
  Administered 2013-09-26: 60 mg via INTRAVENOUS
  Filled 2013-09-26: qty 0.96

## 2013-09-26 MED ORDER — PREDNISONE 50 MG PO TABS
60.0000 mg | ORAL_TABLET | Freq: Every day | ORAL | Status: DC
  Administered 2013-09-27: 60 mg via ORAL
  Filled 2013-09-26 (×2): qty 1

## 2013-09-26 MED ORDER — DEXTROMETHORPHAN POLISTIREX 30 MG/5ML PO LQCR
15.0000 mg | Freq: Two times a day (BID) | ORAL | Status: DC
  Administered 2013-09-26 – 2013-09-27 (×2): 15 mg via ORAL
  Filled 2013-09-26 (×3): qty 5

## 2013-09-26 NOTE — Progress Notes (Signed)
ANTIBIOTIC CONSULT NOTE - FOLLOW UP  Pharmacy Consult for vancomycin Indication: rule out sepsis  Allergies  Allergen Reactions  . Hydrocodone Other (See Comments)    Lethargic per wife    Patient Measurements: Height: 5' 6.14" (168 cm) Weight: 240 lb (108.863 kg) IBW/kg (Calculated) : 64.13  Vital Signs: Temp: 98.8 F (37.1 C) (03/08 0525) Temp src: Oral (03/08 0525) BP: 160/69 mmHg (03/08 0525) Pulse Rate: 68 (03/08 0525) Intake/Output from previous day: 03/07 0701 - 03/08 0700 In: 1194 [P.O.:125; I.V.:90; IV Piggyback:550] Out: 1250 [Urine:1250] Intake/Output from this shift:    Labs:  Recent Labs  09/23/13 2015 09/25/13 0510  WBC  --  12.4*  HGB  --  13.5  PLT  --  267  CREATININE 0.98 1.06   Estimated Creatinine Clearance: 66.6 ml/min (by C-G formula based on Cr of 1.06).  Recent Labs  09/26/13 0950  VANCOTROUGH 34.8*     Microbiology: Recent Results (from the past 720 hour(s))  CULTURE, BLOOD (ROUTINE X 2)     Status: None   Collection Time    09/22/13  9:04 PM      Result Value Ref Range Status   Specimen Description BLOOD HAND RIGHT   Final   Special Requests BOTTLES DRAWN AEROBIC AND ANAEROBIC 10CC   Final   Culture  Setup Time     Final   Value: 09/23/2013 04:33     Performed at [facility]   Culture     Final   Value:        BLOOD CULTURE RECEIVED NO GROWTH TO DATE CULTURE WILL BE HELD FOR 5 DAYS BEFORE ISSUING A FINAL NEGATIVE REPORT     Performed at [facility]   Report Status PENDING   Incomplete  CULTURE, BLOOD (ROUTINE X 2)     Status: None   Collection Time    09/22/13  9:09 PM      Result Value Ref Range Status   Specimen Description BLOOD ARM RIGHT   Final   Special Requests BOTTLES DRAWN AEROBIC AND ANAEROBIC 10CC   Final   Culture  Setup Time     Final   Value: 09/23/2013 04:34     Performed at [facility]   Culture     Final   Value:        BLOOD CULTURE RECEIVED NO GROWTH TO DATE CULTURE WILL BE  HELD FOR 5 DAYS BEFORE ISSUING A FINAL NEGATIVE REPORT     Performed at [facility]   Report Status PENDING   Incomplete  URINE CULTURE     Status: None   Collection Time    09/22/13 11:34 PM      Result Value Ref Range Status   Specimen Description URINE, CLEAN CATCH   Final   Special Requests NONE   Final   Culture  Setup Time     Final   Value: 09/23/2013 05:03     Performed at [facility]     Final   Value: NO GROWTH     Performed at [facility]   Culture     Final   Value: NO GROWTH     Performed at [facility]   Report Status 09/24/2013 FINAL   Final  ANAEROBIC CULTURE     Status: None   Collection Time    09/25/13  2:41 PM      Result Value Ref Range Status   Specimen  Description FLUID SYNOVIAL LEFT SHOULDER   Final   Special Requests ASPIRATE   Final   Gram Stain     Final   Value: CYTOSPIN WBC PRESENT, PREDOMINANTLY MONONUCLEAR     NO ORGANISMS SEEN     Performed at [facility]     Performed at [facility]   Culture PENDING   Incomplete   Report Status PENDING   Incomplete  BODY FLUID CULTURE     Status: None   Collection Time    09/25/13  2:42 PM      Result Value Ref Range Status   Specimen Description FLUID SYNOVIAL LEFT SHOULDER   Final   Special Requests ASPIRATE   Final   Gram Stain     Final   Value: CYTOSPIN WBC PRESENT, PREDOMINANTLY MONONUCLEAR     NO ORGANISMS SEEN     Performed at [facility]     Performed at [facility]   Culture PENDING   Incomplete   Report Status PENDING   Incomplete  GRAM STAIN     Status: None   Collection Time    09/25/13  2:43 PM      Result Value Ref Range Status   Specimen Description FLUID SYNOVIAL LEFT SHOULDER   Final   Special Requests ASPIRATE   Final   Gram Stain     Final   Value: CYTOSPIN     WBC PRESENT, PREDOMINANTLY MONONUCLEAR     NO ORGANISMS SEEN   Report Status 09/25/2013 FINAL   Final    Anti-infectives   Start      Dose/Rate Route Frequency Ordered Stop   09/23/13 1000  vancomycin (VANCOCIN) IVPB 1000 mg/200 mL premix     1,000 mg 200 mL/hr over 60 Minutes Intravenous Every 12 hours 09/22/13 2302     09/23/13 0600  ceFEPIme (MAXIPIME) 1 g in dextrose 5 % 50 mL IVPB     1 g 100 mL/hr over 30 Minutes Intravenous 3 times per day 09/22/13 2302     09/22/13 2115  vancomycin (VANCOCIN) 2,000 mg in sodium chloride 0.9 % 500 mL IVPB     2,000 mg 250 mL/hr over 120 Minutes Intravenous  Once 09/22/13 2102 09/23/13 0003   09/22/13 2100  ceFEPIme (MAXIPIME) 2 g in dextrose 5 % 50 mL IVPB     2 g 100 mL/hr over 30 Minutes Intravenous  Once 09/22/13 2057 09/22/13 2203   09/22/13 2100  vancomycin (VANCOCIN) IVPB 1000 mg/200 mL premix  Status:  Discontinued     1,000 mg 200 mL/hr over 60 Minutes Intravenous  Once 09/22/13 2057 09/22/13 2101      80 yr old male admitted 09/22/2013 Via the ED with increasing SOB, decreased oral intake and decreased urine output. A code sepsis was called and pharmacy was asked to dose Cefepime and Vancomycin.   3.9 Vancomycin trough high, but drawn 20 minutes into infusion, difficult to asseds truth of the concentration.  Note ID consulted concern for septic arthritis.  PMH: DM, HTN, gait disorder  Coag: VTE Px, Body mass index is 38.57 kg/(m^2). Enox 40 q24h, > increase to 50 q12h  ID. HCAP? cxr neg, to MRI sholder ? abcess AF, wbc trend up 3/4 Ucx  3/4 BC x2  Flu neg Cefepime 3/4>> vanc 3/4>>  CV: HTN SBP 160-180 Amlo, asa, furo  Endo: DM,  Glucose < 150 SSi, levothryoxine  Neurology: sinemet dose was incr 2 weeks ago and MD thinks this may  be cause of weakness; neuro consulted   Neph: CrCl ~65 ml/min K 3.5, supp in progress  Goal of Therapy:  Vancomycin trough level 15-20 mcg/ml  Plan:  1. For now, discontinue vancomycin, plan to check vancomycin trough prior to tonights dose.  Thank you for allowing pharmacy to be a part of this patients care  team.  Rowe Robert Pharm.D., BCPS Clinical Pharmacist 09/26/2013 11:35 AM Pager: [phone] Phone: [phone]

## 2013-09-26 NOTE — Progress Notes (Signed)
Orthopedics Progress Note  Subjective: The shoulder still hurts  Objective:  Filed Vitals:   09/26/13 0525  BP: 160/69  Pulse: 68  Temp: 98.8 F (37.1 C)  Resp: 18    General: Awake and alert  Musculoskeletal: left shoulder with generalized swelling and tenderness, no erythema, pain with ROM Neurovascularly intact  Lab Results  Component Value Date   WBC 12.4* 09/25/2013   HGB 13.5 09/25/2013   HCT 39.8 09/25/2013   MCV 82.6 09/25/2013   PLT 267 09/25/2013       Component Value Date/Time   NA 139 09/25/2013 0510   K 3.5* 09/25/2013 0510   CL 101 09/25/2013 0510   CO2 23 09/25/2013 0510   GLUCOSE 137* 09/25/2013 0510   BUN 13 09/25/2013 0510   CREATININE 1.06 09/25/2013 0510   CALCIUM 8.9 09/25/2013 0510   GFRNONAA 65* 09/25/2013 0510   GFRAA 76* 09/25/2013 0510    Lab Results  Component Value Date   INR 1.02 09/22/2013   INR 1.05 10/05/2011    Assessment/Plan:  s/p aspiration of left shoulder. Crystals noted and few WBCs, no organisms. No growth so far on cultures. 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 No evidence for left shoulder sepsis Will follow   Doran Heater. Veverly Fells, MD 09/26/2013 10:45 AM

## 2013-09-26 NOTE — Progress Notes (Addendum)
Patient ID: Joshua Glass  male  NWG:[id number]    DOB: [birth year]    DOA: 09/22/2013  PCP: Myriam Jacobson, MD   Assessment/Plan: Principal Problem: Fevers with Acute bronchitis -influenza PCR is negative, repeat CXR shows no pneumonia. - Blood cultures negative to date, urine culture negative - stop Abx per ID -left shoulder MRI revealed effusion- see below  Left shoulder pain/ Gout -Patient reports he had a fall one year ago where he had injured his head and left shoulder but he has been unable to move his left shoulder for last 3-4 days  -On exam he has significant pain and decrease in the range of movement- - aspirated by IR- aspirate consistent with crystalline arthropathy- monosodium urate crystals- culture neg  - no new meds prescribed by ortho- per pt, the doctor today mentioned possible steroid shot but as outpt - will give him a dose of Solumedrol and place him on prednison to start in AM  Urinary retention - remove foley in AM for voiding trial- I have ordered this  Dyspnea with Pulmonary edema-  - Significantly improved, overnight on 3/6 had developed pulmonary edema, and required IV lasix x 1, IV fluids have been discontinued. BNP 376 - Currently on oral Lasix, neg 2.5L still - 2-D echo showed EF of 57-84%, grade 1 diastolic dysfunction  Parkinson's disease- Dose of Sinemet was increased recently by neurologist, which could have caused the weakness.  Dr Darrick Meigs d/w neuro - Etta Quill PA has seen the patient and recommend to keep the patient on Sinemet 0.5 tab po TID, and d/c home at this dose . Can follow up with neuro as outpatient.   Dysphagia- continue with Dys 2 diet   Diabetes mellitus- Continue with SSI   Hypertension- Continue with Amlodipine, start Hydralazine prn.    DVT Prophylaxis:   Code Status: DNR  Family Communication:Patient alert and oriented, discussed with the patient and wife in detail  Disposition: home wit home health PT when  stable  Consultants:  ID  Ortho   IR  Procedures:  none  Antibiotics:  Vanc 3/5>>  Cefepime 3/5>>  Subjective: Left shoulder painful. Cough continues- asking for a surpressant- no other complaints.   Objective: Weight change:   Intake/Output Summary (Last 24 hours) at 09/26/13 1149 Last data filed at 09/26/13 0644  Gross per 24 hour  Intake   1215 ml  Output   1250 ml  Net    -35 ml   Blood pressure 160/69, pulse 68, temperature 98.8 F (37.1 C), temperature source Oral, resp. rate 18, height 5' 6.14" (1.68 m), weight 108.863 kg (240 lb), SpO2 94.00%.  Physical Exam: General: Alert and awake, oriented x3, not in any acute distress. HEENT: anicteric sclera, PERLA, EOMI CVS: S1-S2 clear, no murmur rubs or gallops Chest: mild scattered wheezing, no rales or rhonchi Abdomen: soft nontender, nondistended, normal bowel sounds  Extremities: no cyanosis, clubbing or edema noted bilaterally. Left arm/ shoulder ROM decreased an painful Neuro: Cranial nerves II-XII intact, no focal neurological deficits  Lab Results: Basic Metabolic Panel:  Recent Labs Lab 09/23/13 2015 09/25/13 0510  NA 137 139  K 3.3* 3.5*  CL 99 101  CO2 23 23  GLUCOSE 181* 137*  BUN 11 13  CREATININE 0.98 1.06  CALCIUM 8.5 8.9   Liver Function Tests:  Recent Labs Lab 09/22/13 2104  AST 29  ALT 8  ALKPHOS 94  BILITOT 1.0  PROT 7.4  ALBUMIN 3.3*   No results  found for this basename: LIPASE, AMYLASE,  in the last 168 hours No results found for this basename: AMMONIA,  in the last 168 hours CBC:  Recent Labs Lab 09/22/13 2104 09/23/13 1100 09/25/13 0510  WBC 15.0* 11.0* 12.4*  NEUTROABS 10.5*  --   --   HGB 13.8 12.6* 13.5  HCT 40.4 37.0* 39.8  MCV 81.9 82.4 82.6  PLT 254 224 267   Cardiac Enzymes: No results found for this basename: CKTOTAL, CKMB, CKMBINDEX, TROPONINI,  in the last 168 hours BNP: No components found with this basename: POCBNP,  CBG:  Recent  Labs Lab 09/25/13 0733 09/25/13 1133 09/25/13 1659 09/25/13 2140 09/26/13 0654  GLUCAP 133* 138* 117* 157* 132*     Micro Results: Recent Results (from the past 240 hour(s))  CULTURE, BLOOD (ROUTINE X 2)     Status: None   Collection Time    09/22/13  9:04 PM      Result Value Ref Range Status   Specimen Description BLOOD HAND RIGHT   Final   Special Requests BOTTLES DRAWN AEROBIC AND ANAEROBIC 10CC   Final   Culture  Setup Time     Final   Value: 09/23/2013 04:33     Performed at [facility]   Culture     Final   Value:        BLOOD CULTURE RECEIVED NO GROWTH TO DATE CULTURE WILL BE HELD FOR 5 DAYS BEFORE ISSUING A FINAL NEGATIVE REPORT     Performed at [facility]   Report Status PENDING   Incomplete  CULTURE, BLOOD (ROUTINE X 2)     Status: None   Collection Time    09/22/13  9:09 PM      Result Value Ref Range Status   Specimen Description BLOOD ARM RIGHT   Final   Special Requests BOTTLES DRAWN AEROBIC AND ANAEROBIC 10CC   Final   Culture  Setup Time     Final   Value: 09/23/2013 04:34     Performed at [facility]   Culture     Final   Value:        BLOOD CULTURE RECEIVED NO GROWTH TO DATE CULTURE WILL BE HELD FOR 5 DAYS BEFORE ISSUING A FINAL NEGATIVE REPORT     Performed at [facility]   Report Status PENDING   Incomplete  URINE CULTURE     Status: None   Collection Time    09/22/13 11:34 PM      Result Value Ref Range Status   Specimen Description URINE, CLEAN CATCH   Final   Special Requests NONE   Final   Culture  Setup Time     Final   Value: 09/23/2013 05:03     Performed at [facility]     Final   Value: NO GROWTH     Performed at [facility]   Culture     Final   Value: NO GROWTH     Performed at [facility]   Report Status 09/24/2013 FINAL   Final  ANAEROBIC CULTURE     Status: None   Collection Time    09/25/13  2:41 PM      Result Value Ref Range Status   Specimen  Description FLUID SYNOVIAL LEFT SHOULDER   Final   Special Requests ASPIRATE   Final   Gram Stain     Final   Value: CYTOSPIN WBC PRESENT, PREDOMINANTLY MONONUCLEAR  NO ORGANISMS SEEN     Performed at [facility]     Performed at [facility]   Culture PENDING   Incomplete   Report Status PENDING   Incomplete  BODY FLUID CULTURE     Status: None   Collection Time    09/25/13  2:42 PM      Result Value Ref Range Status   Specimen Description FLUID SYNOVIAL LEFT SHOULDER   Final   Special Requests ASPIRATE   Final   Gram Stain     Final   Value: CYTOSPIN WBC PRESENT, PREDOMINANTLY MONONUCLEAR     NO ORGANISMS SEEN     Performed at [facility]     Performed at [facility]   Culture PENDING   Incomplete   Report Status PENDING   Incomplete  GRAM STAIN     Status: None   Collection Time    09/25/13  2:43 PM      Result Value Ref Range Status   Specimen Description FLUID SYNOVIAL LEFT SHOULDER   Final   Special Requests ASPIRATE   Final   Gram Stain     Final   Value: CYTOSPIN     WBC PRESENT, PREDOMINANTLY MONONUCLEAR     NO ORGANISMS SEEN   Report Status 09/25/2013 FINAL   Final    Studies/Results: Dg Chest Port 1 View  09/23/2013   CLINICAL DATA:  Shortness of breath.  EXAM: PORTABLE CHEST - 1 VIEW  COMPARISON:  DG CHEST 1V PORT dated 09/22/2013  FINDINGS: Cardiomediastinal silhouette is unremarkable for this low inspiratory portable examination with crowded vasculature markings. The lungs are clear without pleural effusions or focal consolidations. Trachea projects midline and there is no pneumothorax. Included soft tissue planes and osseous structures are non-suspicious. Multiple EKG lines overlie the patient and may obscure subtle underlying pathology.  IMPRESSION: No acute cardiopulmonary process for this low inspiratory portable examination.   Electronically Signed   By: Elon Alas   On: 09/23/2013 19:54   Dg Chest Port 1  View  09/22/2013   CLINICAL DATA:  Fever and arm pain.  EXAM: PORTABLE CHEST - 1 VIEW  COMPARISON:  01/28/2013  FINDINGS: No cardiomegaly for technique. Widened appearance of the upper mediastinum is likely related to semi recumbent positioning. Given low lung volumes, there is no definitive edema, consolidation, effusion, or pneumothorax.  IMPRESSION: Low lung volumes without definite edema or consolidation.   Electronically Signed   By: Jorje Guild M.D.   On: 09/22/2013 21:28    Medications: Scheduled Meds: . amLODipine  10 mg Oral Daily  . aspirin EC  81 mg Oral Daily  . carbidopa-levodopa  0.5 tablet Oral TID  . ceFEPime (MAXIPIME) IV  1 g Intravenous 3 times per day  . enoxaparin (LOVENOX) injection  50 mg Subcutaneous Q24H  . furosemide  20 mg Oral Daily  . insulin aspart  0-9 Units Subcutaneous TID WC  . levothyroxine  75 mcg Oral QAC breakfast  . montelukast  10 mg Oral QHS  . pantoprazole  40 mg Oral Daily  . simvastatin  20 mg Oral QPM      LOS: 4 days   [facility] M.D. [facility] 09/26/2013, 11:49 AM Pager: [phone]  If 7PM-7AM, please contact night-coverage [email or web address] Password [email or web address]

## 2013-09-26 NOTE — Progress Notes (Signed)
Notified Pharmacist Ben of vanc level of 34.8

## 2013-09-26 NOTE — Progress Notes (Signed)
RT arrived in room and patient had already placed himself on CPAP.  CPAP is set to auto titrate.  RT added water for humidification. Pt is tolerating at this time. RT will continue to monitor.

## 2013-09-26 NOTE — Progress Notes (Addendum)
Patient ID: Joshua Glass, male   DOB: 05-02-1933, 80 y.o.   MRN: [id number]         [facility]    Date of Admission:  09/22/2013           Day 5 vancomycin        Day 5 cefepime  Principal Problem:   Sepsis Active Problems:   Fever   Effusion of left shoulder   DIABETES MELLITUS   OBSTRUCTIVE SLEEP APNEA   HYPERTENSION   Complete tear of right rotator cuff   Parkinson's syndrome   Abnormality of gait   Polyneuropathy in diabetes(357.2)   Dysphagia   Acute bronchitis   Gout   . amLODipine  10 mg Oral Daily  . aspirin EC  81 mg Oral Daily  . carbidopa-levodopa  0.5 tablet Oral TID  . ceFEPime (MAXIPIME) IV  1 g Intravenous 3 times per day  . enoxaparin (LOVENOX) injection  50 mg Subcutaneous Q24H  . furosemide  20 mg Oral Daily  . insulin aspart  0-9 Units Subcutaneous TID WC  . levothyroxine  75 mcg Oral QAC breakfast  . montelukast  10 mg Oral QHS  . pantoprazole  40 mg Oral Daily  . simvastatin  20 mg Oral QPM    Subjective: He is feeling better with less shoulder pain.  Objective: Temp:  [98.8 F (37.1 C)-99.7 F (37.6 C)] 98.8 F (37.1 C) (03/08 0525) Pulse Rate:  [68-79] 68 (03/08 0525) Resp:  [18] 18 (03/08 0525) BP: (160-175)/(65-69) 160/69 mmHg (03/08 0525) SpO2:  [93 %-94 %] 94 % (03/08 0525)  General: He is alert and in no distress Skin: No rash Lungs: Clear Cor: Regular S1 and S2 no murmurs Abdomen: Nontender Joints and extremities: He is reluctant to move his left shoulder but he has minimal pain with range of motion and it appears to be less swollen and warm  Lab Results Lab Results  Component Value Date   WBC 12.4* 09/25/2013   HGB 13.5 09/25/2013   HCT 39.8 09/25/2013   MCV 82.6 09/25/2013   PLT 267 09/25/2013    Lab Results  Component Value Date   CREATININE 1.06 09/25/2013   BUN 13 09/25/2013   NA 139 09/25/2013   K 3.5* 09/25/2013   CL 101 09/25/2013   CO2 23 09/25/2013    Lab Results  Component Value Date   ALT 8  09/22/2013   AST 29 09/22/2013   ALKPHOS 94 09/22/2013   BILITOT 1.0 09/22/2013   Vancomycin level: 34.8 but this was drawn during vancomycin infusion  Synovial fluid cell count: Only 1 white blood cell. Monosodium urate crystals noted  Microbiology: Recent Results (from the past 240 hour(s))  CULTURE, BLOOD (ROUTINE X 2)     Status: None   Collection Time    09/22/13  9:04 PM      Result Value Ref Range Status   Specimen Description BLOOD HAND RIGHT   Final   Special Requests BOTTLES DRAWN AEROBIC AND ANAEROBIC 10CC   Final   Culture  Setup Time     Final   Value: 09/23/2013 04:33     Performed at [facility]   Culture     Final   Value:        BLOOD CULTURE RECEIVED NO GROWTH TO DATE CULTURE WILL BE HELD FOR 5 DAYS BEFORE ISSUING A FINAL NEGATIVE REPORT     Performed at [facility]   Report Status  PENDING   Incomplete  CULTURE, BLOOD (ROUTINE X 2)     Status: None   Collection Time    09/22/13  9:09 PM      Result Value Ref Range Status   Specimen Description BLOOD ARM RIGHT   Final   Special Requests BOTTLES DRAWN AEROBIC AND ANAEROBIC 10CC   Final   Culture  Setup Time     Final   Value: 09/23/2013 04:34     Performed at [facility]   Culture     Final   Value:        BLOOD CULTURE RECEIVED NO GROWTH TO DATE CULTURE WILL BE HELD FOR 5 DAYS BEFORE ISSUING A FINAL NEGATIVE REPORT     Performed at [facility]   Report Status PENDING   Incomplete  URINE CULTURE     Status: None   Collection Time    09/22/13 11:34 PM      Result Value Ref Range Status   Specimen Description URINE, CLEAN CATCH   Final   Special Requests NONE   Final   Culture  Setup Time     Final   Value: 09/23/2013 05:03     Performed at [facility]     Final   Value: NO GROWTH     Performed at [facility]   Culture     Final   Value: NO GROWTH     Performed at [facility]   Report Status 09/24/2013 FINAL   Final  ANAEROBIC  CULTURE     Status: None   Collection Time    09/25/13  2:41 PM      Result Value Ref Range Status   Specimen Description FLUID SYNOVIAL LEFT SHOULDER   Final   Special Requests ASPIRATE   Final   Gram Stain     Final   Value: CYTOSPIN WBC PRESENT, PREDOMINANTLY MONONUCLEAR     NO ORGANISMS SEEN     Performed at [facility]     Performed at [facility]   Culture     Final   Value: NO ANAEROBES ISOLATED; CULTURE IN PROGRESS FOR 5 DAYS     Performed at [facility]   Report Status PENDING   Incomplete  BODY FLUID CULTURE     Status: None   Collection Time    09/25/13  2:42 PM      Result Value Ref Range Status   Specimen Description FLUID SYNOVIAL LEFT SHOULDER   Final   Special Requests ASPIRATE   Final   Gram Stain     Final   Value: CYTOSPIN WBC PRESENT, PREDOMINANTLY MONONUCLEAR     NO ORGANISMS SEEN     Performed at [facility]     Performed at [facility]   Culture     Final   Value: NO GROWTH 1 DAY     Performed at [facility]   Report Status PENDING   Incomplete  GRAM STAIN     Status: None   Collection Time    09/25/13  2:43 PM      Result Value Ref Range Status   Specimen Description FLUID SYNOVIAL LEFT SHOULDER   Final   Special Requests ASPIRATE   Final   Gram Stain     Final   Value: CYTOSPIN     WBC PRESENT, PREDOMINANTLY MONONUCLEAR     NO ORGANISMS SEEN   Report Status  09/25/2013 FINAL   Final    Studies/Results: Dg Arthro Shoulder Left  09/25/2013   CLINICAL DATA:  Fever of unknown margin, sepsis, suspect infected left glenohumeral joint  EXAM: ARTHROGRAM OF THE LEFT SHOULDER  TECHNIQUE: The anticipated procedure was discussed with Mr.Born. He voiced his willingness to proceed. A time-out procedure was called. The skin of the left shoulder was marked with an indelible marker. The skin was cleansed with an antiseptic solution. The subcutaneous tissues were infiltrated with 4 cc of 1% lidocaine. Subsequently  under fluoroscopic guidance a 20 gauge spinal needle was placed into the anterior inferior aspect of the left glenohumeral joint space. Approximately 2 cc of Omnipaque 180 was instilled to confirm positioning. Aspiration of the joint did not immediately result in fluid. Subsequently approximately 8 cc of sterile normal saline was instilled into the joint. Withdrawal of approximately 1 cc of joint fluid was then possible. This was turbid and slightly blood-stained. This fluid was then diluted with an additional 2 cc of saline and sent to the laboratory for analysis as requested.  Fluoro time was 25 seconds  COMPARISON:  None.  FINDINGS: Please see above  IMPRESSION: The patient underwent successful fluoroscopically directed arthrocentesis of the left glenohumeral joint. Approximately 1 cc of turbid fluid was obtained and sent to the laboratory for analysis as requested by Dr. Doran Durand.   Electronically Signed   By: David  Martinique   On: 09/25/2013 15:16   Mr Shoulder Left W Wo Contrast  09/25/2013   CLINICAL DATA:  Left arm pain and swelling.  Fever.  EXAM: MRI OF THE LEFT SHOULDER WITHOUT AND WITH CONTRAST  TECHNIQUE: Multiplanar, multisequence MR imaging was performed both before and after administration of intravenous contrast.  CONTRAST:  31mL MULTIHANCE GADOBENATE DIMEGLUMINE 529 MG/ML IV SOLN  COMPARISON:  None.  FINDINGS: Despite efforts by the technologist and patient, severe motion artifact is present on today's exam and could not be eliminated. This reduces exam sensitivity and specificity.  ROTATOR CUFF: Full-thickness full width rupture of the supraspinatus retracted back to the plan of the glenoid. Full-thickness full width rupture of the infraspinatus tendon retracted back to the plane of the glenoid. Full-thickness partial width tear of the upper portion of the subscapularis. Moderate teres minor tendinopathy.  MUSCLES: Abnormal edema in the deltoid (especially posteriorly) and tracking within and along  the teres minor, with associated abnormal enhancement throughout much of the deltoid and teres minor. There is fatty atrophy in the supraspinatus, infraspinatus, and to a lesser degree in the subscapularis.  BICEPS LONG HEAD: Intra-articular segment not well seen, quite likely torn.  ACROMIOCLAVICULAR JOINT: Mild degenerative arthropathy. Subacromial morphology is type 2 (curved).  GLENOHUMERAL JOINT: Joint effusion communicates with the subacromial subdeltoid bursa. Larger pocket of joint fluid posteriorly. Abnormal enhancement along the joint and bursal margins.  LABRUM:  Indeterminate due to motion artifact.  BONES:  Intact except as noted above.  IMPRESSION: 1. Abnormal joint effusion with enhancing margins favoring either synovitis or septic joint. If arthrocentesis is undertaken, a posterior prior approach probably has the best chance of yielding joint fluid based on the larger pocket of fluid posteriorly. 2. Abnormal edema and enhancement throughout much of the deltoid muscle and in the remaining infraspinatus muscle, query myositis. 3. Chronically ruptured supraspinatus and infraspinatus tendons. Full-thickness partial width tearing of the upper subscapularis tendon. The biceps tendon is poorly seen and may be ruptured. 4. Despite efforts by the technologist and patient, motion artifact is present on today's exam and  could not be eliminated. This reduces exam sensitivity and specificity. 5. No compelling findings of osteomyelitis.   Electronically Signed   By: Sherryl Barters M.D.   On: 09/25/2013 09:43    Assessment: It appears that his recent fever is due to a flare of gout in his left shoulder. He does have a remote history of gout in both hands. His blood cultures are negative and his synovial fluid analyses did not suggest infection so I will stop his empiric antibiotics.  Plan: 1. Discontinue vancomycin and cefepime  Michel Bickers, MD [facility] [phone] pager   [phone] cell 09/26/2013, 2:10 PM

## 2013-09-27 DIAGNOSIS — J209 Acute bronchitis, unspecified: Secondary | ICD-10-CM

## 2013-09-27 LAB — URIC ACID: Uric Acid, Serum: 4.8 mg/dL (ref 4.0–7.8)

## 2013-09-27 LAB — GLUCOSE, CAPILLARY
GLUCOSE-CAPILLARY: 211 mg/dL — AB (ref 70–99)
Glucose-Capillary: 262 mg/dL — ABNORMAL HIGH (ref 70–99)

## 2013-09-27 MED ORDER — CARBIDOPA-LEVODOPA 25-100 MG PO TABS: 0.5000 | ORAL_TABLET | Freq: Three times a day (TID) | ORAL | Status: DC

## 2013-09-27 MED ORDER — NAPROXEN 500 MG PO TABS: 500.0000 mg | ORAL_TABLET | Freq: Two times a day (BID) | ORAL | Status: DC

## 2013-09-27 MED ORDER — TAMSULOSIN HCL 0.4 MG PO CAPS
0.4000 mg | ORAL_CAPSULE | Freq: Every day | ORAL | Status: DC
  Administered 2013-09-27: 0.4 mg via ORAL
  Filled 2013-09-27: qty 1

## 2013-09-27 MED ORDER — DEXTROMETHORPHAN POLISTIREX 30 MG/5ML PO LQCR: 15.0000 mg | Freq: Two times a day (BID) | ORAL | Status: AC

## 2013-09-27 MED ORDER — PREDNISONE 10 MG PO TABS: ORAL_TABLET | ORAL | Status: DC

## 2013-09-27 MED ORDER — OMEPRAZOLE 40 MG PO CPDR: 40.0000 mg | DELAYED_RELEASE_CAPSULE | Freq: Every day | ORAL | Status: DC

## 2013-09-27 MED ORDER — TAMSULOSIN HCL 0.4 MG PO CAPS: 0.4000 mg | ORAL_CAPSULE | Freq: Every day | ORAL | Status: DC

## 2013-09-27 MED ORDER — TRAMADOL HCL 50 MG PO TABS: 50.0000 mg | ORAL_TABLET | Freq: Four times a day (QID) | ORAL | Status: DC | PRN

## 2013-09-27 MED ORDER — FUROSEMIDE 20 MG PO TABS: 20.0000 mg | ORAL_TABLET | Freq: Every day | ORAL | Status: DC

## 2013-09-27 MED ORDER — ALLOPURINOL 300 MG PO TABS: 300.0000 mg | ORAL_TABLET | Freq: Every day | ORAL | Status: DC

## 2013-09-27 NOTE — Discharge Summary (Addendum)
Physician Discharge Summary  Patient ID: Joshua Glass MRN: [id number] DOB/AGE: [birth year] 80 y.o.  Admit date: 09/22/2013 Discharge date: 09/27/2013  Primary Care Physician:  Myriam Jacobson, MD  Discharge Diagnoses:    . acute left shoulder gouty arthritis flare  . Acute bronchitis . DIABETES MELLITUS . Abnormality of gait . OBSTRUCTIVE SLEEP APNEA . HYPERTENSION . Dysphagia . Complete tear of right rotator cuff . Polyneuropathy in diabetes(357.2) . Parkinson's syndrome . Acute bronchitis . Gout  Consults: Infectious disease, Dr. Megan Salon                    Orthopedics, Dr. Doran Durand   Recommendations for Outpatient Follow-up:  Patient needs to follow with orthopedics, may need ESI injection in the left shoulder joint  Patient has acute gout attack in the left shoulder joint, he is started on allopurinol, NSAIDs and steroid taper. Please follow BMET for renal function  Allergies:   Allergies  Allergen Reactions  . Hydrocodone Other (See Comments)    Lethargic per wife     Discharge Medications:   Medication List    STOP taking these medications       ibuprofen 200 MG tablet  Commonly known as:  ADVIL,MOTRIN      TAKE these medications       acetaminophen 500 MG tablet  Commonly known as:  TYLENOL  Take 1,000 mg by mouth every 6 (six) hours as needed. pain     allopurinol 300 MG tablet  Commonly known as:  ZYLOPRIM  Take 1 tablet (300 mg total) by mouth daily.     amLODipine 10 MG tablet  Commonly known as:  NORVASC  Take 10 mg by mouth daily.     aspirin EC 81 MG tablet  Take 81 mg by mouth daily.     b complex vitamins tablet  Take 1 tablet by mouth daily.     carbidopa-levodopa 25-100 MG per tablet  Commonly known as:  SINEMET IR  Take 0.5 tablets by mouth 3 (three) times daily.     cholecalciferol 1000 UNITS tablet  Commonly known as:  VITAMIN D  Take 2,000 Units by mouth daily.     desonide 0.05 % cream  Commonly known as:  DESOWEN   Apply 6.06 application topically daily.     dextromethorphan 30 MG/5ML liquid  Commonly known as:  DELSYM  Take 2.5 mLs (15 mg total) by mouth 2 (two) times daily. For cough     furosemide 20 MG tablet  Commonly known as:  LASIX  Take 1 tablet (20 mg total) by mouth daily.     levothyroxine 75 MCG tablet  Commonly known as:  SYNTHROID, LEVOTHROID  Take 75 mcg by mouth daily before breakfast.     loratadine 10 MG tablet  Commonly known as:  CLARITIN  Take 10 mg by mouth daily as needed.     montelukast 10 MG tablet  Commonly known as:  SINGULAIR  Take 10 mg by mouth at bedtime. As needed     naproxen 500 MG tablet  Commonly known as:  NAPROSYN  Take 1 tablet (500 mg total) by mouth 2 (two) times daily with a meal. X 10days     omeprazole 40 MG capsule  Commonly known as:  PRILOSEC  Take 1 capsule (40 mg total) by mouth daily.     predniSONE 10 MG tablet  Commonly known as:  DELTASONE  Prednisone dosing: Take prednisone 60mg  (6 tabs) x 1day, then taper 40mg  (  4 tabs) x 3 days, then taper to 30mg  (3 tabs) x 3 days, then 20mg  (2 tabs) x 3days, then 10mg  (1 tab) x 3days, then OFF.  Start taking on:  09/28/2013     simvastatin 20 MG tablet  Commonly known as:  ZOCOR  Take 20 mg by mouth every evening.     sitaGLIPtin 50 MG tablet  Commonly known as:  JANUVIA  Take 50 mg by mouth daily.     traMADol 50 MG tablet  Commonly known as:  ULTRAM  Take 1 tablet (50 mg total) by mouth every 6 (six) hours as needed for moderate pain or severe pain.     triamcinolone cream 0.1 %  Commonly known as:  KENALOG  Apply 0.1 application topically daily.         Brief H and P: For complete details please refer to admission H and P, but in brief Joshua Glass is a 80 y.o. male with PMH of Gait disorder ( Parkinsonism vs Progressive supranuclear palsy), DM, HTN, Hypothyroidism, mild memory problems presents from home with the above complaints. Pt is a very poor historian, his wife  reported ongoing cough off and on for the past few months that worsened in the last few days, it is mostly nonproductive.  On the day of admission he was profoundly much weaker than usual and was unable to get around. He was also much sleepier and ill appearing and hence his wife brought him to the ER. In ER, Temp of 102.5, tachypneic, tachycardic, improving now, WBC elevated at [facility] Course:  Fevers with Acute bronchitis : Improving afebrile, Influenza PCR is negative, repeat CXR showed no pneumonia. Blood cultures negative to date, urine culture negative. Infectious disease was consulted and there was initially concern for septic arthritis in the left shoulder joint, patient remained on vancomycin and cefepime IV which was stopped on 09/26/13 by infectious disease. Joint aspirate showed monosodium crystals and no organism or any infection.   Left shoulder pain/ Gout  -Patient reported he had a fall one year ago where he had injured his head and left shoulder but he has been unable to move his left shoulder for last 3-4 days prior to admission. On exam he had significant pain and decrease in the range of movement. MRI of the left shoulder was obtained which showed abnormal joint effusion with enhancing margins favoring synovitis or septic joint, chronically ruptured supraspinatus and infraspinatus tendons. Orthopedics was consulted. He was seen by Dr. Wylene Glass and fluoroscopy guided aspiration of the left shoulder joint was accomplished which revealed positive crystal exam and the cell count of 1000 consistent with gout of the left shoulder. Per orthopedics, no indication for surgery at this time. Initially he was continued on IV vancomycin and cefepime till 3/8 until the diagnosis of acute gout was confirmed. Patient was given one dose of IV Solu Medrol and placed on oral prednisone taper. Patient is also placed on allopurinol and naproxen for 10 days. Uric acid is still pending. Please  follow BMET for renal function.   Urinary retention  Foley catheter was placed during admission, discontinued on 3/9. Flomax was added.   Dyspnea with Pulmonary edema-  significantly improved,  overnight on 3/6 had developed pulmonary edema, and required IV lasix x 1, IV fluids have been discontinued. BNP 376. Currently on oral Lasix.  2-D echo showed EF of 01-02%, grade 1 diastolic dysfunction.   Parkinson's disease- Dose of Sinemet was  increased recently by neurologist, which could have caused the weakness. Dr Sharl Ma  discussed with neurology, Felicie Morn PA has seen the patient and recommend to keep the patient on Sinemet 0.5 tab po TID, and d/c home at this dose . Can follow up with neuro as outpatient with Dr Hosie Poisson, he has appointment.   Dysphagia- continue with Dys 2 diet   Diabetes mellitus-  he was continued on sliding scale insulin while inpatient   Hypertension-  stable     Day of Discharge BP 154/64  Pulse 77  Temp(Src) 97.8 F (36.6 C) (Oral)  Resp 18  Ht 5' 6.14" (1.68 m)  Wt 108.863 kg (240 lb)  BMI 38.57 kg/m2  SpO2 95%  Physical Exam: Gen.: Alert and awake, oriented x3 NAD CVS: S1-S2 clear, no murmur rubs or gallops  Chest: mild scattered wheezing, no rales or rhonchi  Abdomen: soft nontender, nondistended, normal bowel sounds  Extremities: no cyanosis, clubbing or edema noted bilaterally. Left arm/ shoulder ROM is improving Neuro: Cranial nerves II-XII intact, no focal neurological deficits   The results of significant diagnostics from this hospitalization (including imaging, microbiology, ancillary and laboratory) are listed below for reference.    LAB RESULTS: Basic Metabolic Panel:  Recent Labs Lab 09/23/13 2015 09/25/13 0510  NA 137 139  K 3.3* 3.5*  CL 99 101  CO2 23 23  GLUCOSE 181* 137*  BUN 11 13  CREATININE 0.98 1.06  CALCIUM 8.5 8.9   Liver Function Tests:  Recent Labs Lab 09/22/13 2104  AST 29  ALT 8  ALKPHOS 94  BILITOT 1.0   PROT 7.4  ALBUMIN 3.3*   No results found for this basename: LIPASE, AMYLASE,  in the last 168 hours No results found for this basename: AMMONIA,  in the last 168 hours CBC:  Recent Labs Lab 09/22/13 2104 09/23/13 1100 09/25/13 0510  WBC 15.0* 11.0* 12.4*  NEUTROABS 10.5*  --   --   HGB 13.8 12.6* 13.5  HCT 40.4 37.0* 39.8  MCV 81.9 82.4 82.6  PLT 254 224 267   Cardiac Enzymes: No results found for this basename: CKTOTAL, CKMB, CKMBINDEX, TROPONINI,  in the last 168 hours BNP: No components found with this basename: POCBNP,  CBG:  Recent Labs Lab 09/26/13 2128 09/27/13 0813  GLUCAP 169* 211*    Significant Diagnostic Studies:  Dg Chest Port 1 View  09/23/2013   CLINICAL DATA:  Shortness of breath.  EXAM: PORTABLE CHEST - 1 VIEW  COMPARISON:  DG CHEST 1V PORT dated 09/22/2013  FINDINGS: Cardiomediastinal silhouette is unremarkable for this low inspiratory portable examination with crowded vasculature markings. The lungs are clear without pleural effusions or focal consolidations. Trachea projects midline and there is no pneumothorax. Included soft tissue planes and osseous structures are non-suspicious. Multiple EKG lines overlie the patient and may obscure subtle underlying pathology.  IMPRESSION: No acute cardiopulmonary process for this low inspiratory portable examination.   Electronically Signed   By: Awilda Metro   On: 09/23/2013 19:54   Dg Chest Port 1 View  09/22/2013   CLINICAL DATA:  Fever and arm pain.  EXAM: PORTABLE CHEST - 1 VIEW  COMPARISON:  01/28/2013  FINDINGS: No cardiomegaly for technique. Widened appearance of the upper mediastinum is likely related to semi recumbent positioning. Given low lung volumes, there is no definitive edema, consolidation, effusion, or pneumothorax.  IMPRESSION: Low lung volumes without definite edema or consolidation.   Electronically Signed   By: Audry Riles.D.  On: 09/22/2013 21:28    2D ECHO: Study Conclusions  -  Left ventricle: The cavity size was normal. There was moderate concentric hypertrophy. Systolic function was normal. The estimated ejection fraction was in the range of 60% to 65%. Wall motion was normal; there were no regional wall motion abnormalities. Doppler parameters are consistent with abnormal left ventricular relaxation (grade 1 diastolic dysfunction). The E/e' ratio is ~10, suggesting borderline elevated LV filling pressure. - Aortic valve: Trileaflet; mildly calcified leaflets. There was no stenosis. No regurgitation. - Mitral valve: Mildly thickened leaflets . Mild regurgitation. - Left atrium: The atrium was mildly dilated (30 ml/m2). - Right atrium: The atrium was mildly dilated (20 cm2). - Tricuspid valve: Mild regurgitation. - Pulmonary arteries: PA peak pressure: 35mm Hg (S). - Inferior vena cava: The vessel was normal in size; the respirophasic diameter changes were in the normal range (= 50%); findings are consistent with normal central venous pressure. - Pericardium, extracardiac: There was no pericardial effusion.    Disposition and Follow-up:     Discharge Orders   Future Appointments Provider Department Dept Phone   11/24/2013 11:30 AM Hulen Luster, [place] [facility] [phone]   05/18/2014 9:00 AM Deneise Lever, MD [facility] [phone]   Future Orders Complete By Expires   Diet Carb Modified  As directed    Increase activity slowly  As directed        DISPOSITION:  home DIET: Carb modified   TESTS THAT NEED FOLLOW-UP  BMET   DISCHARGE FOLLOW-UP Follow-up Information   Follow up with [facility] ([facility])    Contact information:   [address] [place] [address] [phone]       Follow up with Joshua Simmer, MD. Schedule an appointment as soon as possible for a visit in 2 weeks. (for hospital follow-up)    Specialty:  Orthopedic Surgery    Contact information:   [address] [place] [address] [phone]       Follow up with Myriam Jacobson, MD. Schedule an appointment as soon as possible for a visit in 2 weeks. (for hospital follow-up)    Specialty:  Internal Medicine   Contact information:   [place], [place] [address] [phone]       Time spent on Discharge:  40 minutes   Signed:   RAI,RIPUDEEP M.D. [facility] 09/27/2013, 10:34 AM Pager: [phone]

## 2013-09-27 NOTE — Progress Notes (Signed)
Physical Therapy Treatment Patient Details Name: Joshua Glass MRN: [id number] DOB: 1933/01/05 Today's Date: 09/27/2013 Time: 6283-1517 PT Time Calculation (min): 39 min  PT Assessment / Plan / Recommendation  History of Present Illness 80 y.o. male admitted to [facility] on 09/22[facility]13 with PMH of Gait disorder ( Parkinsonism vs Progressive supranuclear palsy), DM, HTN, Hypothyroidism, mild memory problems presents from home with the above complaints. CXR showed no pneumonia, last night he became short of breath due to fluid overload and required IV lasix 20 mg x 1. IV fluids were discontinued. Today he is breathing better, still having fever, without obvious source of infection. He has pain in the left shoulder, and unable to move the left shoulder.  MRI revealed possible septic lt shoulder joint. Pt for aspiration of lt shoulder and possible arthoscopy.   PT Comments   Pt con't to progress from ambulation tolerance stand point however con't to require maxA for bed mobility and modA for all other transfers. Wife aware and reports "Joshua Glass been having to help him for a while now." Expressed concern to spouse regarding pt's level of assist. Pt spouse heard concern however desires to take pt home and "give it a try." Concern for pt's increased risk of falling and educated spouse on how to assist pt properly to decrease risk of injury to spouse.  Follow Up Recommendations  Home health PT;Supervision for mobility/OOB     Does the patient have the potential to tolerate intense rehabilitation     Barriers to Discharge        Equipment Recommendations  None recommended by PT    Recommendations for Other Services    Frequency Min 3X/week   Progress towards PT Goals Progress towards PT goals: Progressing toward goals  Plan Current plan remains appropriate    Precautions / Restrictions Precautions Precautions: Fall Restrictions Weight Bearing Restrictions: No   Pertinent Vitals/Pain Denies pain     Mobility  Bed Mobility Overal bed mobility: Needs Assistance Bed Mobility: Supine to Sit Supine to sit: Max assist General bed mobility comments: assist with trunk elevation and to bring hips to EOB Transfers Overall transfer level: Needs assistance Equipment used: Rolling walker (2 wheeled) Transfers: Sit to/from Stand Sit to Stand: Mod assist General transfer comment: pt with posterior bias requiring max directional v/c's to lean forward and for safe hand placement, assist to bring hips into extension Ambulation/Gait Ambulation/Gait assistance: Min assist Ambulation Distance (Feet): 200 Feet Assistive device: Rolling walker (2 wheeled) Gait Pattern/deviations: Step-through pattern;Trunk flexed;Festinating Gait velocity: pt with progressive speed requiring max directional v/cs to slow down or stop. Pt able to sense increase speed however unable to slow down. Pt at increased risk of falling forward during ambulation General Gait Details: significant trunk flexion requiring tactile cues at shoulder to bring trunk into extension. min/modA required for safe walker management    Exercises     PT Diagnosis:    PT Problem List:   PT Treatment Interventions:     PT Goals (current goals can now be found in the care plan section)    Visit Information  Last PT Received On: 09/27/13 Assistance Needed: +1 History of Present Illness: 80 y.o. male admitted to [facility] on 09/22/13 with PMH of Gait disorder ( Parkinsonism vs Progressive supranuclear palsy), DM, HTN, Hypothyroidism, mild memory problems presents from home with the above complaints. CXR showed no pneumonia, last night he became short of breath due to fluid overload and required IV lasix 20 mg x 1. IV fluids  were discontinued. Today he is breathing better, still having fever, without obvious source of infection. He has pain in the left shoulder, and unable to move the left shoulder.  MRI revealed possible septic lt shoulder joint. Pt for  aspiration of lt shoulder and possible arthoscopy.    Subjective Data  Subjective: with reports assisting with all transfers for a long time now   Cognition  Cognition Arousal/Alertness: Awake/alert Behavior During Therapy: WFL for tasks assessed/performed Overall Cognitive Status: Within Functional Limits for tasks assessed    Balance  Balance Overall balance assessment: Needs assistance Sitting-balance support: Single extremity supported Sitting balance-Leahy Scale: Poor Postural control: Posterior lean Standing balance support: Single extremity supported Standing balance-Leahy Scale: Poor Standing balance comment: requires use of RW for safe standing General Comments General comments (skin integrity, edema, etc.): Assist pt to commode. pt dependent for hygiene and with stool incontinence.   End of Session PT - End of Session Equipment Utilized During Treatment: Gait belt Activity Tolerance: Patient tolerated treatment well Patient left: in chair;with call bell/phone within reach;with family/visitor present Nurse Communication: Mobility status   GP     [name] 09/27/2013, 9:47 AM  [name], PT, DPT Pager #: [phone] Office #: [phone]

## 2013-09-27 NOTE — Care Management Note (Signed)
    Page 1 of 1   09/27/2013     10:12:42 AM   CARE MANAGEMENT NOTE 09/27/2013  Patient:  Joshua Glass, Joshua Glass   Account Number:  [id number]  Date Initiated:  09/27/2013  Documentation initiated by:  GRAVES-BIGELOW,Shanara Schnieders  Subjective/Objective Assessment:   Pt admitted for sepsis. Plan for home with wife. [facility]     Action/Plan:   CM did make referral with [facility] for PT/OT services. SOC to begin within 24- 48 hrs post d/c.   Anticipated DC Date:  09/27/2013   Anticipated DC Plan:  [place]  CM consult      [facility] Choice  HOME HEALTH   Choice offered to / List presented to:  C-3 Spouse        HH arranged  [place].   Status of service:  Completed, signed off Medicare Important Message given?   (If response is "NO", the following Medicare IM given date fields will be blank) Date Medicare IM given:   Date Additional Medicare IM given:    Discharge Disposition:  [place]  Per UR Regulation:  Reviewed for med. necessity/level of care/duration of stay  If discussed at [place] of Stay Meetings, dates discussed:    Comments:

## 2013-09-29 LAB — BODY FLUID CULTURE: Culture: NO GROWTH

## 2013-09-29 LAB — CULTURE, BLOOD (ROUTINE X 2)
CULTURE: NO GROWTH
Culture: NO GROWTH

## 2013-09-30 LAB — ANAEROBIC CULTURE

## 2013-10-07 ENCOUNTER — Emergency Department (HOSPITAL_COMMUNITY): Payer: Medicare Other | Admitting: Anesthesiology

## 2013-10-07 ENCOUNTER — Encounter (HOSPITAL_COMMUNITY): Payer: Medicare Other | Admitting: Anesthesiology

## 2013-10-07 ENCOUNTER — Inpatient Hospital Stay (HOSPITAL_COMMUNITY)
Admission: EM | Admit: 2013-10-07 | Discharge: 2013-10-13 | DRG: 026 | Disposition: A | Discharge status: Rehabilitation Facility | Payer: Medicare Other | Attending: Neurosurgery | Admitting: Neurosurgery

## 2013-10-07 ENCOUNTER — Encounter (HOSPITAL_COMMUNITY): Payer: Self-pay | Admitting: Emergency Medicine

## 2013-10-07 ENCOUNTER — Emergency Department (HOSPITAL_COMMUNITY): Payer: Medicare Other

## 2013-10-07 ENCOUNTER — Encounter (HOSPITAL_COMMUNITY)
Admission: EM | Disposition: A | Discharge status: Rehabilitation Facility | Payer: Self-pay | Source: Home / Self Care | Attending: Neurosurgery

## 2013-10-07 DIAGNOSIS — I4891 Unspecified atrial fibrillation: Secondary | ICD-10-CM | POA: Diagnosis present

## 2013-10-07 DIAGNOSIS — S065XAA Traumatic subdural hemorrhage with loss of consciousness status unknown, initial encounter: Principal | ICD-10-CM | POA: Diagnosis present

## 2013-10-07 DIAGNOSIS — E871 Hypo-osmolality and hyponatremia: Secondary | ICD-10-CM | POA: Diagnosis not present

## 2013-10-07 DIAGNOSIS — Z96659 Presence of unspecified artificial knee joint: Secondary | ICD-10-CM

## 2013-10-07 DIAGNOSIS — Z833 Family history of diabetes mellitus: Secondary | ICD-10-CM

## 2013-10-07 DIAGNOSIS — E1049 Type 1 diabetes mellitus with other diabetic neurological complication: Secondary | ICD-10-CM | POA: Diagnosis present

## 2013-10-07 DIAGNOSIS — I10 Essential (primary) hypertension: Secondary | ICD-10-CM

## 2013-10-07 DIAGNOSIS — M109 Gout, unspecified: Secondary | ICD-10-CM | POA: Diagnosis present

## 2013-10-07 DIAGNOSIS — Z7982 Long term (current) use of aspirin: Secondary | ICD-10-CM

## 2013-10-07 DIAGNOSIS — R1310 Dysphagia, unspecified: Secondary | ICD-10-CM

## 2013-10-07 DIAGNOSIS — G4733 Obstructive sleep apnea (adult) (pediatric): Secondary | ICD-10-CM | POA: Diagnosis present

## 2013-10-07 DIAGNOSIS — Z79899 Other long term (current) drug therapy: Secondary | ICD-10-CM

## 2013-10-07 DIAGNOSIS — S065X9A Traumatic subdural hemorrhage with loss of consciousness of unspecified duration, initial encounter: Principal | ICD-10-CM | POA: Diagnosis present

## 2013-10-07 DIAGNOSIS — Z87891 Personal history of nicotine dependence: Secondary | ICD-10-CM

## 2013-10-07 DIAGNOSIS — R4789 Other speech disturbances: Secondary | ICD-10-CM | POA: Diagnosis present

## 2013-10-07 DIAGNOSIS — E669 Obesity, unspecified: Secondary | ICD-10-CM | POA: Diagnosis present

## 2013-10-07 DIAGNOSIS — E108 Type 1 diabetes mellitus with unspecified complications: Secondary | ICD-10-CM

## 2013-10-07 DIAGNOSIS — R471 Dysarthria and anarthria: Secondary | ICD-10-CM | POA: Diagnosis present

## 2013-10-07 DIAGNOSIS — E1365 Other specified diabetes mellitus with hyperglycemia: Secondary | ICD-10-CM

## 2013-10-07 DIAGNOSIS — Z8249 Family history of ischemic heart disease and other diseases of the circulatory system: Secondary | ICD-10-CM

## 2013-10-07 DIAGNOSIS — R4701 Aphasia: Secondary | ICD-10-CM | POA: Diagnosis present

## 2013-10-07 DIAGNOSIS — E1065 Type 1 diabetes mellitus with hyperglycemia: Secondary | ICD-10-CM | POA: Diagnosis present

## 2013-10-07 DIAGNOSIS — Z823 Family history of stroke: Secondary | ICD-10-CM

## 2013-10-07 DIAGNOSIS — W19XXXA Unspecified fall, initial encounter: Secondary | ICD-10-CM | POA: Diagnosis present

## 2013-10-07 DIAGNOSIS — G20 Parkinson's disease: Secondary | ICD-10-CM

## 2013-10-07 DIAGNOSIS — I6200 Nontraumatic subdural hemorrhage, unspecified: Secondary | ICD-10-CM

## 2013-10-07 DIAGNOSIS — R269 Unspecified abnormalities of gait and mobility: Secondary | ICD-10-CM

## 2013-10-07 DIAGNOSIS — Z9849 Cataract extraction status, unspecified eye: Secondary | ICD-10-CM

## 2013-10-07 DIAGNOSIS — G20A1 Parkinson's disease without dyskinesia, without mention of fluctuations: Secondary | ICD-10-CM | POA: Diagnosis present

## 2013-10-07 DIAGNOSIS — Y92009 Unspecified place in unspecified non-institutional (private) residence as the place of occurrence of the external cause: Secondary | ICD-10-CM

## 2013-10-07 DIAGNOSIS — R569 Unspecified convulsions: Secondary | ICD-10-CM | POA: Diagnosis present

## 2013-10-07 DIAGNOSIS — IMO0002 Reserved for concepts with insufficient information to code with codable children: Secondary | ICD-10-CM

## 2013-10-07 DIAGNOSIS — E1349 Other specified diabetes mellitus with other diabetic neurological complication: Secondary | ICD-10-CM

## 2013-10-07 DIAGNOSIS — E119 Type 2 diabetes mellitus without complications: Secondary | ICD-10-CM

## 2013-10-07 DIAGNOSIS — E039 Hypothyroidism, unspecified: Secondary | ICD-10-CM | POA: Diagnosis present

## 2013-10-07 DIAGNOSIS — E1142 Type 2 diabetes mellitus with diabetic polyneuropathy: Secondary | ICD-10-CM | POA: Diagnosis present

## 2013-10-07 HISTORY — PX: CRANIOTOMY: SHX93

## 2013-10-07 LAB — I-STAT CHEM 8, ED
BUN: 22 mg/dL (ref 6–23)
Calcium, Ion: 1.15 mmol/L (ref 1.13–1.30)
Chloride: 98 meq/L (ref 96–112)
Creatinine, Ser: 0.9 mg/dL (ref 0.50–1.35)
Glucose, Bld: 423 mg/dL — ABNORMAL HIGH (ref 70–99)
HCT: 46 % (ref 39.0–52.0)
Hemoglobin: 15.6 g/dL (ref 13.0–17.0)
Potassium: 4.1 mEq/L (ref 3.7–5.3)
Sodium: 135 mEq/L — ABNORMAL LOW (ref 137–147)
TCO2: 27 mmol/L (ref 0–100)

## 2013-10-07 LAB — CBC
HCT: 42 % (ref 39.0–52.0)
Hemoglobin: 14.9 g/dL (ref 13.0–17.0)
MCH: 29 pg (ref 26.0–34.0)
MCHC: 35.5 g/dL (ref 30.0–36.0)
MCV: 81.9 fL (ref 78.0–100.0)
Platelets: 342 10*3/uL (ref 150–400)
RBC: 5.13 MIL/uL (ref 4.22–5.81)
RDW: 15 % (ref 11.5–15.5)
WBC: 18.3 K/uL — ABNORMAL HIGH (ref 4.0–10.5)

## 2013-10-07 LAB — ETHANOL: Alcohol, Ethyl (B): <11 mg/dL (ref 0–11)

## 2013-10-07 LAB — COMPREHENSIVE METABOLIC PANEL WITH GFR
ALT: 37 U/L (ref 0–53)
Alkaline Phosphatase: 105 U/L (ref 39–117)
BUN: 21 mg/dL (ref 6–23)
CO2: 25 meq/L (ref 19–32)
GFR calc Af Amer: >90 mL/min (ref >90)
GFR calc non Af Amer: 81 mL/min — ABNORMAL LOW (ref >90)
Glucose, Bld: 429 mg/dL — ABNORMAL HIGH (ref 70–99)
Potassium: 4.1 meq/L (ref 3.7–5.3)
Sodium: 134 meq/L — ABNORMAL LOW (ref 137–147)
Total Bilirubin: 0.9 mg/dL (ref 0.3–1.2)
Total Protein: 7.3 g/dL (ref 6.0–8.3)

## 2013-10-07 LAB — DIFFERENTIAL
Basophils Absolute: 0 10*3/uL (ref 0.0–0.1)
Basophils Relative: 0 % (ref 0–1)
Eosinophils Absolute: 0 10*3/uL (ref 0.0–0.7)
Eosinophils Relative: 0 % (ref 0–5)
Lymphocytes Relative: 7 % — ABNORMAL LOW (ref 12–46)
Lymphs Abs: 1.3 K/uL (ref 0.7–4.0)
Monocytes Absolute: 0.7 10*3/uL (ref 0.1–1.0)
Monocytes Relative: 4 % (ref 3–12)
Neutro Abs: 16.3 10*3/uL — ABNORMAL HIGH (ref 1.7–7.7)
Neutrophils Relative %: 89 % — ABNORMAL HIGH (ref 43–77)

## 2013-10-07 LAB — COMPREHENSIVE METABOLIC PANEL
AST: 32 U/L (ref 0–37)
Albumin: 3.6 g/dL (ref 3.5–5.2)
Calcium: 9.2 mg/dL (ref 8.4–10.5)
Chloride: 93 mEq/L — ABNORMAL LOW (ref 96–112)
Creatinine, Ser: 0.87 mg/dL (ref 0.50–1.35)

## 2013-10-07 LAB — I-STAT TROPONIN, ED: Troponin i, poc: 0.01 ng/mL (ref 0.00–0.08)

## 2013-10-07 LAB — PROTIME-INR
INR: 1 (ref 0.00–1.49)
Prothrombin Time: 13 s (ref 11.6–15.2)

## 2013-10-07 LAB — APTT: aPTT: 27 seconds (ref 24–37)

## 2013-10-07 SURGERY — CRANIOTOMY HEMATOMA EVACUATION SUBDURAL
Anesthesia: General | Site: Head | Laterality: Left

## 2013-10-07 MED ORDER — GLYCOPYRROLATE 0.2 MG/ML IJ SOLN
INTRAMUSCULAR | Status: DC | PRN
  Administered 2013-10-07: 0.6 mg via INTRAVENOUS

## 2013-10-07 MED ORDER — SODIUM CHLORIDE 0.9 % IV SOLN
0.0500 ug/kg/min | INTRAVENOUS | Status: DC
  Administered 2013-10-07: .2 ug/kg/min via INTRAVENOUS
  Filled 2013-10-07 (×9): qty 2000

## 2013-10-07 MED ORDER — PHENYLEPHRINE 40 MCG/ML (10ML) SYRINGE FOR IV PUSH (FOR BLOOD PRESSURE SUPPORT)
PREFILLED_SYRINGE | INTRAVENOUS | Status: AC
  Filled 2013-10-07: qty 10

## 2013-10-07 MED ORDER — LIDOCAINE-EPINEPHRINE 1 %-1:100000 IJ SOLN
INTRAMUSCULAR | Status: DC | PRN
  Administered 2013-10-07: 5 mL via INTRADERMAL

## 2013-10-07 MED ORDER — POTASSIUM CHLORIDE IN NACL 20-0.9 MEQ/L-% IV SOLN
INTRAVENOUS | Status: DC
  Administered 2013-10-08 – 2013-10-09 (×3): via INTRAVENOUS
  Filled 2013-10-07 (×5): qty 1000

## 2013-10-07 MED ORDER — NEOSTIGMINE METHYLSULFATE 1 MG/ML IJ SOLN
INTRAMUSCULAR | Status: AC
Start: 2013-10-07 — End: 2013-10-07
  Filled 2013-10-07: qty 10

## 2013-10-07 MED ORDER — SUCCINYLCHOLINE CHLORIDE 20 MG/ML IJ SOLN
INTRAMUSCULAR | Status: DC | PRN
  Administered 2013-10-07: 100 mg via INTRAVENOUS

## 2013-10-07 MED ORDER — CEFAZOLIN SODIUM-DEXTROSE 2-3 GM-% IV SOLR
INTRAVENOUS | Status: DC | PRN
  Administered 2013-10-07: 2 g via INTRAVENOUS

## 2013-10-07 MED ORDER — LABETALOL HCL 5 MG/ML IV SOLN
INTRAVENOUS | Status: DC | PRN
  Administered 2013-10-07: 10 mg via INTRAVENOUS
  Administered 2013-10-07: 2.5 mg via INTRAVENOUS
  Administered 2013-10-07: 5 mg via INTRAVENOUS
  Administered 2013-10-07: 10 mg via INTRAVENOUS
  Administered 2013-10-07: 5 mg via INTRAVENOUS
  Administered 2013-10-07: 2.5 mg via INTRAVENOUS
  Administered 2013-10-07: 5 mg via INTRAVENOUS

## 2013-10-07 MED ORDER — FENTANYL CITRATE 0.05 MG/ML IJ SOLN
INTRAMUSCULAR | Status: AC
  Filled 2013-10-07: qty 5

## 2013-10-07 MED ORDER — PROPOFOL 10 MG/ML IV BOLUS
INTRAVENOUS | Status: DC | PRN
  Administered 2013-10-07: 120 mg via INTRAVENOUS

## 2013-10-07 MED ORDER — SODIUM CHLORIDE 0.9 % IV SOLN
INTRAVENOUS | Status: DC | PRN
  Administered 2013-10-07: 22:00:00 via INTRAVENOUS

## 2013-10-07 MED ORDER — REMIFENTANIL BOLUS VIA INFUSION OPTIME
INTRAVENOUS | Status: DC | PRN
  Administered 2013-10-07: 20 ug via INTRAVENOUS

## 2013-10-07 MED ORDER — VECURONIUM BROMIDE 10 MG IV SOLR
INTRAVENOUS | Status: DC | PRN
  Administered 2013-10-07: 3 mg via INTRAVENOUS

## 2013-10-07 MED ORDER — CEFAZOLIN SODIUM-DEXTROSE 2-3 GM-% IV SOLR
INTRAVENOUS | Status: AC
  Filled 2013-10-07: qty 50

## 2013-10-07 MED ORDER — LIDOCAINE HCL (CARDIAC) 20 MG/ML IV SOLN
INTRAVENOUS | Status: DC | PRN
  Administered 2013-10-07: 100 mg via INTRAVENOUS

## 2013-10-07 MED ORDER — LIDOCAINE HCL (CARDIAC) 20 MG/ML IV SOLN
INTRAVENOUS | Status: AC
Start: 2013-10-07 — End: 2013-10-07
  Filled 2013-10-07: qty 5

## 2013-10-07 MED ORDER — PROPOFOL 10 MG/ML IV BOLUS
INTRAVENOUS | Status: AC
  Filled 2013-10-07: qty 20

## 2013-10-07 MED ORDER — ONDANSETRON HCL 4 MG/2ML IJ SOLN
INTRAMUSCULAR | Status: DC | PRN
  Administered 2013-10-07: 4 mg via INTRAVENOUS

## 2013-10-07 MED ORDER — SUCCINYLCHOLINE CHLORIDE 20 MG/ML IJ SOLN
INTRAMUSCULAR | Status: AC
  Filled 2013-10-07: qty 1

## 2013-10-07 MED ORDER — ARTIFICIAL TEARS OP OINT
TOPICAL_OINTMENT | OPHTHALMIC | Status: AC
  Filled 2013-10-07: qty 3.5

## 2013-10-07 MED ORDER — ARTIFICIAL TEARS OP OINT
TOPICAL_OINTMENT | OPHTHALMIC | Status: DC | PRN
  Administered 2013-10-07: 1 via OPHTHALMIC

## 2013-10-07 MED ORDER — BUPIVACAINE HCL (PF) 0.25 % IJ SOLN
INTRAMUSCULAR | Status: DC | PRN
  Administered 2013-10-07: 5 mL

## 2013-10-07 MED ORDER — NEOSTIGMINE METHYLSULFATE 1 MG/ML IJ SOLN
INTRAMUSCULAR | Status: DC | PRN
  Administered 2013-10-07: 4 mg via INTRAVENOUS

## 2013-10-07 MED ORDER — THROMBIN 20000 UNITS EX KIT
PACK | CUTANEOUS | Status: DC | PRN
  Administered 2013-10-07: 23:00:00 via TOPICAL

## 2013-10-07 MED ORDER — MANNITOL 25 % IV SOLN
25.0000 g | INTRAVENOUS | Status: AC
  Filled 2013-10-07 (×4): qty 100

## 2013-10-07 MED ORDER — GLYCOPYRROLATE 0.2 MG/ML IJ SOLN
INTRAMUSCULAR | Status: AC
  Filled 2013-10-07: qty 3

## 2013-10-07 MED ORDER — 0.9 % SODIUM CHLORIDE (POUR BTL) OPTIME
TOPICAL | Status: DC | PRN
  Administered 2013-10-07 (×2): 1000 mL

## 2013-10-07 MED ORDER — SODIUM CHLORIDE 0.9 % IV SOLN
Freq: Once | INTRAVENOUS | Status: AC
  Administered 2013-10-07: 21:00:00 via INTRAVENOUS

## 2013-10-07 SURGICAL SUPPLY — 80 items
BANDAGE GAUZE 4  KLING STR (GAUZE/BANDAGES/DRESSINGS) IMPLANT
BANDAGE GAUZE ELAST BULKY 4 IN (GAUZE/BANDAGES/DRESSINGS) IMPLANT
BENZOIN TINCTURE PRP APPL 2/3 (GAUZE/BANDAGES/DRESSINGS) IMPLANT
BIT DRILL WIRE PASS 1.3MM (BIT) IMPLANT
BRUSH SCRUB EZ 1% IODOPHOR (MISCELLANEOUS) ×3 IMPLANT
BRUSH SCRUB EZ PLAIN DRY (MISCELLANEOUS) ×3 IMPLANT
BUR ACORN 6.0 PRECISION (BURR) ×2 IMPLANT
BUR ACORN 6.0MM PRECISION (BURR) ×1
BUR ROUTER D-58 CRANI (BURR) IMPLANT
CANISTER SUCT 3000ML (MISCELLANEOUS) ×3 IMPLANT
CATH ROBINSON RED A/P 12FR (CATHETERS) IMPLANT
CLIP TI MEDIUM 6 (CLIP) IMPLANT
CONT SPEC 4OZ CLIKSEAL STRL BL (MISCELLANEOUS) ×3 IMPLANT
CORDS BIPOLAR (ELECTRODE) ×3 IMPLANT
DRAIN PENROSE 1/2X12 LTX STRL (WOUND CARE) IMPLANT
DRAIN SNY WOU 7FLT (WOUND CARE) IMPLANT
DRAPE NEUROLOGICAL W/INCISE (DRAPES) ×3 IMPLANT
DRAPE SURG IRRIG POUCH 19X23 (DRAPES) IMPLANT
DRAPE WARM FLUID 44X44 (DRAPE) ×3 IMPLANT
DRESSING TELFA 8X3 (GAUZE/BANDAGES/DRESSINGS) ×3 IMPLANT
DRILL WIRE PASS 1.3MM (BIT)
DRSG OPSITE 4X5.5 SM (GAUZE/BANDAGES/DRESSINGS) IMPLANT
DRSG OPSITE POSTOP 4X6 (GAUZE/BANDAGES/DRESSINGS) ×9 IMPLANT
DURAMATRIX ONLAY 3X3 (Plate) ×3 IMPLANT
DURAPREP 6ML APPLICATOR 50/CS (WOUND CARE) ×3 IMPLANT
ELECT CAUTERY BLADE 6.4 (BLADE) ×3 IMPLANT
ELECT REM PT RETURN 9FT ADLT (ELECTROSURGICAL) ×3
ELECTRODE REM PT RTRN 9FT ADLT (ELECTROSURGICAL) ×1 IMPLANT
EVACUATOR SILICONE 100CC (DRAIN) IMPLANT
GAUZE SPONGE 4X4 16PLY XRAY LF (GAUZE/BANDAGES/DRESSINGS) IMPLANT
GLOVE BIO SURGEON STRL SZ 6.5 (GLOVE) ×2 IMPLANT
GLOVE BIO SURGEON STRL SZ7 (GLOVE) ×6 IMPLANT
GLOVE BIO SURGEON STRL SZ8 (GLOVE) ×3 IMPLANT
GLOVE BIO SURGEONS STRL SZ 6.5 (GLOVE) ×1
GLOVE BIOGEL PI IND STRL 8 (GLOVE) IMPLANT
GLOVE BIOGEL PI IND STRL 8.5 (GLOVE) ×1 IMPLANT
GLOVE BIOGEL PI INDICATOR 8 (GLOVE)
GLOVE BIOGEL PI INDICATOR 8.5 (GLOVE) ×2
GLOVE ECLIPSE 7.5 STRL STRAW (GLOVE) IMPLANT
GLOVE EXAM NITRILE LRG STRL (GLOVE) IMPLANT
GLOVE EXAM NITRILE MD LF STRL (GLOVE) IMPLANT
GLOVE EXAM NITRILE XL STR (GLOVE) IMPLANT
GLOVE EXAM NITRILE XS STR PU (GLOVE) IMPLANT
GOWN BRE IMP SLV AUR LG STRL (GOWN DISPOSABLE) IMPLANT
GOWN BRE IMP SLV AUR XL STRL (GOWN DISPOSABLE) IMPLANT
GOWN STRL REIN 2XL LVL4 (GOWN DISPOSABLE) IMPLANT
GOWN STRL REUS W/ TWL LRG LVL4 (GOWN DISPOSABLE) ×2 IMPLANT
GOWN STRL REUS W/TWL LRG LVL4 (GOWN DISPOSABLE) ×4
HEMOSTAT SURGICEL 2X14 (HEMOSTASIS) ×3 IMPLANT
KIT BASIN OR (CUSTOM PROCEDURE TRAY) ×3 IMPLANT
KIT ROOM TURNOVER OR (KITS) ×3 IMPLANT
NEEDLE HYPO 25X1 1.5 SAFETY (NEEDLE) ×3 IMPLANT
NS IRRIG 1000ML POUR BTL (IV SOLUTION) ×3 IMPLANT
PACK CRANIOTOMY (CUSTOM PROCEDURE TRAY) ×3 IMPLANT
PAD ABD 8X10 STRL (GAUZE/BANDAGES/DRESSINGS) IMPLANT
PAD ARMBOARD 7.5X6 YLW CONV (MISCELLANEOUS) ×3 IMPLANT
PATTIES SURGICAL .5 X.5 (GAUZE/BANDAGES/DRESSINGS) IMPLANT
PATTIES SURGICAL .5 X3 (DISPOSABLE) IMPLANT
PATTIES SURGICAL 1X1 (DISPOSABLE) IMPLANT
PIN MAYFIELD SKULL DISP (PIN) IMPLANT
PLATE 1.5/0.5 18.5MM BURR HOLE (Plate) ×9 IMPLANT
RUBBERBAND STERILE (MISCELLANEOUS) ×3 IMPLANT
SCREW SELF DRILL HT 1.5/4MM (Screw) ×36 IMPLANT
SPECIMEN JAR SMALL (MISCELLANEOUS) IMPLANT
SPONGE GAUZE 4X4 12PLY (GAUZE/BANDAGES/DRESSINGS) IMPLANT
SPONGE NEURO XRAY DETECT 1X3 (DISPOSABLE) IMPLANT
SPONGE SURGIFOAM ABS GEL 12-7 (HEMOSTASIS) IMPLANT
STAPLER SKIN PROX WIDE 3.9 (STAPLE) ×3 IMPLANT
SUT ETHILON 3 0 PS 1 (SUTURE) IMPLANT
SUT NURALON 4 0 TR CR/8 (SUTURE) ×6 IMPLANT
SUT VIC AB 2-0 CP2 18 (SUTURE) ×6 IMPLANT
SUT VIC AB 3-0 SH 8-18 (SUTURE) IMPLANT
SYR 20ML ECCENTRIC (SYRINGE) ×3 IMPLANT
SYR CONTROL 10ML LL (SYRINGE) ×3 IMPLANT
TOWEL OR 17X24 6PK STRL BLUE (TOWEL DISPOSABLE) ×3 IMPLANT
TOWEL OR 17X26 10 PK STRL BLUE (TOWEL DISPOSABLE) ×3 IMPLANT
TRAP SPECIMEN MUCOUS 40CC (MISCELLANEOUS) IMPLANT
TRAY FOLEY CATH 14FRSI W/METER (CATHETERS) IMPLANT
UNDERPAD 30X30 INCONTINENT (UNDERPADS AND DIAPERS) IMPLANT
WATER STERILE IRR 1000ML POUR (IV SOLUTION) ×3 IMPLANT
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
## 2013-10-07 NOTE — ED Notes (Signed)
Verbal order from Raquel Sarna, charge RN to take patient to Neuro OR at this time. Transporting at this time. Family updated on plan of care.

## 2013-10-07 NOTE — ED Notes (Signed)
Pt to OR.

## 2013-10-07 NOTE — Op Note (Signed)
10/07/2013  11:40 PM  PATIENT:  Joshua Glass  80 y.o. male  PRE-OPERATIVE DIAGNOSIS:  Acute Left Subdural Hematoma with aphasia  POST-OPERATIVE DIAGNOSIS: Acute Left Subdural Hematoma with aphasia  PROCEDURE:  Procedure(s): CRANIOTOMY HEMATOMA EVACUATION SUBDURAL (Left)  SURGEON:  Surgeon(s) and Role:    * Erline Levine, MD - Primary  PHYSICIAN ASSISTANT:   ASSISTANTS: none   ANESTHESIA:   general  EBL:  Total I/O In: -  Out: 600 [Urine:600]  BLOOD ADMINISTERED:none  DRAINS: (10) Jackson-Pratt drain(s) with closed bulb suction in the subgaleal space   LOCAL MEDICATIONS USED:  LIDOCAINE   SPECIMEN:  No Specimen  DISPOSITION OF SPECIMEN:  N/A  COUNTS:  YES  TOURNIQUET:  * No tourniquets in log *  DICTATION: Patient is 80 year old man who fell and struck his head.  He has developed aphasia and CT shows large acute left pan-hemispheric subdural  hematoma  12 mm in thickness with shift and mass effect.  It was elected to take patient to surgery for craniotomy forSDH.  Procedure:  Following smooth intubation, patient was placed in right semi-lateral position with blanket roll.  Head was placed on donut head holder and left fronto-temporal scalp was shaved and prepped and draped in usual sterile fashion.  Area of planned incision was infiltrated with lidocaine. A curvilinear incision was made and carried through temporalis fascia and muscle to expose calvarium.  Skull flap was elevated exposing subdural hematoma.  Dura was opened and subdural was evacuated.  There were two points of arterial bleeding which were coagulated with bipolar coagulator.  Hemostasis was assured with copious saline irrigation.    Hemostasis was assured.  The brain was considerably more relaxed after hematoma evacuation.  A DUral Matrix graft was placed over exposed brain, bone flap was replaced with plates, the fascia and galea were closed with 2-0 vicryl sutures and the skin was re approximated with  staples.  A 10 JP subgaleal drain was plced. A sterile occlusive dressing was placed.  Patient was extubated in OR having tolerated the surgery well.  PLAN OF CARE: Admit to inpatient   PATIENT DISPOSITION:  PACU - hemodynamically stable.   Delay start of Pharmacological VTE agent (>24hrs) due to surgical blood loss or risk of bleeding: yes

## 2013-10-07 NOTE — Anesthesia Preprocedure Evaluation (Addendum)
Anesthesia Evaluation  Patient identified by MRN, date of birth, ID band Patient awake and Patient confused  General Assessment Comment:History unobtainable from patient given mental state, chart reviewed  Reviewed: Allergy & Precautions, H&P , NPO status , Patient's Chart, lab work & pertinent test results  History of Anesthesia Complications Negative for: history of anesthetic complications  Airway Mallampati: IV TM Distance: >3 FB   Mouth opening: Limited Mouth Opening  Dental   Pulmonary sleep apnea and Continuous Positive Airway Pressure Ventilation , COPDformer smoker,  breath sounds clear to auscultation        Cardiovascular hypertension, Pt. on medications + dysrhythmias Atrial Fibrillation Rhythm:Regular Rate:Normal     Neuro/Psych Fall with subdural hematoma, neurocognitivfe impairment, awake and alert, oriented to self  Neuromuscular disease negative psych ROS   GI/Hepatic hiatal hernia,   Endo/Other  diabetes, Type 2Hypothyroidism   Renal/GU      Musculoskeletal   Abdominal   Peds  Hematology negative hematology ROS (+)   Anesthesia Other Findings   Reproductive/Obstetrics                          Anesthesia Physical Anesthesia Plan  ASA: IV and emergent  Anesthesia Plan: General   Post-op Pain Management:    Induction: Intravenous  Airway Management Planned: Oral ETT  Additional Equipment: Arterial line  Intra-op Plan:   Post-operative Plan: Extubation in OR  Informed Consent: I have reviewed the patients History and Physical, chart, labs and discussed the procedure including the risks, benefits and alternatives for the proposed anesthesia with the patient or authorized representative who has indicated his/her understanding and acceptance.   Dental advisory given  Plan Discussed with: CRNA and Surgeon  Anesthesia Plan Comments:         Anesthesia Quick  Evaluation

## 2013-10-07 NOTE — Brief Op Note (Signed)
10/07/2013  11:40 PM  PATIENT:  Joshua Glass  80 y.o. male  PRE-OPERATIVE DIAGNOSIS:  Acute Left Subdural Hematoma with aphasia  POST-OPERATIVE DIAGNOSIS: Acute Left Subdural Hematoma with aphasia  PROCEDURE:  Procedure(s): CRANIOTOMY HEMATOMA EVACUATION SUBDURAL (Left)  SURGEON:  Surgeon(s) and Role:    * Laylana Gerwig, MD - Primary  PHYSICIAN ASSISTANT:   ASSISTANTS: none   ANESTHESIA:   general  EBL:  Total I/O In: -  Out: 600 [Urine:600]  BLOOD ADMINISTERED:none  DRAINS: (10) Jackson-Pratt drain(s) with closed bulb suction in the subgaleal space   LOCAL MEDICATIONS USED:  LIDOCAINE   SPECIMEN:  No Specimen  DISPOSITION OF SPECIMEN:  N/A  COUNTS:  YES  TOURNIQUET:  * No tourniquets in log *  DICTATION: Patient is 80 year old man who fell and struck his head.  He has developed aphasia and CT shows large acute left pan-hemispheric subdural  hematoma  12 mm in thickness with shift and mass effect.  It was elected to take patient to surgery for craniotomy forSDH.  Procedure:  Following smooth intubation, patient was placed in right semi-lateral position with blanket roll.  Head was placed on donut head holder and left fronto-temporal scalp was shaved and prepped and draped in usual sterile fashion.  Area of planned incision was infiltrated with lidocaine. A curvilinear incision was made and carried through temporalis fascia and muscle to expose calvarium.  Skull flap was elevated exposing subdural hematoma.  Dura was opened and subdural was evacuated.  There were two points of arterial bleeding which were coagulated with bipolar coagulator.  Hemostasis was assured with copious saline irrigation.    Hemostasis was assured.  The brain was considerably more relaxed after hematoma evacuation.  A DUral Matrix graft was placed over exposed brain, bone flap was replaced with plates, the fascia and galea were closed with 2-0 vicryl sutures and the skin was re approximated with  staples.  A 10 JP subgaleal drain was plced. A sterile occlusive dressing was placed.  Patient was extubated in OR having tolerated the surgery well.  PLAN OF CARE: Admit to inpatient   PATIENT DISPOSITION:  PACU - hemodynamically stable.   Delay start of Pharmacological VTE agent (>24hrs) due to surgical blood loss or risk of bleeding: yes  

## 2013-10-07 NOTE — Code Documentation (Signed)
80 yo wm brought in via [facility] for garbled speech.  Per report the pt started with intermittent episodes of garbled speech early this afternoon around 3:15 pm with the last episode happening for the ED RN at 8pm.  The pt had a fall last week and has extensive bruising to the left face & neck.  See doc flowsheets for code stroke times.  NIH 4 for facial asymmetry and dysarthria & sensory impairment to left face.  Code stroke cancelled.

## 2013-10-07 NOTE — ED Notes (Signed)
Speech is clear at this time. Pt A&Ox4. No neuro deficits.
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
## 2013-10-07 NOTE — Anesthesia Procedure Notes (Signed)
Procedure Name: Intubation Date/Time: 10/07/2013 10:23 PM Performed by: Vaughan Browner Pre-anesthesia Checklist: Patient identified, Emergency Drugs available, Suction available and Patient being monitored Patient Re-evaluated:Patient Re-evaluated prior to inductionOxygen Delivery Method: Circle system utilized Preoxygenation: Pre-oxygenation with 100% oxygen Intubation Type: IV induction, Rapid sequence and Cricoid Pressure applied Grade View: Grade I Tube size: 7.5 mm Number of attempts: 2 Airway Equipment and Method: Stylet and Video-laryngoscopy Placement Confirmation: ETT inserted through vocal cords under direct vision,  positive ETCO2 and breath sounds checked- equal and bilateral Secured at: 23 cm Tube secured with: Tape Dental Injury: Teeth and Oropharynx as per pre-operative assessment  Comments: DVL with MAC 3 - unable to obtain clear view of cords.  DVL with glidescope with grade I view.  ETT passed easily thru cords.  Cricoid pressure applied throughout mask ventilation between DLs.

## 2013-10-07 NOTE — ED Notes (Signed)
Patient presents to ED via [place]. Patient's wife called EMS stating that for the past 2 days patient has been having intermittent episodes of "garbled speech." Patient wife and EMS states that patient will have clear speech, then all of a sudden you cannot understand what he is saying- these intermittent episodes last approx 3-15 minutes. No other neuro deficits present. A&Ox4 upon arrival to ED. Speech is clear at this time. Hypertensive with EMS 196/97. CBG 402.
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
## 2013-10-07 NOTE — ED Notes (Signed)
Bedside report given to Neuro OR staff. Patient hooked up to monitor by this RN and Lanny Hurst EMT. Pt A&Ox4. Speech slurred. Family taken to waiting room.

## 2013-10-07 NOTE — ED Notes (Signed)
Speech is clear at this time. Pt remains A&Ox4. Facial droop on left side. Slight sensory difference on left side of face.

## 2013-10-07 NOTE — ED Notes (Signed)
Patient had another episode of "slurred speech" at approx 2036. Speech clear again at 2038. Dr. Nicole Kindred at bedside.  Rapid response nurse at bedside.

## 2013-10-08 ENCOUNTER — Inpatient Hospital Stay (HOSPITAL_COMMUNITY): Payer: Medicare Other

## 2013-10-08 ENCOUNTER — Encounter (HOSPITAL_COMMUNITY): Payer: Self-pay | Admitting: Neurosurgery

## 2013-10-08 DIAGNOSIS — E108 Type 1 diabetes mellitus with unspecified complications: Secondary | ICD-10-CM

## 2013-10-08 DIAGNOSIS — R269 Unspecified abnormalities of gait and mobility: Secondary | ICD-10-CM

## 2013-10-08 DIAGNOSIS — I6200 Nontraumatic subdural hemorrhage, unspecified: Secondary | ICD-10-CM

## 2013-10-08 DIAGNOSIS — IMO0002 Reserved for concepts with insufficient information to code with codable children: Secondary | ICD-10-CM

## 2013-10-08 DIAGNOSIS — E1065 Type 1 diabetes mellitus with hyperglycemia: Secondary | ICD-10-CM

## 2013-10-08 LAB — GLUCOSE, CAPILLARY
GLUCOSE-CAPILLARY: 243 mg/dL — AB (ref 70–99)
GLUCOSE-CAPILLARY: 151 mg/dL — AB (ref 70–99)
GLUCOSE-CAPILLARY: 187 mg/dL — AB (ref 70–99)
GLUCOSE-CAPILLARY: 209 mg/dL — AB (ref 70–99)
Glucose-Capillary: 163 mg/dL — ABNORMAL HIGH (ref 70–99)
Glucose-Capillary: 280 mg/dL — ABNORMAL HIGH (ref 70–99)
Glucose-Capillary: 307 mg/dL — ABNORMAL HIGH (ref 70–99)

## 2013-10-08 LAB — RAPID URINE DRUG SCREEN, HOSP PERFORMED
Amphetamines: NOT DETECTED
BARBITURATES: NOT DETECTED
Benzodiazepines: NOT DETECTED
COCAINE: NOT DETECTED
Opiates: NOT DETECTED
Tetrahydrocannabinol: NOT DETECTED

## 2013-10-08 LAB — URINALYSIS, ROUTINE W REFLEX MICROSCOPIC
BILIRUBIN URINE: NEGATIVE
Glucose, UA: 250 mg/dL — AB
Hgb urine dipstick: NEGATIVE
Ketones, ur: NEGATIVE mg/dL
LEUKOCYTES UA: NEGATIVE
NITRITE: NEGATIVE
Protein, ur: 30 mg/dL — AB
SPECIFIC GRAVITY, URINE: 1.021 (ref 1.005–1.030)
Urobilinogen, UA: 1 mg/dL (ref 0.0–1.0)
pH: 6 (ref 5.0–8.0)

## 2013-10-08 LAB — URINE MICROSCOPIC-ADD ON

## 2013-10-08 LAB — MRSA PCR SCREENING: MRSA BY PCR: NEGATIVE

## 2013-10-08 MED ORDER — MONTELUKAST SODIUM 10 MG PO TABS
10.0000 mg | ORAL_TABLET | Freq: Every day | ORAL | Status: DC
  Administered 2013-10-09 – 2013-10-12 (×4): 10 mg via ORAL
  Filled 2013-10-08 (×7): qty 1

## 2013-10-08 MED ORDER — SODIUM CHLORIDE 0.9 % IV SOLN
500.0000 mg | Freq: Two times a day (BID) | INTRAVENOUS | Status: DC
  Administered 2013-10-08 – 2013-10-10 (×7): 500 mg via INTRAVENOUS
  Filled 2013-10-08 (×9): qty 5

## 2013-10-08 MED ORDER — HYDROCORTISONE 1 % EX CREA
TOPICAL_CREAM | Freq: Three times a day (TID) | CUTANEOUS | Status: DC
  Administered 2013-10-08: 17:00:00 via TOPICAL
  Filled 2013-10-08: qty 28

## 2013-10-08 MED ORDER — CHLORHEXIDINE GLUCONATE 0.12 % MT SOLN
15.0000 mL | Freq: Two times a day (BID) | OROMUCOSAL | Status: DC
  Administered 2013-10-08 – 2013-10-09 (×3): 15 mL via OROMUCOSAL
  Filled 2013-10-08 (×2): qty 15

## 2013-10-08 MED ORDER — CARBIDOPA-LEVODOPA 25-100 MG PO TABS
0.5000 | ORAL_TABLET | Freq: Three times a day (TID) | ORAL | Status: DC
  Administered 2013-10-09: 0.5 via ORAL
  Filled 2013-10-08 (×7): qty 0.5

## 2013-10-08 MED ORDER — SIMVASTATIN 20 MG PO TABS
20.0000 mg | ORAL_TABLET | Freq: Every evening | ORAL | Status: DC
  Administered 2013-10-09 – 2013-10-12 (×4): 20 mg via ORAL
  Filled 2013-10-08 (×6): qty 1

## 2013-10-08 MED ORDER — BIOTENE DRY MOUTH MT LIQD
15.0000 mL | Freq: Two times a day (BID) | OROMUCOSAL | Status: DC
  Administered 2013-10-09 (×2): 15 mL via OROMUCOSAL

## 2013-10-08 MED ORDER — LINAGLIPTIN 5 MG PO TABS
5.0000 mg | ORAL_TABLET | Freq: Every day | ORAL | Status: DC
  Administered 2013-10-09 – 2013-10-13 (×5): 5 mg via ORAL
  Filled 2013-10-08 (×6): qty 1

## 2013-10-08 MED ORDER — DEXTROMETHORPHAN POLISTIREX 30 MG/5ML PO LQCR
15.0000 mg | Freq: Two times a day (BID) | ORAL | Status: DC
  Administered 2013-10-09 – 2013-10-13 (×9): 15 mg via ORAL
  Filled 2013-10-08 (×12): qty 5

## 2013-10-08 MED ORDER — ONDANSETRON HCL 4 MG/2ML IJ SOLN: 4.0000 mg | INTRAMUSCULAR | Status: DC | PRN

## 2013-10-08 MED ORDER — HYDRALAZINE HCL 20 MG/ML IJ SOLN
10.0000 mg | INTRAMUSCULAR | Status: DC | PRN
  Administered 2013-10-08 (×2): 20 mg via INTRAVENOUS
  Administered 2013-10-08: 40 mg via INTRAVENOUS
  Administered 2013-10-10 (×2): 20 mg via INTRAVENOUS
  Filled 2013-10-08 (×2): qty 1
  Filled 2013-10-08: qty 2
  Filled 2013-10-08: qty 1
  Filled 2013-10-08: qty 2

## 2013-10-08 MED ORDER — PROMETHAZINE HCL 25 MG PO TABS: 12.5000 mg | ORAL_TABLET | ORAL | Status: DC | PRN

## 2013-10-08 MED ORDER — TRIAMCINOLONE ACETONIDE 0.1 % EX CREA
1.0000 "application " | TOPICAL_CREAM | Freq: Every day | CUTANEOUS | Status: DC
  Administered 2013-10-08 – 2013-10-13 (×6): 1 via TOPICAL
  Filled 2013-10-08: qty 15

## 2013-10-08 MED ORDER — TAMSULOSIN HCL 0.4 MG PO CAPS
0.4000 mg | ORAL_CAPSULE | Freq: Every day | ORAL | Status: DC
  Administered 2013-10-09 – 2013-10-13 (×5): 0.4 mg via ORAL
  Filled 2013-10-08 (×6): qty 1

## 2013-10-08 MED ORDER — HYDRALAZINE HCL 20 MG/ML IJ SOLN
INTRAMUSCULAR | Status: DC | PRN
  Administered 2013-10-07: 10 mg via INTRAVENOUS

## 2013-10-08 MED ORDER — SENNOSIDES-DOCUSATE SODIUM 8.6-50 MG PO TABS
1.0000 | ORAL_TABLET | Freq: Every evening | ORAL | Status: DC | PRN
  Filled 2013-10-08 (×2): qty 1

## 2013-10-08 MED ORDER — MORPHINE SULFATE 2 MG/ML IJ SOLN
1.0000 mg | INTRAMUSCULAR | Status: DC | PRN
  Administered 2013-10-08: 2 mg via INTRAVENOUS
  Filled 2013-10-08: qty 1

## 2013-10-08 MED ORDER — FUROSEMIDE 20 MG PO TABS
20.0000 mg | ORAL_TABLET | Freq: Every day | ORAL | Status: DC
  Administered 2013-10-09 – 2013-10-13 (×5): 20 mg via ORAL
  Filled 2013-10-08 (×6): qty 1

## 2013-10-08 MED ORDER — PANTOPRAZOLE SODIUM 40 MG IV SOLR
40.0000 mg | Freq: Every day | INTRAVENOUS | Status: DC
  Administered 2013-10-08 (×2): 40 mg via INTRAVENOUS
  Filled 2013-10-08 (×3): qty 40

## 2013-10-08 MED ORDER — ASPIRIN EC 81 MG PO TBEC
81.0000 mg | DELAYED_RELEASE_TABLET | Freq: Every day | ORAL | Status: DC
  Administered 2013-10-09 – 2013-10-13 (×5): 81 mg via ORAL
  Filled 2013-10-08 (×6): qty 1

## 2013-10-08 MED ORDER — ACETAMINOPHEN 160 MG/5ML PO SOLN
325.0000 mg | ORAL | Status: DC | PRN
Start: 2013-10-08 — End: 2013-10-08

## 2013-10-08 MED ORDER — DOCUSATE SODIUM 100 MG PO CAPS
100.0000 mg | ORAL_CAPSULE | Freq: Two times a day (BID) | ORAL | Status: DC
  Administered 2013-10-09 – 2013-10-12 (×7): 100 mg via ORAL
  Filled 2013-10-08 (×10): qty 1

## 2013-10-08 MED ORDER — HYDRALAZINE HCL 20 MG/ML IJ SOLN
10.0000 mg | INTRAMUSCULAR | Status: DC | PRN
  Administered 2013-10-08: 20 mg via INTRAVENOUS
  Filled 2013-10-08: qty 1

## 2013-10-08 MED ORDER — INSULIN ASPART 100 UNIT/ML ~~LOC~~ SOLN
10.0000 [IU] | Freq: Once | SUBCUTANEOUS | Status: AC
  Administered 2013-10-08: 100 [IU] via SUBCUTANEOUS

## 2013-10-08 MED ORDER — LABETALOL HCL 5 MG/ML IV SOLN
10.0000 mg | INTRAVENOUS | Status: DC | PRN
  Administered 2013-10-08: 15 mg via INTRAVENOUS
  Administered 2013-10-08 (×3): 20 mg via INTRAVENOUS
  Administered 2013-10-08 – 2013-10-11 (×4): 40 mg via INTRAVENOUS
  Filled 2013-10-08 (×3): qty 4
  Filled 2013-10-08 (×2): qty 8
  Filled 2013-10-08: qty 4
  Filled 2013-10-08 (×4): qty 8

## 2013-10-08 MED ORDER — ONDANSETRON HCL 4 MG PO TABS: 4.0000 mg | ORAL_TABLET | ORAL | Status: DC | PRN

## 2013-10-08 MED ORDER — TRAMADOL HCL 50 MG PO TABS
50.0000 mg | ORAL_TABLET | Freq: Four times a day (QID) | ORAL | Status: DC | PRN
Start: 2013-10-08 — End: 2013-10-13

## 2013-10-08 MED ORDER — LEVOTHYROXINE SODIUM 75 MCG PO TABS
75.0000 ug | ORAL_TABLET | Freq: Every day | ORAL | Status: DC
  Administered 2013-10-09 – 2013-10-13 (×5): 75 ug via ORAL
  Filled 2013-10-08 (×7): qty 1

## 2013-10-08 MED ORDER — ACETAMINOPHEN 650 MG RE SUPP
650.0000 mg | RECTAL | Status: DC | PRN
  Filled 2013-10-08: qty 1

## 2013-10-08 MED ORDER — INSULIN ASPART 100 UNIT/ML ~~LOC~~ SOLN
SUBCUTANEOUS | Status: AC
Start: 2013-10-08 — End: 2013-10-08
  Filled 2013-10-08: qty 1

## 2013-10-08 MED ORDER — BISACODYL 10 MG RE SUPP: 10.0000 mg | Freq: Every day | RECTAL | Status: DC | PRN

## 2013-10-08 MED ORDER — LORATADINE 10 MG PO TABS
10.0000 mg | ORAL_TABLET | Freq: Every day | ORAL | Status: DC | PRN
  Filled 2013-10-08: qty 1

## 2013-10-08 MED ORDER — SENNA 8.6 MG PO TABS
1.0000 | ORAL_TABLET | Freq: Two times a day (BID) | ORAL | Status: DC
  Administered 2013-10-09 – 2013-10-13 (×8): 8.6 mg via ORAL
  Filled 2013-10-08 (×12): qty 1

## 2013-10-08 MED ORDER — ACETAMINOPHEN 500 MG PO TABS: 1000.0000 mg | ORAL_TABLET | Freq: Four times a day (QID) | ORAL | Status: DC | PRN

## 2013-10-08 MED ORDER — B COMPLEX PO TABS: 1.0000 | ORAL_TABLET | Freq: Every day | ORAL | Status: DC

## 2013-10-08 MED ORDER — CEFAZOLIN SODIUM-DEXTROSE 2-3 GM-% IV SOLR
2.0000 g | Freq: Three times a day (TID) | INTRAVENOUS | Status: AC
  Administered 2013-10-08 (×2): 2 g via INTRAVENOUS
  Filled 2013-10-08 (×2): qty 50

## 2013-10-08 MED ORDER — ONDANSETRON HCL 4 MG/2ML IJ SOLN: 4.0000 mg | Freq: Once | INTRAMUSCULAR | Status: DC | PRN

## 2013-10-08 MED ORDER — CLOTRIMAZOLE 1 % EX CREA
TOPICAL_CREAM | Freq: Every day | CUTANEOUS | Status: DC
  Administered 2013-10-08 – 2013-10-11 (×4): via TOPICAL
  Filled 2013-10-08: qty 15

## 2013-10-08 MED ORDER — AMLODIPINE BESYLATE 10 MG PO TABS
10.0000 mg | ORAL_TABLET | Freq: Every day | ORAL | Status: DC
  Administered 2013-10-09 – 2013-10-13 (×5): 10 mg via ORAL
  Filled 2013-10-08 (×6): qty 1

## 2013-10-08 MED ORDER — PANTOPRAZOLE SODIUM 40 MG PO TBEC: 40.0000 mg | DELAYED_RELEASE_TABLET | Freq: Every day | ORAL | Status: DC

## 2013-10-08 MED ORDER — ACETAMINOPHEN 325 MG PO TABS
650.0000 mg | ORAL_TABLET | ORAL | Status: DC | PRN
  Administered 2013-10-11 – 2013-10-12 (×2): 650 mg via ORAL
  Filled 2013-10-08 (×2): qty 2

## 2013-10-08 MED ORDER — ACETAMINOPHEN 325 MG PO TABS
325.0000 mg | ORAL_TABLET | ORAL | Status: DC | PRN
Start: 2013-10-08 — End: 2013-10-08

## 2013-10-08 MED ORDER — FLEET ENEMA 7-19 GM/118ML RE ENEM
1.0000 | ENEMA | Freq: Once | RECTAL | Status: AC | PRN
  Filled 2013-10-08: qty 1

## 2013-10-08 MED ORDER — B COMPLEX-C PO TABS
1.0000 | ORAL_TABLET | Freq: Every day | ORAL | Status: DC
  Administered 2013-10-09 – 2013-10-13 (×5): 1 via ORAL
  Filled 2013-10-08 (×6): qty 1

## 2013-10-08 MED ORDER — VITAMIN D3 25 MCG (1000 UNIT) PO TABS
2000.0000 [IU] | ORAL_TABLET | Freq: Every day | ORAL | Status: DC
  Administered 2013-10-09 – 2013-10-13 (×5): 2000 [IU] via ORAL
  Filled 2013-10-08 (×6): qty 2

## 2013-10-08 MED ORDER — ALLOPURINOL 300 MG PO TABS
300.0000 mg | ORAL_TABLET | Freq: Every day | ORAL | Status: DC
  Administered 2013-10-09 – 2013-10-13 (×5): 300 mg via ORAL
  Filled 2013-10-08 (×6): qty 1

## 2013-10-08 MED ORDER — NAPROXEN 500 MG PO TABS: 500.0000 mg | ORAL_TABLET | Freq: Two times a day (BID) | ORAL | Status: DC

## 2013-10-08 MED ORDER — PREDNISONE 5 MG PO TABS
5.0000 mg | ORAL_TABLET | Freq: Two times a day (BID) | ORAL | Status: DC
  Filled 2013-10-08 (×5): qty 1

## 2013-10-08 MED ORDER — INSULIN ASPART 100 UNIT/ML ~~LOC~~ SOLN
0.0000 [IU] | SUBCUTANEOUS | Status: DC
  Administered 2013-10-08: 4 [IU] via SUBCUTANEOUS
  Administered 2013-10-08: 11 [IU] via SUBCUTANEOUS
  Administered 2013-10-08 (×2): 7 [IU] via SUBCUTANEOUS
  Administered 2013-10-08 – 2013-10-09 (×6): 4 [IU] via SUBCUTANEOUS
  Administered 2013-10-09 – 2013-10-10 (×2): 3 [IU] via SUBCUTANEOUS

## 2013-10-08 NOTE — Progress Notes (Signed)
Pt's wife brought pt's home CPAP nasal mask to use. Order obtained for use and RT notified.   Joshua Glass

## 2013-10-08 NOTE — Progress Notes (Signed)
SLP Cancellation Note  Patient Details Name: Joshua Glass MRN: [id number] DOB: Mar 13, 1933   Cancelled treatment:        Attempted bedside swallow earlier this morning.  Pt. Unable to stay awake (surgery late last night).  Called RN in afternoon and pt. not maintaining awake state.  Will see a.m. 3/21 for swallow eval   Orbie Pyo [place].Ed [employer] [phone] 10/08/2013

## 2013-10-08 NOTE — Anesthesia Postprocedure Evaluation (Signed)
  Anesthesia Post-op Note  Patient: Joshua Glass  Procedure(s) Performed: Procedure(s): CRANIOTOMY HEMATOMA EVACUATION SUBDURAL (Left)  Patient Location: PACU  Anesthesia Type:General  Level of Consciousness: awake  Airway and Oxygen Therapy: Patient Spontanous Breathing and Patient connected to nasal cannula oxygen  Post-op Pain: mild  Post-op Assessment: Post-op Vital signs reviewed, Patient's Cardiovascular Status Stable, Respiratory Function Stable, Patent Airway, No signs of Nausea or vomiting and Pain level controlled  Post-op Vital Signs: Reviewed and stable  Complications: No apparent anesthesia complications
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
## 2013-10-08 NOTE — Progress Notes (Signed)
PULMONARY / CRITICAL CARE MEDICINE   Name: Joshua Glass MRN: [id number] DOB: 1932/11/23    ADMISSION DATE:  10/07/2013 CONSULTATION DATE:  3/20  REFERRING MD :  Vertell Limber PRIMARY SERVICE: stern   CHIEF COMPLAINT:  Post-op med management   BRIEF PATIENT DESCRIPTION:  This is a 80 year old male s/p left crani for traumatic left SDH on 3/19. PCCM asked to eval post-op to assist with medical management.   PMH -diabetes mellitus with diabetic peripheral neuropathy, atrial fibrillation not on anticoagulation, Parkinson's disease, and hypertension, baseline ambulates with walker, h/o  falls and head trauma 1 week ago and again 3 days ago.   SIGNIFICANT EVENTS / STUDIES:  CT head 3/19: Acute subdural hematoma on the left measuring 12.4 mm. Localized mass effect without significant midline shift.  LINES / TUBES: PIV  CULTURES: None  ANTIBIOTICS: None  SUBJECTIVE:  Post-op still slow to respond   VITAL SIGNS: Temp:  [97 F (36.1 C)-98.2 F (36.8 C)] 97.7 F (36.5 C) (03/20 0700) Pulse Rate:  [62-107] 62 (03/20 0700) Resp:  [14-27] 15 (03/20 0700) BP: (130-200)/(52-96) 130/52 mmHg (03/20 0700) SpO2:  [94 %-100 %] 99 % (03/20 0700) Arterial Line BP: (134-206)/(54-100) 134/56 mmHg (03/20 0700) Weight:  [240 lb 1.3 oz (108.9 kg)] 240 lb 1.3 oz (108.9 kg) (03/19 2115) HEMODYNAMICS:   VENTILATOR SETTINGS:   INTAKE / OUTPUT: Intake/Output     03/19 0701 - 03/20 0700 03/20 0701 - 03/21 0700   I.V. (mL/kg) 1425 (13.1)    IV Piggyback 155    Total Intake(mL/kg) 1580 (14.5)    Urine (mL/kg/hr) 1625    Drains 190    Blood 50    Total Output 1865     Net -285           PHYSICAL EXAMINATION: General:  Arousable but remains sluggish. Neuro:  Awake, tracks, follows commands, non-verbal, right side weaker  HEENT:  Left JP bulb draining bloody drainage. Dressing intact. Diffuse ecchymosis over face and neck  Cardiovascular: RRR, Nl S1/S2, -M/R/G. Lungs: Decreased, no accessory muscle  use, transmittable upper airway sounds. Abdomen:  Obese + bowel sounds  Musculoskeletal:  Intact  Skin:  Dry and intact   LABS:  CBC  Recent Labs Lab 10/07/13 2021 10/07/13 2039  WBC 18.3*  --   HGB 14.9 15.6  HCT 42.0 46.0  PLT 342  --    Coag's  Recent Labs Lab 10/07/13 2021  APTT 27  INR 1.00   BMET  Recent Labs Lab 10/07/13 2021 10/07/13 2039  NA 134* 135*  K 4.1 4.1  CL 93* 98  CO2 25  --   BUN 21 22  CREATININE 0.87 0.90  GLUCOSE 429* 423*   Electrolytes  Recent Labs Lab 10/07/13 2021  CALCIUM 9.2   Sepsis Markers No results found for this basename: LATICACIDVEN, PROCALCITON, O2SATVEN,  in the last 168 hours ABG No results found for this basename: PHART, PCO2ART, PO2ART,  in the last 168 hours Liver Enzymes  Recent Labs Lab 10/07/13 2021  AST 32  ALT 37  ALKPHOS 105  BILITOT 0.9  ALBUMIN 3.6   Cardiac Enzymes No results found for this basename: TROPONINI, PROBNP,  in the last 168 hours Glucose  Recent Labs Lab 10/08/13 0006 10/08/13 0049 10/08/13 0349 10/08/13 0750  GLUCAP 307* 280* 243* 151*    Imaging Ct Head Wo Contrast  10/07/2013   CLINICAL DATA:  Code stroke.  Aphasia.  Head trauma 1 week ago  EXAM:  CT HEAD WITHOUT CONTRAST  TECHNIQUE: Contiguous axial images were obtained from the base of the skull through the vertex without intravenous contrast.  COMPARISON:  CT head 01/28/2013  FINDINGS: Left frontal temporal subdural high-density hematoma measures 12 mm in thickness. This is consistent with acute hemorrhage. There is mild mass-effect on the brain. No significant midline shift likely due to the degree of atrophy present. No subarachnoid or parenchymal hemorrhage  Moderate atrophy and moderate chronic microvascular ischemic changes in the white matter. No acute infarct or mass.  Negative for skull fracture. Chronic sinusitis with mucosal edema in the left maxillary sinus. Bruising overlying the left maxilla and orbit.   IMPRESSION: Acute subdural hematoma on the left measuring 12.4 mm. Localized mass effect without significant midline shift.  Atrophy and chronic microvascular ischemia.  Critical Value/emergent results were called by telephone at the time of interpretation on 10/07/2013 at 8:48 PM to Dr. Doy Mince, who verbally acknowledged these results.   Electronically Signed   By: Franchot Gallo M.D.   On: 10/07/2013 20:49   Dg Chest Port 1 View  10/08/2013   CLINICAL DATA:  Difficulty breathing  EXAM: PORTABLE CHEST - 1 VIEW  COMPARISON:  September 23, 2013  FINDINGS: There is no edema or consolidation. Heart is mildly enlarged with normal pulmonary vascularity. No adenopathy. No pneumothorax.  IMPRESSION: Mild cardiac enlargement.  No edema or consolidation.   Electronically Signed   By: Lowella Grip M.D.   On: 10/08/2013 07:49   CXR:   ASSESSMENT / PLAN:  PULMONARY A: h/o osa  Extubated post-op P:   - Cont O2 and pulse ox - Hold off on CPAP for now given decreased LOC and aspiration risk, and concern for inability to protect airway, if more arousble by AM then will consider restarting. - CXR in am.  CARDIOVASCULAR A:  H/o essential HTN P:  - Tele monitoring. - PRN hydralazine for SBP >160.  RENAL A:   borderline hyponatremia  >on HCTZ P:   - F/u am chemistry.  GASTROINTESTINAL A:   H/o esophageal stricture w/ dilation (remote) Dysphagia risk P:   - Swallow evaluation today.  HEMATOLOGIC A:  No acute  P:  - Trend cbc. - SCDs, no heparin.   INFECTIOUS A:  No evidence of infection  P:   - Trend fever and wbc curve, hold off abx.  ENDOCRINE A:   DM type II w/ hyperglycemia  Hypothyroidism  Prednisone for recent gout flare  P:   - SSI. - Replace synthroid. - Taper steroids per NS.  NEUROLOGIC A:   S/p crani for evac of traumatic left SDH H/o parkinsons  P:   - Per n-surg. - Will likely need feeding tube if going to get home meds if fails swallow evaluation.   TODAY'S  SUMMARY: Remains sluggish, concern for airway protection, swallow evaluation as ordered, continue to hold CPAP for now.   I have personally obtained a history, examined the patient, evaluated laboratory and imaging results, formulated the assessment and plan and placed orders.  CRITICAL CARE: The patient is critically ill with multiple organ systems failure and requires high complexity decision making for assessment and support, frequent evaluation and titration of therapies, application of advanced monitoring technologies and extensive interpretation of multiple databases. Critical Care Time devoted to patient care services described in this note is 40 minutes.   Rush Farmer, M.D. [facility] Pulmonary/Critical Care Medicine. Pager: [phone]. After hours pager: [phone].

## 2013-10-08 NOTE — Progress Notes (Signed)
Subjective: Has been confused but improving since surgery. No further intermittent episodes concerning for seizures.   Exam: Filed Vitals:   10/08/13 0700  BP: 130/52  Pulse: 62  Temp: 97.7 F (36.5 C)  Resp: 15   Gen: In bed, NAD MS: Awake, alert, oriented to person, place, month, year. Unable to give clear reason why he is in the hospital.  FF:MBWGY, VFF Motor: follows commands x 4, no clear hemiparesis Sensory:intact to LT   Impression: 80 yo M presenting with likely CPS in the setting of SDH. He appears to be improving.   Recommendations: 1) Continue keppra 500mg  BId.  2) will continue to follow.   Roland Rack, MD [facility] [phone]  If 7pm- 7am, please page neurology on call at [phone].

## 2013-10-08 NOTE — Progress Notes (Signed)
UR completed.  Josua Ferrebee, RN BSN [facility] CCM Trauma/Neuro ICU Case Manager [phone]  

## 2013-10-08 NOTE — Progress Notes (Signed)
Patient is doing well.  His repeat CT shows resolution of SDH.  Observe in ICU.

## 2013-10-08 NOTE — Transfer of Care (Signed)
Immediate Anesthesia Transfer of Care Note  Patient: Joshua Glass  Procedure(s) Performed: Procedure(s): CRANIOTOMY HEMATOMA EVACUATION SUBDURAL (Left)  Patient Location: ICU  Anesthesia Type:General  Level of Consciousness: responds to stimulation  Airway & Oxygen Therapy: Patient Spontanous Breathing and Patient connected to face mask oxygen  Post-op Assessment: Report given to PACU RN, Post -op Vital signs reviewed and stable and Patient moving all extremities  Post vital signs: Reviewed and stable  Complications: No apparent anesthesia complications

## 2013-10-08 NOTE — Progress Notes (Signed)
Subjective: Patient reports "[name]"  Objective: Vital signs in last 24 hours: Temp:  [97 F (36.1 C)-98.2 F (36.8 C)] 97.7 F (36.5 C) (03/20 0700) Pulse Rate:  [62-107] 62 (03/20 0700) Resp:  [14-27] 15 (03/20 0700) BP: (130-200)/(52-96) 130/52 mmHg (03/20 0700) SpO2:  [94 %-100 %] 99 % (03/20 0700) Arterial Line BP: (134-206)/(54-100) 134/56 mmHg (03/20 0700) Weight:  [108.9 kg (240 lb 1.3 oz)] 108.9 kg (240 lb 1.3 oz) (03/19 2115)  Intake/Output from previous day: 03/19 0701 - 03/20 0700 In: 1580 [I.V.:1425; IV Piggyback:155] Out: 4196 [Urine:1625; Drains:190; Blood:50] Intake/Output this shift:    Awake, states name & location, but somnolent.  Follows commands all extremities before falling asleep again. PEARL.  JP patent, bloody. Lab Results:  Recent Labs  10/07/13 2021 10/07/13 2039  WBC 18.3*  --   HGB 14.9 15.6  HCT 42.0 46.0  PLT 342  --    BMET  Recent Labs  10/07/13 2021 10/07/13 2039  NA 134* 135*  K 4.1 4.1  CL 93* 98  CO2 25  --   GLUCOSE 429* 423*  BUN 21 22  CREATININE 0.87 0.90  CALCIUM 9.2  --     Studies/Results: Ct Head Wo Contrast  10/07/2013   CLINICAL DATA:  Code stroke.  Aphasia.  Head trauma 1 week ago  EXAM: CT HEAD WITHOUT CONTRAST  TECHNIQUE: Contiguous axial images were obtained from the base of the skull through the vertex without intravenous contrast.  COMPARISON:  CT head 01/28/2013  FINDINGS: Left frontal temporal subdural high-density hematoma measures 12 mm in thickness. This is consistent with acute hemorrhage. There is mild mass-effect on the brain. No significant midline shift likely due to the degree of atrophy present. No subarachnoid or parenchymal hemorrhage  Moderate atrophy and moderate chronic microvascular ischemic changes in the white matter. No acute infarct or mass.  Negative for skull fracture. Chronic sinusitis with mucosal edema in the left maxillary sinus. Bruising overlying the left maxilla and orbit.   IMPRESSION: Acute subdural hematoma on the left measuring 12.4 mm. Localized mass effect without significant midline shift.  Atrophy and chronic microvascular ischemia.  Critical Value/emergent results were called by telephone at the time of interpretation on 10/07/2013 at 8:48 PM to Dr. Doy Mince, who verbally acknowledged these results.   Electronically Signed   By: Franchot Gallo M.D.   On: 10/07/2013 20:49   Dg Chest Port 1 View  10/08/2013   CLINICAL DATA:  Difficulty breathing  EXAM: PORTABLE CHEST - 1 VIEW  COMPARISON:  September 23, 2013  FINDINGS: There is no edema or consolidation. Heart is mildly enlarged with normal pulmonary vascularity. No adenopathy. No pneumothorax.  IMPRESSION: Mild cardiac enlargement.  No edema or consolidation.   Electronically Signed   By: Lowella Grip M.D.   On: 10/08/2013 07:49    Assessment/Plan:   LOS: 1 day  Per DrStern , will change CT order to this am for post-op eval.    Joshua Glass, Joshua Glass 10/08/2013, 7:56 AM

## 2013-10-09 ENCOUNTER — Inpatient Hospital Stay (HOSPITAL_COMMUNITY): Payer: Medicare Other

## 2013-10-09 DIAGNOSIS — E108 Type 1 diabetes mellitus with unspecified complications: Secondary | ICD-10-CM

## 2013-10-09 DIAGNOSIS — E1065 Type 1 diabetes mellitus with hyperglycemia: Secondary | ICD-10-CM

## 2013-10-09 DIAGNOSIS — IMO0002 Reserved for concepts with insufficient information to code with codable children: Secondary | ICD-10-CM

## 2013-10-09 LAB — GLUCOSE, CAPILLARY
GLUCOSE-CAPILLARY: 116 mg/dL — AB (ref 70–99)
GLUCOSE-CAPILLARY: 149 mg/dL — AB (ref 70–99)
GLUCOSE-CAPILLARY: 199 mg/dL — AB (ref 70–99)
Glucose-Capillary: 118 mg/dL — ABNORMAL HIGH (ref 70–99)
Glucose-Capillary: 171 mg/dL — ABNORMAL HIGH (ref 70–99)
Glucose-Capillary: 178 mg/dL — ABNORMAL HIGH (ref 70–99)

## 2013-10-09 LAB — BASIC METABOLIC PANEL
BUN: 16 mg/dL (ref 6–23)
CHLORIDE: 107 meq/L (ref 96–112)
CO2: 25 mEq/L (ref 19–32)
CREATININE: 0.94 mg/dL (ref 0.50–1.35)
Calcium: 8.6 mg/dL (ref 8.4–10.5)
GFR calc Af Amer: >90 mL/min (ref >90)
GFR, EST NON AFRICAN AMERICAN: 78 mL/min — AB (ref >90)
Glucose, Bld: 141 mg/dL — ABNORMAL HIGH (ref 70–99)
POTASSIUM: 3.9 meq/L (ref 3.7–5.3)
Sodium: 144 mEq/L (ref 137–147)

## 2013-10-09 LAB — CBC
HEMATOCRIT: 39.3 % (ref 39.0–52.0)
Hemoglobin: 13.1 g/dL (ref 13.0–17.0)
MCH: 28.3 pg (ref 26.0–34.0)
MCHC: 33.3 g/dL (ref 30.0–36.0)
MCV: 84.9 fL (ref 78.0–100.0)
PLATELETS: 327 10*3/uL (ref 150–400)
RBC: 4.63 MIL/uL (ref 4.22–5.81)
RDW: 16.1 % — ABNORMAL HIGH (ref 11.5–15.5)
WBC: 19.4 10*3/uL — AB (ref 4.0–10.5)

## 2013-10-09 LAB — MAGNESIUM: MAGNESIUM: 2 mg/dL (ref 1.5–2.5)

## 2013-10-09 LAB — PHOSPHORUS: Phosphorus: 2.8 mg/dL (ref 2.3–4.6)

## 2013-10-09 MED ORDER — CARBIDOPA-LEVODOPA 25-100 MG PO TABS
1.5000 | ORAL_TABLET | Freq: Three times a day (TID) | ORAL | Status: DC
  Administered 2013-10-09 – 2013-10-13 (×13): 1.5 via ORAL
  Filled 2013-10-09 (×17): qty 1.5

## 2013-10-09 MED ORDER — PREDNISONE 5 MG PO TABS
5.0000 mg | ORAL_TABLET | Freq: Every day | ORAL | Status: DC
  Administered 2013-10-09 – 2013-10-10 (×2): 5 mg via ORAL
  Filled 2013-10-09 (×3): qty 1

## 2013-10-09 MED ORDER — PANTOPRAZOLE SODIUM 40 MG PO TBEC
40.0000 mg | DELAYED_RELEASE_TABLET | Freq: Every day | ORAL | Status: DC
  Administered 2013-10-09 – 2013-10-12 (×4): 40 mg via ORAL
  Filled 2013-10-09 (×4): qty 1

## 2013-10-09 NOTE — Progress Notes (Signed)
Joshua Glass is very weak and unsteady on his feet. He co-operates with nursing and is a two people assist to transfer from bed to chair. Wife is trying to manage him at home however she feels limited and is exhausted. She would like for him to be considered for in-patient rehab.

## 2013-10-09 NOTE — Progress Notes (Signed)
Talked with RN last night about CPAP and decided we would use O2 unless he definitely needed CPAP and we would find a way to use with dressing on head from surgery. No distress throughout the night. Will continue to monitor.

## 2013-10-09 NOTE — Progress Notes (Signed)
Subjective: No further seizures.   Exam: Filed Vitals:   10/09/13 0800  BP: 156/59  Pulse: 71  Temp:   Resp: 17   Gen: In bed, NAD MS: Awake, alert, interactive and appropriate ER:XVQMG, VFF Motor: Good strength with the exception of left shoulder injury Sensory:intact to LT   Impression: 80 yo M with recurrent episodes of self-limited confusion concerning for seizures prior to evacuation of SDH. Now on keppra with no further episodes of concern.   He tells me this am he takes 1.5 tabs TID of sinemet and will start.   Recommendations: 1) Continue keppra 500mg  BID 2) I increased sinemet to home dose of 1.5 tabs TID 3) can follow up with his outpatient neurologist, may be able to wean off of keppra in the future, but would continue for now.  4) No further recommendations at this time. Please call with any further questions.   Roland Rack, MD [facility] [phone]  If 7pm- 7am, please page neurology on call as listed in [place].

## 2013-10-09 NOTE — Progress Notes (Signed)
Patient ID: Joshua Glass, male   DOB: [birth year], 80 y.o.   MRN: [id number] Subjective: Patient reports no headache. No nausea and vomiting.  Objective: Vital signs in last 24 hours: Temp:  [97.7 F (36.5 C)-99.1 F (37.3 C)] 97.7 F (36.5 C) (03/21 0751) Pulse Rate:  [56-73] 69 (03/21 0700) Resp:  [12-29] 20 (03/21 0700) BP: (125-188)/(45-71) 125/62 mmHg (03/21 0700) SpO2:  [91 %-100 %] 94 % (03/21 0700) Arterial Line BP: (79-189)/(44-70) 173/58 mmHg (03/21 0700)  Intake/Output from previous day: 03/20 0701 - 03/21 0700 In: 2060 [I.V.:1800; IV Piggyback:260] Out: 9024 [Urine:1175; Drains:80] Intake/Output this shift:    Neurologic: Mental status: Alert, oriented, thought content appropriate Cranial nerves: III,IV,VI: extraocular muscles extra-ocular motions intact, VII: upper facial muscle function normal bilaterally, VII: lower facial muscle function normal bilaterally Motor: Normal except for weakness of the left shoulder from his rotator cuff  Lab Results: Lab Results  Component Value Date   WBC 19.4* 10/09/2013   HGB 13.1 10/09/2013   HCT 39.3 10/09/2013   MCV 84.9 10/09/2013   PLT 327 10/09/2013   Lab Results  Component Value Date   INR 1.00 10/07/2013   BMET Lab Results  Component Value Date   NA 144 10/09/2013   K 3.9 10/09/2013   CL 107 10/09/2013   CO2 25 10/09/2013   GLUCOSE 141* 10/09/2013   BUN 16 10/09/2013   CREATININE 0.94 10/09/2013   CALCIUM 8.6 10/09/2013    Studies/Results: Ct Head Wo Contrast  10/08/2013   CLINICAL DATA:  Subdural hematoma post drainage  EXAM: CT HEAD WITHOUT CONTRAST  TECHNIQUE: Contiguous axial images were obtained from the base of the skull through the vertex without intravenous contrast.  COMPARISON:  CT head 10/07/2013  FINDINGS: Interval craniotomy left frontal bone for subdural drainage. There is a scalp drain on the left which does not extend into the intracranial space.  There is satisfactory drainage of left-sided subdural  hematoma. Small amount of residual high density subdural blood is present as well as a small amount of subdural gas on the left. Improvement in mass effect on the left hemisphere. Mild midline shift 2 mm unchanged.  Generalized atrophy. Chronic microvascular ischemic change in the white matter.  Mucosal edema left maxillary sinus remains.  IMPRESSION: Satisfactory subdural drainage on the left. Small amount of residual subdural blood and gas process on the left. Improvement in mass effect.   Electronically Signed   By: Franchot Gallo M.D.   On: 10/08/2013 09:54   Ct Head Wo Contrast  10/07/2013   CLINICAL DATA:  Code stroke.  Aphasia.  Head trauma 1 week ago  EXAM: CT HEAD WITHOUT CONTRAST  TECHNIQUE: Contiguous axial images were obtained from the base of the skull through the vertex without intravenous contrast.  COMPARISON:  CT head 01/28/2013  FINDINGS: Left frontal temporal subdural high-density hematoma measures 12 mm in thickness. This is consistent with acute hemorrhage. There is mild mass-effect on the brain. No significant midline shift likely due to the degree of atrophy present. No subarachnoid or parenchymal hemorrhage  Moderate atrophy and moderate chronic microvascular ischemic changes in the white matter. No acute infarct or mass.  Negative for skull fracture. Chronic sinusitis with mucosal edema in the left maxillary sinus. Bruising overlying the left maxilla and orbit.  IMPRESSION: Acute subdural hematoma on the left measuring 12.4 mm. Localized mass effect without significant midline shift.  Atrophy and chronic microvascular ischemia.  Critical Value/emergent results were called by telephone at the time  of interpretation on 10/07/2013 at 8:48 PM to Dr. Doy Mince, who verbally acknowledged these results.   Electronically Signed   By: Franchot Gallo M.D.   On: 10/07/2013 20:49   Dg Chest Port 1 View  10/09/2013   CLINICAL DATA:  Respiratory failure  EXAM: PORTABLE CHEST - 1 VIEW  COMPARISON:  DG  CHEST 1V PORT dated 10/08/2013  FINDINGS: Low lung volumes. The cardiac silhouette is enlarged. Both lungs are clear. The visualized skeletal structures are unremarkable.  IMPRESSION: No active disease.   Electronically Signed   By: Margaree Mackintosh M.D.   On: 10/09/2013 07:55   Dg Chest Port 1 View  10/08/2013   CLINICAL DATA:  Difficulty breathing  EXAM: PORTABLE CHEST - 1 VIEW  COMPARISON:  September 23, 2013  FINDINGS: There is no edema or consolidation. Heart is mildly enlarged with normal pulmonary vascularity. No adenopathy. No pneumothorax.  IMPRESSION: Mild cardiac enlargement.  No edema or consolidation.   Electronically Signed   By: Lowella Grip M.D.   On: 10/08/2013 07:49    Assessment/Plan: Seems to be doing well. I removed his JP drain and his dressing. We will try to mobilize him today.   LOS: 2 days    Lindey Renzulli S 10/09/2013, 8:12 AM

## 2013-10-09 NOTE — Progress Notes (Signed)
PULMONARY / CRITICAL CARE MEDICINE   Name: Joshua Glass MRN: [id number] DOB: Mar 26, 1933    ADMISSION DATE:  10/07/2013 CONSULTATION DATE:  3/20  REFERRING MD :  Vertell Limber PRIMARY SERVICE: stern   CHIEF COMPLAINT:  Post-op med management   BRIEF PATIENT DESCRIPTION:  This is a 80 year old male s/p left crani for traumatic left SDH on 3/19. PCCM asked to eval post-op to assist with medical management.   PMH -OSA on cpap, diabetes mellitus with diabetic peripheral neuropathy, atrial fibrillation not on anticoagulation, Parkinson's disease, and hypertension, baseline ambulates with walker, h/o  falls and head trauma 1 week ago and again 3 days ago.   SIGNIFICANT EVENTS / STUDIES:  CT head 3/19: Acute subdural hematoma on the left measuring 12.4 mm. Localized mass effect without significant midline shift.  LINES / TUBES: ETT 3/19 >> 3/19  CULTURES: None  ANTIBIOTICS: None  SUBJECTIVE:  Afebrile Improved mental status Good UO  VITAL SIGNS: Temp:  [98.2 F (36.8 C)-99.1 F (37.3 C)] 98.4 F (36.9 C) (03/21 0336) Pulse Rate:  [56-73] 69 (03/21 0700) Resp:  [12-29] 20 (03/21 0700) BP: (125-188)/(45-71) 125/62 mmHg (03/21 0700) SpO2:  [91 %-100 %] 94 % (03/21 0700) Arterial Line BP: (79-189)/(44-70) 173/58 mmHg (03/21 0700) HEMODYNAMICS:   VENTILATOR SETTINGS:   INTAKE / OUTPUT: Intake/Output     03/20 0701 - 03/21 0700 03/21 0701 - 03/22 0700   I.V. (mL/kg) 1800 (16.5)    IV Piggyback 260    Total Intake(mL/kg) 2060 (18.9)    Urine (mL/kg/hr) 1175 (0.4)    Drains 80 (0)    Blood     Total Output 1255     Net +805           PHYSICAL EXAMINATION: General:  Arousable, appears chronically ill Neuro:  Awake, alert, follows commands, slow speech, right side weaker  HEENT:  Left JP bulb - bloody drainage. Dressing intact. Diffuse ecchymosis over face and neck  Cardiovascular: RRR, Nl S1/S2, -M/R/G. Lungs: Decreased, no accessory muscle use, transmittable upper airway  sounds. Abdomen:  Obese + bowel sounds  Musculoskeletal:  Intact  Skin:  Dry and intact   LABS:  CBC  Recent Labs Lab 10/07/13 2021 10/07/13 2039 10/09/13 0621  WBC 18.3*  --  19.4*  HGB 14.9 15.6 13.1  HCT 42.0 46.0 39.3  PLT 342  --  327   Coag's  Recent Labs Lab 10/07/13 2021  APTT 27  INR 1.00   BMET  Recent Labs Lab 10/07/13 2021 10/07/13 2039 10/09/13 0621  NA 134* 135* 144  K 4.1 4.1 3.9  CL 93* 98 107  CO2 25  --  25  BUN 21 22 16   CREATININE 0.87 0.90 0.94  GLUCOSE 429* 423* 141*   Electrolytes  Recent Labs Lab 10/07/13 2021 10/09/13 0621  CALCIUM 9.2 8.6  MG  --  2.0  PHOS  --  2.8   Sepsis Markers No results found for this basename: LATICACIDVEN, PROCALCITON, O2SATVEN,  in the last 168 hours ABG No results found for this basename: PHART, PCO2ART, PO2ART,  in the last 168 hours Liver Enzymes  Recent Labs Lab 10/07/13 2021  AST 32  ALT 37  ALKPHOS 105  BILITOT 0.9  ALBUMIN 3.6   Cardiac Enzymes No results found for this basename: TROPONINI, PROBNP,  in the last 168 hours Glucose  Recent Labs Lab 10/08/13 0750 10/08/13 1227 10/08/13 1553 10/08/13 2011 10/08/13 2359 10/09/13 0335  GLUCAP 151* 163* 187* 209*  178* 118*    Imaging Ct Head Wo Contrast  10/08/2013   CLINICAL DATA:  Subdural hematoma post drainage  EXAM: CT HEAD WITHOUT CONTRAST  TECHNIQUE: Contiguous axial images were obtained from the base of the skull through the vertex without intravenous contrast.  COMPARISON:  CT head 10/07/2013  FINDINGS: Interval craniotomy left frontal bone for subdural drainage. There is a scalp drain on the left which does not extend into the intracranial space.  There is satisfactory drainage of left-sided subdural hematoma. Small amount of residual high density subdural blood is present as well as a small amount of subdural gas on the left. Improvement in mass effect on the left hemisphere. Mild midline shift 2 mm unchanged.   Generalized atrophy. Chronic microvascular ischemic change in the white matter.  Mucosal edema left maxillary sinus remains.  IMPRESSION: Satisfactory subdural drainage on the left. Small amount of residual subdural blood and gas process on the left. Improvement in mass effect.   Electronically Signed   By: Franchot Gallo M.D.   On: 10/08/2013 09:54   Ct Head Wo Contrast  10/07/2013   CLINICAL DATA:  Code stroke.  Aphasia.  Head trauma 1 week ago  EXAM: CT HEAD WITHOUT CONTRAST  TECHNIQUE: Contiguous axial images were obtained from the base of the skull through the vertex without intravenous contrast.  COMPARISON:  CT head 01/28/2013  FINDINGS: Left frontal temporal subdural high-density hematoma measures 12 mm in thickness. This is consistent with acute hemorrhage. There is mild mass-effect on the brain. No significant midline shift likely due to the degree of atrophy present. No subarachnoid or parenchymal hemorrhage  Moderate atrophy and moderate chronic microvascular ischemic changes in the white matter. No acute infarct or mass.  Negative for skull fracture. Chronic sinusitis with mucosal edema in the left maxillary sinus. Bruising overlying the left maxilla and orbit.  IMPRESSION: Acute subdural hematoma on the left measuring 12.4 mm. Localized mass effect without significant midline shift.  Atrophy and chronic microvascular ischemia.  Critical Value/emergent results were called by telephone at the time of interpretation on 10/07/2013 at 8:48 PM to Dr. Doy Mince, who verbally acknowledged these results.   Electronically Signed   By: Franchot Gallo M.D.   On: 10/07/2013 20:49   Dg Chest Port 1 View  10/08/2013   CLINICAL DATA:  Difficulty breathing  EXAM: PORTABLE CHEST - 1 VIEW  COMPARISON:  September 23, 2013  FINDINGS: There is no edema or consolidation. Heart is mildly enlarged with normal pulmonary vascularity. No adenopathy. No pneumothorax.  IMPRESSION: Mild cardiac enlargement.  No edema or  consolidation.   Electronically Signed   By: Lowella Grip M.D.   On: 10/08/2013 07:49     ASSESSMENT / PLAN:  PULMONARY A: h/o osa  Extubated post-op P:   - Cont O2 and pulse ox - resume CPAP during sleep   CARDIOVASCULAR A:  H/o essential HTN P:  - Tele monitoring. - PRN hydralazine for SBP >160.  RENAL A:   hyponatremia -resolved >on HCTZ P:   - F/u am chemistry.  GASTROINTESTINAL A:   H/o esophageal stricture w/ dilation (remote) Dysphagia risk P:   - Swallow evaluation now that more awake  HEMATOLOGIC A:  No acute  P:  - Trend cbc. - SCDs, no heparin.   INFECTIOUS A:  No evidence of infection  P:   - Trend fever and wbc curve, hold off abx.  ENDOCRINE A:   DM type II w/ hyperglycemia  Hypothyroidism  Prednisone  for recent gout flare  P:   - SSI. - Replace synthroid. - dc prednisone 3/22  NEUROLOGIC A:   S/p crani for evac of traumatic left SDH H/o parkinsons  P:   - Per n-surg. - Will need feeding tube if fails swallow evaluation -hope to avoid.   TODAY'S SUMMARY: Mobilise once drain removed, swallow evaluation, resume CPAP for now.   I have personally obtained a history, examined the patient, evaluated laboratory and imaging results, formulated the assessment and plan and placed orders.  CRITICAL CARE: The patient is critically ill with multiple organ systems failure and requires high complexity decision making for assessment and support, frequent evaluation and titration of therapies, application of advanced monitoring technologies and extensive interpretation of multiple databases. Critical Care Time devoted to patient care services described in this note is 31 minutes.   Kara Mead MD. Shade Flood. [facility] Pager [phone] If no response call [phone]

## 2013-10-10 DIAGNOSIS — E1349 Other specified diabetes mellitus with other diabetic neurological complication: Secondary | ICD-10-CM

## 2013-10-10 DIAGNOSIS — E1365 Other specified diabetes mellitus with hyperglycemia: Secondary | ICD-10-CM

## 2013-10-10 LAB — GLUCOSE, CAPILLARY
Glucose-Capillary: 112 mg/dL — ABNORMAL HIGH (ref 70–99)
Glucose-Capillary: 123 mg/dL — ABNORMAL HIGH (ref 70–99)
Glucose-Capillary: 149 mg/dL — ABNORMAL HIGH (ref 70–99)
Glucose-Capillary: 158 mg/dL — ABNORMAL HIGH (ref 70–99)
Glucose-Capillary: 168 mg/dL — ABNORMAL HIGH (ref 70–99)
Glucose-Capillary: 161 mg/dL — ABNORMAL HIGH (ref 70–99)

## 2013-10-10 LAB — BASIC METABOLIC PANEL
BUN: 14 mg/dL (ref 6–23)
CALCIUM: 8.4 mg/dL (ref 8.4–10.5)
CHLORIDE: 99 meq/L (ref 96–112)
CO2: 22 meq/L (ref 19–32)
CREATININE: 0.86 mg/dL (ref 0.50–1.35)
GFR calc Af Amer: >90 mL/min (ref >90)
GFR calc non Af Amer: 81 mL/min — ABNORMAL LOW (ref >90)
Glucose, Bld: 123 mg/dL — ABNORMAL HIGH (ref 70–99)
Potassium: 3.8 mEq/L (ref 3.7–5.3)
Sodium: 134 mEq/L — ABNORMAL LOW (ref 137–147)

## 2013-10-10 LAB — CBC
HCT: 38.7 % — ABNORMAL LOW (ref 39.0–52.0)
Hemoglobin: 13 g/dL (ref 13.0–17.0)
MCH: 28.4 pg (ref 26.0–34.0)
MCHC: 33.6 g/dL (ref 30.0–36.0)
MCV: 84.5 fL (ref 78.0–100.0)
PLATELETS: 295 10*3/uL (ref 150–400)
RBC: 4.58 MIL/uL (ref 4.22–5.81)
RDW: 15.8 % — AB (ref 11.5–15.5)
WBC: 17.9 10*3/uL — ABNORMAL HIGH (ref 4.0–10.5)

## 2013-10-10 MED ORDER — INSULIN ASPART 100 UNIT/ML ~~LOC~~ SOLN
0.0000 [IU] | Freq: Three times a day (TID) | SUBCUTANEOUS | Status: DC
  Administered 2013-10-10: 3 [IU] via SUBCUTANEOUS
  Administered 2013-10-10: 4 [IU] via SUBCUTANEOUS
  Administered 2013-10-11 (×2): 3 [IU] via SUBCUTANEOUS
  Administered 2013-10-12 – 2013-10-13 (×5): 4 [IU] via SUBCUTANEOUS

## 2013-10-10 NOTE — Evaluation (Signed)
Physical Therapy Evaluation Patient Details Name: Joshua Glass MRN: [id number] DOB: 04-Jan-1933 Today's Date: 10/10/2013 Time: 0258-5277 PT Time Calculation (min): 17 min  PT Assessment / Plan / Recommendation History of Present Illness  This is a 80 year old male s/p left crani for traumatic left SDH on 3/19. PMH -OSA on cpap, diabetes mellitus with diabetic peripheral neuropathy, atrial fibrillation not on anticoagulation, Parkinson's disease, and hypertension, baseline ambulates with walker, h/o  falls   Clinical Impression  Pt adm due to the above. Presents with limitations in functional mobility and is a fall risk. Pt to benefit from skilled acute PT to address deficits indicated below and maximize mobility prior to D/C. Pt and wife hopeful for D/C home with HHPT. Will cont to assess D/C disposition as mobility is progressed.     PT Assessment  Patient needs continued PT services    Follow Up Recommendations  Home health PT;Supervision/Assistance - 24 hour    Does the patient have the potential to tolerate intense rehabilitation      Barriers to Discharge        Equipment Recommendations  None recommended by PT    Recommendations for Other Services OT consult   Frequency Min 4X/week    Precautions / Restrictions Precautions Precautions: Fall Precaution Comments: wife reports pt has fallen multiple times at home Restrictions Weight Bearing Restrictions: No   Pertinent Vitals/Pain Vitals stable. No c/o pain.       Mobility  Bed Mobility Overal bed mobility: Needs Assistance;+2 for physical assistance Bed Mobility: Supine to Sit Supine to sit: +2 for physical assistance;Mod assist General bed mobility comments: pt able to (A) with trunk elevation by using Rt arm to pull up on handrails; pt does require 2 person (A) to acheive sitting and bring LEs off bed; max cues for sequencing  Transfers Overall transfer level: Needs assistance Equipment used: 2 person hand held  assist Transfers: Sit to/from Stand;Stand Pivot Transfers Sit to Stand: +2 physical assistance;Min assist Stand pivot transfers: +2 physical assistance;Mod assist General transfer comment: pt with fwd flexed posture and shaky LEs; 2 person (A) to maintain balance with use of gt belt and facilitation to perform pivotal steps to chair; short shuffled parkinsonian steps; cues for sequencing          PT Diagnosis: Difficulty walking;Abnormality of gait;Generalized weakness;Acute pain  PT Problem List: Decreased strength;Decreased range of motion;Decreased activity tolerance;Decreased balance;Decreased mobility;Decreased cognition;Decreased knowledge of use of DME;Decreased safety awareness;Pain PT Treatment Interventions: DME instruction;Gait training;Stair training;Functional mobility training;Therapeutic activities;Therapeutic exercise;Balance training;Neuromuscular re-education;Cognitive remediation;Patient/family education;Modalities     PT Goals(Current goals can be found in the care plan section) Acute Rehab PT Goals Patient Stated Goal: to go home with his wife.   PT Goal Formulation: With patient/family Time For Goal Achievement: 10/24/13 Potential to Achieve Goals: Good  Visit Information  Last PT Received On: 10/10/13 Assistance Needed: +2 History of Present Illness: This is a 80 year old male s/p left crani for traumatic left SDH on 3/19. PMH -OSA on cpap, diabetes mellitus with diabetic peripheral neuropathy, atrial fibrillation not on anticoagulation, Parkinson's disease, and hypertension, baseline ambulates with walker, h/o  falls        Prior [place] expects to be discharged to:: Private residence Living Arrangements: Spouse/significant other Available Help at Discharge: Family;Available 24 hours/day Type of Home: House Home Access: Stairs to enter [employer] of Steps: 2 Entrance Stairs-Rails: Left Home Layout: Two level;Able  to live on main level  with bedroom/bathroom Home Equipment: Walker - 2 wheels;Cane - single point;Bedside commode;Grab bars - toilet;Grab bars - tub/shower;Shower seat;Hand held Hotel manager Prior Function Level of Independence: Needs assistance Gait / Transfers Assistance Needed: pt ambulates with RW at all times; PTA pt was ambulating at supervision level  ADL's / Homemaking Assistance Needed: wife helps with dressing.  Communication Communication: No difficulties Dominant Hand: Right    Cognition  Cognition Arousal/Alertness: Awake/alert Behavior During Therapy: WFL for tasks assessed/performed Overall Cognitive Status: Impaired/Different from baseline Area of Impairment: Problem solving Problem Solving: Slow processing;Decreased initiation;Difficulty sequencing;Requires verbal cues;Requires tactile cues General Comments: pt with delayed responses but was able to resond appropriately with incr time    Extremity/Trunk Assessment Upper Extremity Assessment Upper Extremity Assessment: Defer to OT evaluation (Lt limited at baseline ) Lower Extremity Assessment Lower Extremity Assessment: Generalized weakness Cervical / Trunk Assessment Cervical / Trunk Assessment: Other exceptions Cervical / Trunk Exceptions: forward head, rounded shoulders.   Balance Balance Overall balance assessment: History of Falls;Needs assistance Sitting-balance support: Feet supported;No upper extremity supported Sitting balance-Leahy Scale: Fair Sitting balance - Comments: fwd head and rounded shoulders; cues to look upright  Postural control: Posterior lean Standing balance support: During functional activity;Bilateral upper extremity supported Standing balance-Leahy Scale: Zero Standing balance comment: 2 person (A) to maintain balance; posterior sway with fwd flex trunk General Comments General comments (skin integrity, edema, etc.): surgical incision at craniotmy site; pt was able to  track pen with eyes;   End of Session PT - End of Session Equipment Utilized During Treatment: Gait belt;Oxygen Activity Tolerance: Patient tolerated treatment well Patient left: in chair;with call bell/phone within reach;with family/visitor present Nurse Communication: Mobility status  GP     Gustavus Bryant, [place] 10/10/2013, 4:12 PM

## 2013-10-10 NOTE — Progress Notes (Signed)
PULMONARY / CRITICAL CARE MEDICINE   Name: Joshua Glass MRN: [id number] DOB: [birth year]    ADMISSION DATE:  10/07/2013 CONSULTATION DATE:  3/20  REFERRING MD :  Vertell Limber PRIMARY SERVICE: stern   CHIEF COMPLAINT:  Post-op med management   BRIEF PATIENT DESCRIPTION:  This is a 80 year old male s/p left crani for traumatic left SDH on 3/19. PCCM asked to eval post-op to assist with medical management.   PMH -OSA on cpap, diabetes mellitus with diabetic peripheral neuropathy, atrial fibrillation not on anticoagulation, Parkinson's disease, and hypertension, baseline ambulates with walker, h/o  falls and head trauma 1 week ago and again 3 days ago.   SIGNIFICANT EVENTS / STUDIES:  CT head 3/19: Acute subdural hematoma on the left measuring 12.4 mm. Localized mass effect without significant midline shift.  LINES / TUBES: ETT 3/19 >> 3/19  CULTURES: None  ANTIBIOTICS: None  SUBJECTIVE:  Afebrile Good UO  VITAL SIGNS: Temp:  [97.7 F (36.5 C)-98.4 F (36.9 C)] 97.7 F (36.5 C) (03/22 0740) Pulse Rate:  [67-71] 70 (03/22 0700) Resp:  [13-31] 18 (03/22 0700) BP: (138-163)/(51-71) 138/61 mmHg (03/22 0700) SpO2:  [91 %-98 %] 97 % (03/22 0700) Weight:  [105 kg (231 lb 7.7 oz)] 105 kg (231 lb 7.7 oz) (03/21 1700) HEMODYNAMICS:   VENTILATOR SETTINGS:   INTAKE / OUTPUT: Intake/Output     03/21 0701 - 03/22 0700 03/22 0701 - 03/23 0700   P.O. 1230    I.V. (mL/kg) 1100 (10.5) 50 (0.5)   IV Piggyback 210    Total Intake(mL/kg) 2540 (24.2) 50 (0.5)   Urine (mL/kg/hr) 3475 (1.4)    Drains     Total Output 3475     Net -935 +50        Urine Occurrence 1 x     PHYSICAL EXAMINATION: General:  elderly, appears chronically ill Neuro:  Awake, alert, follows commands, slow speech, right side weaker  HEENT:  Left JP bulb - bloody drainage. Dressing intact. Diffuse ecchymosis over face and neck  Cardiovascular: RRR, Nl S1/S2, -M/R/G. Lungs: Decreased, no accessory muscle use,  transmittable upper airway sounds. Abdomen:  Obese + bowel sounds  Musculoskeletal:  Intact  Skin:  Dry and intact   LABS:  CBC  Recent Labs Lab 10/07/13 2021 10/07/13 2039 10/09/13 0621 10/10/13 0442  WBC 18.3*  --  19.4* 17.9*  HGB 14.9 15.6 13.1 13.0  HCT 42.0 46.0 39.3 38.7*  PLT 342  --  327 295   Coag's  Recent Labs Lab 10/07/13 2021  APTT 27  INR 1.00   BMET  Recent Labs Lab 10/07/13 2021 10/07/13 2039 10/09/13 0621 10/10/13 0256  NA 134* 135* 144 134*  K 4.1 4.1 3.9 3.8  CL 93* 98 107 99  CO2 25  --  25 22  BUN 21 22 16 14   CREATININE 0.87 0.90 0.94 0.86  GLUCOSE 429* 423* 141* 123*   Electrolytes  Recent Labs Lab 10/07/13 2021 10/09/13 0621 10/10/13 0256  CALCIUM 9.2 8.6 8.4  MG  --  2.0  --   PHOS  --  2.8  --    Sepsis Markers No results found for this basename: LATICACIDVEN, PROCALCITON, O2SATVEN,  in the last 168 hours ABG No results found for this basename: PHART, PCO2ART, PO2ART,  in the last 168 hours Liver Enzymes  Recent Labs Lab 10/07/13 2021  AST 32  ALT 37  ALKPHOS 105  BILITOT 0.9  ALBUMIN 3.6   Cardiac Enzymes No  results found for this basename: TROPONINI, PROBNP,  in the last 168 hours Glucose  Recent Labs Lab 10/09/13 0335 10/09/13 0754 10/09/13 1127 10/09/13 1611 10/09/13 1957 10/10/13 0408  GLUCAP 118* 116* 149* 171* 199* 112*    Imaging Ct Head Wo Contrast  10/08/2013   CLINICAL DATA:  Subdural hematoma post drainage  EXAM: CT HEAD WITHOUT CONTRAST  TECHNIQUE: Contiguous axial images were obtained from the base of the skull through the vertex without intravenous contrast.  COMPARISON:  CT head 10/07/2013  FINDINGS: Interval craniotomy left frontal bone for subdural drainage. There is a scalp drain on the left which does not extend into the intracranial space.  There is satisfactory drainage of left-sided subdural hematoma. Small amount of residual high density subdural blood is present as well as a  small amount of subdural gas on the left. Improvement in mass effect on the left hemisphere. Mild midline shift 2 mm unchanged.  Generalized atrophy. Chronic microvascular ischemic change in the white matter.  Mucosal edema left maxillary sinus remains.  IMPRESSION: Satisfactory subdural drainage on the left. Small amount of residual subdural blood and gas process on the left. Improvement in mass effect.   Electronically Signed   By: Franchot Gallo M.D.   On: 10/08/2013 09:54   Dg Chest Port 1 View  10/09/2013   CLINICAL DATA:  Respiratory failure  EXAM: PORTABLE CHEST - 1 VIEW  COMPARISON:  DG CHEST 1V PORT dated 10/08/2013  FINDINGS: Low lung volumes. The cardiac silhouette is enlarged. Both lungs are clear. The visualized skeletal structures are unremarkable.  IMPRESSION: No active disease.   Electronically Signed   By: Margaree Mackintosh M.D.   On: 10/09/2013 07:55     ASSESSMENT / PLAN:  PULMONARY A: h/o osa  Extubated post-op P:   -  CPAP during sleep   CARDIOVASCULAR A:  H/o essential HTN P:  - Tele monitoring. - PRN hydralazine for SBP >160.  RENAL A:   hyponatremia -resolved >on HCTZ P:   - F/u am chemistry.  GASTROINTESTINAL A:   H/o esophageal stricture w/ dilation (remote) Dysphagia risk P:   - advance PO  HEMATOLOGIC A:  No acute  P:  -  SCDs, no heparin.   INFECTIOUS A: Leucocytosis,No evidence of infection  P:   - Trend fever and wbc curve, hold off abx.  ENDOCRINE A:   DM type II w/ hyperglycemia  Hypothyroidism  Prednisone for recent gout flare  P:   - SSI. - Replace synthroid. - dc prednisone 3/22  NEUROLOGIC A:   S/p crani for evac of traumatic left SDH H/o parkinsons  P:   - Per n-surg. -CIR -keppra short term per neuro -sinemet to home dose  TODAY'S SUMMARY: Mobilise , resume CPAP , can transfer out  PCCM to sign off  Kara Mead MD. Shade Flood. [place] [facility] Pager [phone] If no response call [phone]

## 2013-10-10 NOTE — Progress Notes (Signed)
Patient ID: Joshua Glass, male   DOB: 11/29/1932, 80 y.o.   MRN: [id number] Patient looks better today. He is a little more weight. He is eating breakfast. He is using his right arm well to eat.Marland Kitchen His incision looks good. He can state his name, place, name a watch and a pen, and name the Pres. I am pleased with his progress.

## 2013-10-10 NOTE — Plan of Care (Signed)
Problem: Consults Goal: Diagnosis - Craniotomy Outcome: Completed/Met Date Met:  10/10/13 Left subdural hematoma

## 2013-10-11 DIAGNOSIS — G20 Parkinson's disease: Secondary | ICD-10-CM

## 2013-10-11 DIAGNOSIS — W19XXXA Unspecified fall, initial encounter: Secondary | ICD-10-CM

## 2013-10-11 DIAGNOSIS — S065X9A Traumatic subdural hemorrhage with loss of consciousness of unspecified duration, initial encounter: Secondary | ICD-10-CM

## 2013-10-11 DIAGNOSIS — S065XAA Traumatic subdural hemorrhage with loss of consciousness status unknown, initial encounter: Secondary | ICD-10-CM

## 2013-10-11 LAB — GLUCOSE, CAPILLARY
GLUCOSE-CAPILLARY: 135 mg/dL — AB (ref 70–99)
GLUCOSE-CAPILLARY: 140 mg/dL — AB (ref 70–99)
GLUCOSE-CAPILLARY: 118 mg/dL — AB (ref 70–99)
Glucose-Capillary: 187 mg/dL — ABNORMAL HIGH (ref 70–99)

## 2013-10-11 MED ORDER — CARBIDOPA-LEVODOPA 25-100 MG PO TABS: 1.5000 | ORAL_TABLET | Freq: Three times a day (TID) | ORAL | Status: DC

## 2013-10-11 MED ORDER — LEVETIRACETAM 500 MG PO TABS
500.0000 mg | ORAL_TABLET | Freq: Two times a day (BID) | ORAL | Status: DC
  Administered 2013-10-11 – 2013-10-13 (×5): 500 mg via ORAL
  Filled 2013-10-11 (×6): qty 1

## 2013-10-11 MED ORDER — CARBAMIDE PEROXIDE 6.5 % OT SOLN
10.0000 [drp] | Freq: Two times a day (BID) | OTIC | Status: DC
  Administered 2013-10-12 – 2013-10-13 (×4): 10 [drp] via OTIC
  Filled 2013-10-11 (×2): qty 15
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
## 2013-10-11 NOTE — Progress Notes (Signed)
Rehab admissions - I met with pt and explained the possibility of inpatient rehab. Pt was delayed in responding to my questions and was often staring around the room. I then called and spoke with his wife Izora Gala by phone. She is interested in pursuing inpatient rehab and would like me to open the case with his [employer] insurance. Informational brochures were left in pt's room.  I explained that rehab MD, Dr. Naaman Plummer, was concerned about pt's low level of activity tolerance and that we would need insurance approval to proceed. In addition, I explained that his possible admission is dependent on bed availability in CIR. I also gave update to [place], PT who was about to see the pt.   I will now open the case with [employer] and keep the pt/family and medical team aware of any updates. Please call me with any questions.  Thanks.  Nanetta Batty, PT Rehabilitation Admissions Coordinator [phone]

## 2013-10-11 NOTE — Progress Notes (Signed)
Physical Therapy Treatment Patient Details Name: Joshua Glass MRN: [id number] DOB: 17-Oct-1932 Today's Date: 10/11/2013 Time: 8099-8338 PT Time Calculation (min): 25 min  PT Assessment / Plan / Recommendation  History of Present Illness This is a 80 year old male s/p left crani for traumatic left SDH on 3/19. PMH -OSA on cpap, diabetes mellitus with diabetic peripheral neuropathy, atrial fibrillation not on anticoagulation, Parkinson's disease, and hypertension, baseline ambulates with walker, h/o  falls    PT Comments   Pt adm with the above dx. Pt's current functional mobility is not safe to return home upon d/c. Pt is a good candidate for CIR to maximize mobility for safe return home. Pt to benefit from skilled acute PT to address limitation listed below.   Follow Up Recommendations  CIR;Supervision/Assistance - 24 hour     Does the patient have the potential to tolerate intense rehabilitation     Barriers to Discharge        Equipment Recommendations  None recommended by PT    Recommendations for Other Services OT consult;Rehab consult  Frequency Min 4X/week   Progress towards PT Goals Progress towards PT goals: Progressing toward goals  Plan Discharge plan needs to be updated    Precautions / Restrictions Precautions Precautions: Fall Precaution Comments: hx of multiple falls Restrictions Weight Bearing Restrictions: No   Pertinent Vitals/Pain Pt reports no pain    Mobility  Bed Mobility Overal bed mobility: Needs Assistance Bed Mobility: Supine to Sit Supine to sit: Max assist Sit to supine: +2 for physical assistance;Mod assist General bed mobility comments: HOB raised hand held support to pullself up Transfers Overall transfer level: Needs assistance Equipment used: 2 person hand held assist;Rolling walker (2 wheeled) Transfers: Sit to/from Stand Sit to Stand: +2 physical assistance;Min assist General transfer comment: pt with flexed trunk posture. blocked  Bilat knees. pt req RW for standing balance support  Ambulation/Gait Ambulation/Gait assistance: +2 physical assistance;Mod assist Ambulation Distance (Feet): 50 Feet (x2, with seated rest break in between) Assistive device: Rolling walker (2 wheeled) Gait Pattern/deviations: Step-through pattern;Shuffle;Decreased stride length;Trunk flexed;Decreased weight shift to right;Festinating Gait velocity: pt's speed inc and req max A, verbal and tactile cues to slow down to a safe speed.  General Gait Details: pt's impulsive inc in speedputs him at an inc risk for falls. forward trunk flexion. pt req verbal and tactile cues to keep hips forward, facilitating hip extension, to maintain an upright posture    Exercises     PT Diagnosis:    PT Problem List:   PT Treatment Interventions:     PT Goals (current goals can now be found in the care plan section) Acute Rehab PT Goals Patient Stated Goal: to go home with his wife.   PT Goal Formulation: With patient Time For Goal Achievement: 10/24/13 Potential to Achieve Goals: Good  Visit Information  Last PT Received On: 10/11/13 Assistance Needed: +2 History of Present Illness: This is a 80 year old male s/p left crani for traumatic left SDH on 3/19. PMH -OSA on cpap, diabetes mellitus with diabetic peripheral neuropathy, atrial fibrillation not on anticoagulation, Parkinson's disease, and hypertension, baseline ambulates with walker, h/o  falls     Subjective Data  Patient Stated Goal: to go home with his wife.     Cognition  Cognition Arousal/Alertness: Lethargic Behavior During Therapy: Flat affect Overall Cognitive Status: Impaired/Different from baseline Area of Impairment: Following commands;Safety/judgement;Problem solving Following Commands: Follows one step commands with increased time Safety/Judgement: Decreased awareness of  safety Problem Solving: Slow processing;Decreased initiation;Difficulty sequencing;Requires verbal  cues;Requires tactile cues General Comments: pt had delayed response to commands but was able to respond appropriately with inc time    Balance  Balance Overall balance assessment: Needs assistance Sitting-balance support: Feet supported Sitting balance-Leahy Scale: Fair Sitting balance - Comments: flexed trunk Standing balance support: Bilateral upper extremity supported Standing balance-Leahy Scale: Poor Standing balance comment: pt intially LOB with L lateral lean req mod A to correct posture. pt req RW for UE support. pt req tactile cues to correct L lat lean. pt demonstrated trunk flexion in standing req tactile and verbal cues to achieve trunk and hip extension to maintain upright standing position General Comments General comments (skin integrity, edema, etc.): surgical incision at craniotomy site  End of Session PT - End of Session Equipment Utilized During Treatment: Gait belt (RW) Activity Tolerance: Patient tolerated treatment well Patient left: in chair;with call bell/phone within reach;with nursing/sitter in room Nurse Communication: Mobility status   GP     Kohler,Andrew SPT 10/11/2013, 5:03 PM  Agree with above assessment.  [name], PT, DPT Pager #: [phone] Office #: [phone]

## 2013-10-11 NOTE — Progress Notes (Signed)
Physical Therapy Treatment Patient Details Name: Joshua Glass MRN: [id number] DOB: April 17, 1933 Today's Date: 10/11/2013 Time: 0160-1093 PT Time Calculation (min): 25 min  PT Assessment / Plan / Recommendation  History of Present Illness This is a 80 year old male s/p left crani for traumatic left SDH on 3/19. PMH -OSA on cpap, diabetes mellitus with diabetic peripheral neuropathy, atrial fibrillation not on anticoagulation, Parkinson's disease, and hypertension, baseline ambulates with walker, h/o  falls    PT Comments   Pt adm with the above dx. Pt's current functional mobility is not safe to return home upon d/c. Pt is a good candidate for CIR to maximize mobility for safe return home. Pt to benefit from skilled acute PT to address limitation listed below.   Follow Up Recommendations  CIR;Supervision/Assistance - 24 hour     Does the patient have the potential to tolerate intense rehabilitation     Barriers to Discharge        Equipment Recommendations  None recommended by PT    Recommendations for Other Services OT consult;Rehab consult  Frequency Min 4X/week   Progress towards PT Goals Progress towards PT goals: Progressing toward goals  Plan Discharge plan needs to be updated    Precautions / Restrictions Precautions Precautions: Fall Precaution Comments: hx of multiple falls Restrictions Weight Bearing Restrictions: No   Pertinent Vitals/Pain Pt reports no pain    Mobility  Bed Mobility Overal bed mobility: Needs Assistance Bed Mobility: Supine to Sit Supine to sit: Max assist Sit to supine: +2 for physical assistance;Mod assist General bed mobility comments: HOB raised hand held support to pullself up Transfers Overall transfer level: Needs assistance Equipment used: 2 person hand held assist;Rolling walker (2 wheeled) Transfers: Sit to/from Stand Sit to Stand: +2 physical assistance;Min assist General transfer comment: pt with flexed trunk posture. blocked  Bilat knees. pt req RW for standing balance support  Ambulation/Gait Ambulation/Gait assistance: +2 physical assistance;Mod assist Ambulation Distance (Feet): 50 Feet (x2, with seated rest break in between) Assistive device: Rolling walker (2 wheeled) Gait Pattern/deviations: Step-through pattern;Shuffle;Decreased stride length;Trunk flexed;Decreased weight shift to right;Festinating Gait velocity: pt's speed inc and req max A, verbal and tactile cues to slow down to a safe speed.  General Gait Details: pt's impulsive inc in speedputs him at an inc risk for falls. forward trunk flexion. pt req verbal and tactile cues to keep hips forward, facilitating hip extension, to maintain an upright posture    Exercises     PT Diagnosis:    PT Problem List:   PT Treatment Interventions:     PT Goals (current goals can now be found in the care plan section) Acute Rehab PT Goals Patient Stated Goal: to go home with his wife.   PT Goal Formulation: With patient Time For Goal Achievement: 10/24/13 Potential to Achieve Goals: Good  Visit Information  Last PT Received On: 10/11/13 Assistance Needed: +2 History of Present Illness: This is a 80 year old male s/p left crani for traumatic left SDH on 3/19. PMH -OSA on cpap, diabetes mellitus with diabetic peripheral neuropathy, atrial fibrillation not on anticoagulation, Parkinson's disease, and hypertension, baseline ambulates with walker, h/o  falls     Subjective Data  Patient Stated Goal: to go home with his wife.     Cognition  Cognition Arousal/Alertness: Lethargic Behavior During Therapy: Flat affect Overall Cognitive Status: Impaired/Different from baseline Area of Impairment: Following commands;Safety/judgement;Problem solving Following Commands: Follows one step commands with increased time Safety/Judgement: Decreased awareness of  safety Problem Solving: Slow processing;Decreased initiation;Difficulty sequencing;Requires verbal  cues;Requires tactile cues General Comments: pt had delayed response to commands but was able to respond appropriately with inc time    Balance  Balance Overall balance assessment: Needs assistance Sitting-balance support: Feet supported Sitting balance-Leahy Scale: Fair Sitting balance - Comments: flexed trunk Standing balance support: Bilateral upper extremity supported Standing balance-Leahy Scale: Poor Standing balance comment: pt intially LOB with L lateral lean req mod A to correct posture. pt req RW for UE support. pt req tactile cues to correct L lat lean. pt demonstrated trunk flexion in standing req tactile and verbal cues to achieve trunk and hip extension to maintain upright standing position General Comments General comments (skin integrity, edema, etc.): surgical incision at craniotomy site  End of Session PT - End of Session Equipment Utilized During Treatment: Gait belt (RW) Activity Tolerance: Patient tolerated treatment well Patient left: in chair;with call bell/phone within reach;with nursing/sitter in room Nurse Communication: Mobility status   GP     Zayed Griffie SPT 10/11/2013, 5:03 PM

## 2013-10-11 NOTE — Progress Notes (Signed)
Subjective: Patient reports improving  Objective: Vital signs in last 24 hours: Temp:  [97.7 F (36.5 C)-101 F (38.3 C)] 97.7 F (36.5 C) (03/23 0700) Pulse Rate:  [57-101] 70 (03/23 0700) Resp:  [16-24] 20 (03/23 0700) BP: (130-175)/(50-71) 146/58 mmHg (03/23 0700) SpO2:  [90 %-99 %] 97 % (03/23 0700)  Intake/Output from previous day: 03/22 0701 - 03/23 0700 In: 860 [P.O.:600; I.V.:50; IV Piggyback:210] Out: 520 [Urine:520] Intake/Output this shift:    Physical Exam: No weakness.  Spontaneous speech.  Dressing CDI  Lab Results:  Recent Labs  10/09/13 0621 10/10/13 0442  WBC 19.4* 17.9*  HGB 13.1 13.0  HCT 39.3 38.7*  PLT 327 295   BMET  Recent Labs  10/09/13 0621 10/10/13 0256  NA 144 134*  K 3.9 3.8  CL 107 99  CO2 25 22  GLUCOSE 141* 123*  BUN 16 14  CREATININE 0.94 0.86  CALCIUM 8.6 8.4    Studies/Results: No results found.  Assessment/Plan: Excellent progress.  To Floor.  Rehab consult.    LOS: 4 days    Peggyann Shoals, MD 10/11/2013, 7:44 AM

## 2013-10-11 NOTE — Progress Notes (Signed)
Pt not currently wanting to wear CPAP at this time.  Pt currently on RA and in no distress.

## 2013-10-12 LAB — GLUCOSE, CAPILLARY
GLUCOSE-CAPILLARY: 152 mg/dL — AB (ref 70–99)
GLUCOSE-CAPILLARY: 163 mg/dL — AB (ref 70–99)
Glucose-Capillary: 155 mg/dL — ABNORMAL HIGH (ref 70–99)
Glucose-Capillary: 159 mg/dL — ABNORMAL HIGH (ref 70–99)

## 2013-10-12 NOTE — Progress Notes (Signed)
Physical Therapy Treatment Patient Details Name: Joshua Glass MRN: [id number] DOB: [birth year] Today's Date: 10/12/2013    History of Present Illness This is a 80 year old male s/p left crani for traumatic left SDH on 3/19. PMH -OSA on cpap, diabetes mellitus with diabetic peripheral neuropathy, atrial fibrillation not on anticoagulation, Parkinson's disease, and hypertension, baseline ambulates with walker, h/o  falls     PT Comments    Patient continues to required assist for mobility. Performed some in room ambulation.  Patient initially lethargic but became much more aroused with activity and when seated in chair.  Will continue to see and progress activity as tolerated.   Follow Up Recommendations  CIR;Supervision/Assistance - 24 hour     Equipment Recommendations  None recommended by PT    Recommendations for Other Services OT consult;Rehab consult     Precautions / Restrictions Precautions Precautions: Fall Precaution Comments: hx of multiple falls Restrictions Weight Bearing Restrictions: No    Mobility  Bed Mobility Overal bed mobility: Needs Assistance Bed Mobility: Supine to Sit     Supine to sit: Max assist     General bed mobility comments: HOB raised hand held support to pullself up  Transfers Overall transfer level: Needs assistance Equipment used: 2 person hand held assist Transfers: Sit to/from Stand Sit to Stand: +2 physical assistance;Min assist         General transfer comment: assist to come to upright with multi modal cues and therapeutic positioning on each side of patient (+2).  Patient able to stand without knee blocking today.   Ambulation/Gait Ambulation/Gait assistance: +2 physical assistance;Mod assist Ambulation Distance (Feet): 18 Feet Assistive device: 2 person hand held assist Gait Pattern/deviations: Step-through pattern;Shuffle;Decreased stride length;Trunk flexed;Decreased weight shift to right;Festinating Gait velocity:  pt's speed inc and req max A, verbal and tactile cues to slow down to a safe speed.    General Gait Details: increased cues for step length as well as moderate assist to faciliate lateral weight shift.   Stairs            Wheelchair Mobility    Modified Rankin (Stroke Patients Only)       Balance     Sitting balance-Leahy Scale: Fair Sitting balance - Comments: flexed trunk     Standing balance-Leahy Scale: Poor                      Cognition Arousal/Alertness: Lethargic Behavior During Therapy: Flat affect Overall Cognitive Status: Impaired/Different from baseline Area of Impairment: Following commands;Safety/judgement;Problem solving       Following Commands: Follows one step commands with increased time Safety/Judgement: Decreased awareness of safety   Problem Solving: Slow processing;Decreased initiation;Difficulty sequencing;Requires verbal cues;Requires tactile cues General Comments: patient oriented, able to respond with one word responses, does not initiate conversation or respond with sentences    Exercises      General Comments General comments (skin integrity, edema, etc.): patient +BM and urine when enterred the room.  Hygiene and pericare performed with patient.  Patient assisted with bed mobility and rolling during hygiene.       Pertinent Vitals/Pain NAD           PT Goals (current goals can now be found in the care plan section) Acute Rehab PT Goals Patient Stated Goal: to go home with his wife.   PT Goal Formulation: With patient Time For Goal Achievement: 10/24/13 Potential to Achieve Goals: Good Progress towards PT goals: Progressing toward goals (modestly)  Frequency  Min 4X/week    PT Plan Discharge plan needs to be updated    End of Session Equipment Utilized During Treatment: Gait belt Activity Tolerance: Patient tolerated treatment well Patient left: in chair;with call bell/phone within reach;with chair alarm  set;with family/visitor present     Time: 3846-6599 PT Time Calculation (min): 24 min  Charges:  $Gait Training: 8-22 mins $Therapeutic Activity: 8-22 mins                    G CodesDuncan Dull 11-11-2013, 1:29 PM Alben Deeds, [place] DPT  [phone]

## 2013-10-12 NOTE — Progress Notes (Signed)
LATE ENTRY FROM 3/21  Speech Language Pathology  Patient Details Name: Joshua Glass MRN: [id number] DOB: 05/30/1933 Today's Date: 10/12/2013 Time:  -     Late entry note from 3/21.  SLP returned in morning to perform bedside swallow assessment, however MD cancelled order.  SLP asked RN to request new order if pt. Exhibits difficulty.  Orbie Pyo Lanare.[employer] [phone]  10/12/2013

## 2013-10-12 NOTE — Progress Notes (Signed)
Pt had a temp of 101.3 at [place], Dr Ellene Route (on call) paged at [facility], waited for call back, temp re-checked at 0336,read 100.8, Dr Ellene Route re-paged, called back and said to just continue to monitor pt,tab tylenol 650mg  was however given at 0404,pt reassured,will continue to monitor. [place]-Asidi, Maniah Nading Efe

## 2013-10-12 NOTE — Progress Notes (Signed)
Rehab admissions - I spoke with speech therapist to clarify if pt had speech/swallow eval done and per Lattie Haw, the original speech order was cancelled. I then called and left message with Dr. Melven Sartorius office to ask for new order for Speech swallow/cognition eval as insurance was requesting their evaluation. Noted that our rehab MD had intended goals for SLP as part of the inpatient rehab care plan.  I then received insurance approval from his [place] [employer] to possibly admit pt on 10-13-13 (pending bed availability) and Ria Comment, Rn case Freight forwarder with [employer] requested speech notes to be faxed to her when completed.   I informed pt/his wife about these updates and clearly indicated that although we have insurance approval, his potential admission to inpatient rehab is dependent on bed availability. We are very tight on bed availability.  I will check on pt status tomorrow and check out our bed availability tomorrow in AM as well. Updated Hassan Rowan case Freight forwarder and Raquel Sarna, Education officer, museum as well.   Please call me with any questions. Thanks.  Nanetta Batty, PT Rehabilitation Admissions Coordinator [phone]  I

## 2013-10-12 NOTE — Progress Notes (Signed)
Good Rehab candidate.  Awaiting word on disposition.
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
## 2013-10-12 NOTE — Clinical Social Work Note (Signed)
CSW received call from [facility] admissions coordinator stating that pt has been authorized by insurance for CIR placement, however, CIR is unsure about bed availability on the day of discharge. CSW informed CIR admissions coordinator that the pt's particular type of insurance will not allow CSW to receive authorization for CIR and SNF simultaneously. CIR admissions coordinator understood and CSW and CIR admissions coordinator agree for CSW to follow pt closely in the even that pt is not able to be admitted to CIR.   CSW to speak with pt's wife regarding SNF placement as a back-up plan, but will wait until final decision made on CIR placement before beginning insurance authorization for SNF placement.  Pati Gallo, [place] Social Worker [phone]

## 2013-10-12 NOTE — Progress Notes (Signed)
Subjective: Patient reports "Ok"   Objective: Vital signs in last 24 hours: Temp:  [97.6 F (36.4 C)-101.3 F (38.5 C)] 98.1 F (36.7 C) (03/24 0931) Pulse Rate:  [64-94] 64 (03/24 0931) Resp:  [17-26] 24 (03/24 0931) BP: (142-169)/(53-75) 147/53 mmHg (03/24 0931) SpO2:  [94 %-98 %] 95 % (03/24 0931)  Intake/Output from previous day: 03/23 0701 - 03/24 0700 In: 600 [P.O.:600] Out: -  Intake/Output this shift: Total I/O In: 240 [P.O.:240] Out: -   Awake, responding to questions, but not offering conversation. Following commands without hesitation. LUE weakness, decreased ROM. PEARL. Incision with staples, no erythema, swelling, or drainage.   Lab Results:  Recent Labs  10/10/13 0442  WBC 17.9*  HGB 13.0  HCT 38.7*  PLT 295   BMET  Recent Labs  10/10/13 0256  NA 134*  K 3.8  CL 99  CO2 22  GLUCOSE 123*  BUN 14  CREATININE 0.86  CALCIUM 8.4    Studies/Results: No results found.  Assessment/Plan: Improving   LOS: 5 days  CIR if approved by insurance. Continue therapies. Per DrStern will request swallow study.    Verdis Prime 10/12/2013, 11:13 AM

## 2013-10-13 ENCOUNTER — Inpatient Hospital Stay (HOSPITAL_COMMUNITY)
Admission: RE | Admit: 2013-10-13 | Discharge: 2013-10-28 | DRG: 945 | Disposition: A | Discharge status: Skilled Nursing Facility | Payer: Medicare Other | Source: Intra-hospital | Attending: Physical Medicine & Rehabilitation | Admitting: Physical Medicine & Rehabilitation

## 2013-10-13 DIAGNOSIS — Z7982 Long term (current) use of aspirin: Secondary | ICD-10-CM

## 2013-10-13 DIAGNOSIS — S065XAA Traumatic subdural hemorrhage with loss of consciousness status unknown, initial encounter: Secondary | ICD-10-CM | POA: Diagnosis present

## 2013-10-13 DIAGNOSIS — Z5189 Encounter for other specified aftercare: Secondary | ICD-10-CM | POA: Diagnosis present

## 2013-10-13 DIAGNOSIS — J45909 Unspecified asthma, uncomplicated: Secondary | ICD-10-CM | POA: Diagnosis present

## 2013-10-13 DIAGNOSIS — E785 Hyperlipidemia, unspecified: Secondary | ICD-10-CM | POA: Diagnosis present

## 2013-10-13 DIAGNOSIS — D72829 Elevated white blood cell count, unspecified: Secondary | ICD-10-CM | POA: Diagnosis present

## 2013-10-13 DIAGNOSIS — W19XXXA Unspecified fall, initial encounter: Secondary | ICD-10-CM | POA: Diagnosis present

## 2013-10-13 DIAGNOSIS — R1310 Dysphagia, unspecified: Secondary | ICD-10-CM

## 2013-10-13 DIAGNOSIS — Z9181 History of falling: Secondary | ICD-10-CM

## 2013-10-13 DIAGNOSIS — S065X9A Traumatic subdural hemorrhage with loss of consciousness of unspecified duration, initial encounter: Secondary | ICD-10-CM | POA: Diagnosis present

## 2013-10-13 DIAGNOSIS — I4891 Unspecified atrial fibrillation: Secondary | ICD-10-CM | POA: Diagnosis present

## 2013-10-13 DIAGNOSIS — E1065 Type 1 diabetes mellitus with hyperglycemia: Secondary | ICD-10-CM

## 2013-10-13 DIAGNOSIS — I10 Essential (primary) hypertension: Secondary | ICD-10-CM | POA: Diagnosis present

## 2013-10-13 DIAGNOSIS — N400 Enlarged prostate without lower urinary tract symptoms: Secondary | ICD-10-CM | POA: Diagnosis present

## 2013-10-13 DIAGNOSIS — R269 Unspecified abnormalities of gait and mobility: Secondary | ICD-10-CM

## 2013-10-13 DIAGNOSIS — G4733 Obstructive sleep apnea (adult) (pediatric): Secondary | ICD-10-CM | POA: Diagnosis present

## 2013-10-13 DIAGNOSIS — E1149 Type 2 diabetes mellitus with other diabetic neurological complication: Secondary | ICD-10-CM | POA: Diagnosis present

## 2013-10-13 DIAGNOSIS — Z79899 Other long term (current) drug therapy: Secondary | ICD-10-CM

## 2013-10-13 DIAGNOSIS — R471 Dysarthria and anarthria: Secondary | ICD-10-CM | POA: Diagnosis present

## 2013-10-13 DIAGNOSIS — E039 Hypothyroidism, unspecified: Secondary | ICD-10-CM | POA: Diagnosis present

## 2013-10-13 DIAGNOSIS — R1312 Dysphagia, oropharyngeal phase: Secondary | ICD-10-CM | POA: Diagnosis present

## 2013-10-13 DIAGNOSIS — E1142 Type 2 diabetes mellitus with diabetic polyneuropathy: Secondary | ICD-10-CM | POA: Diagnosis present

## 2013-10-13 DIAGNOSIS — G20 Parkinson's disease: Secondary | ICD-10-CM | POA: Diagnosis present

## 2013-10-13 DIAGNOSIS — G20A1 Parkinson's disease without dyskinesia, without mention of fluctuations: Secondary | ICD-10-CM | POA: Diagnosis present

## 2013-10-13 DIAGNOSIS — E108 Type 1 diabetes mellitus with unspecified complications: Secondary | ICD-10-CM

## 2013-10-13 DIAGNOSIS — IMO0002 Reserved for concepts with insufficient information to code with codable children: Secondary | ICD-10-CM

## 2013-10-13 DIAGNOSIS — G40909 Epilepsy, unspecified, not intractable, without status epilepticus: Secondary | ICD-10-CM

## 2013-10-13 LAB — GLUCOSE, CAPILLARY
GLUCOSE-CAPILLARY: 164 mg/dL — AB (ref 70–99)
GLUCOSE-CAPILLARY: 131 mg/dL — AB (ref 70–99)
Glucose-Capillary: 118 mg/dL — ABNORMAL HIGH (ref 70–99)
Glucose-Capillary: 124 mg/dL — ABNORMAL HIGH (ref 70–99)

## 2013-10-13 MED ORDER — PANTOPRAZOLE SODIUM 40 MG PO TBEC
40.0000 mg | DELAYED_RELEASE_TABLET | Freq: Every day | ORAL | Status: DC
  Administered 2013-10-13 – 2013-10-22 (×9): 40 mg via ORAL
  Filled 2013-10-13 (×10): qty 1

## 2013-10-13 MED ORDER — LORATADINE 10 MG PO TABS
10.0000 mg | ORAL_TABLET | Freq: Every day | ORAL | Status: DC | PRN
  Filled 2013-10-13: qty 1

## 2013-10-13 MED ORDER — VITAMIN D3 25 MCG (1000 UNIT) PO TABS
2000.0000 [IU] | ORAL_TABLET | Freq: Every day | ORAL | Status: DC
  Administered 2013-10-14 – 2013-10-28 (×15): 2000 [IU] via ORAL
  Filled 2013-10-13 (×17): qty 2

## 2013-10-13 MED ORDER — SORBITOL 70 % SOLN: 30.0000 mL | Freq: Every day | Status: DC | PRN

## 2013-10-13 MED ORDER — INSULIN ASPART 100 UNIT/ML ~~LOC~~ SOLN
0.0000 [IU] | Freq: Three times a day (TID) | SUBCUTANEOUS | Status: DC
  Administered 2013-10-14: 4 [IU] via SUBCUTANEOUS
  Administered 2013-10-14: 7 [IU] via SUBCUTANEOUS
  Administered 2013-10-14 – 2013-10-15 (×2): 4 [IU] via SUBCUTANEOUS
  Administered 2013-10-15 – 2013-10-16 (×2): 3 [IU] via SUBCUTANEOUS
  Administered 2013-10-16: 4 [IU] via SUBCUTANEOUS
  Administered 2013-10-17: 3 [IU] via SUBCUTANEOUS
  Administered 2013-10-17: 7 [IU] via SUBCUTANEOUS
  Administered 2013-10-17 – 2013-10-18 (×3): 3 [IU] via SUBCUTANEOUS
  Administered 2013-10-19: 4 [IU] via SUBCUTANEOUS
  Administered 2013-10-19 – 2013-10-20 (×3): 3 [IU] via SUBCUTANEOUS
  Administered 2013-10-21: 4 [IU] via SUBCUTANEOUS
  Administered 2013-10-21 – 2013-10-22 (×2): 3 [IU] via SUBCUTANEOUS
  Administered 2013-10-22: 4 [IU] via SUBCUTANEOUS
  Administered 2013-10-23: 7 [IU] via SUBCUTANEOUS
  Administered 2013-10-23: 3 [IU] via SUBCUTANEOUS
  Administered 2013-10-24 (×2): 4 [IU] via SUBCUTANEOUS
  Administered 2013-10-25 (×2): 3 [IU] via SUBCUTANEOUS
  Administered 2013-10-25: 4 [IU] via SUBCUTANEOUS
  Administered 2013-10-26: 3 [IU] via SUBCUTANEOUS
  Administered 2013-10-26: 4 [IU] via SUBCUTANEOUS
  Administered 2013-10-26 – 2013-10-27 (×2): 3 [IU] via SUBCUTANEOUS

## 2013-10-13 MED ORDER — LINAGLIPTIN 5 MG PO TABS
5.0000 mg | ORAL_TABLET | Freq: Every day | ORAL | Status: DC
  Administered 2013-10-14 – 2013-10-28 (×15): 5 mg via ORAL
  Filled 2013-10-13 (×17): qty 1

## 2013-10-13 MED ORDER — ALLOPURINOL 300 MG PO TABS
300.0000 mg | ORAL_TABLET | Freq: Every day | ORAL | Status: DC
  Administered 2013-10-14 – 2013-10-28 (×15): 300 mg via ORAL
  Filled 2013-10-13 (×17): qty 1

## 2013-10-13 MED ORDER — SIMVASTATIN 20 MG PO TABS
20.0000 mg | ORAL_TABLET | Freq: Every evening | ORAL | Status: DC
  Administered 2013-10-13 – 2013-10-27 (×15): 20 mg via ORAL
  Filled 2013-10-13 (×16): qty 1

## 2013-10-13 MED ORDER — FUROSEMIDE 20 MG PO TABS
20.0000 mg | ORAL_TABLET | Freq: Every day | ORAL | Status: DC
  Administered 2013-10-14 – 2013-10-28 (×15): 20 mg via ORAL
  Filled 2013-10-13 (×17): qty 1

## 2013-10-13 MED ORDER — CARBAMIDE PEROXIDE 6.5 % OT SOLN
10.0000 [drp] | Freq: Two times a day (BID) | OTIC | Status: DC
  Administered 2013-10-13 – 2013-10-27 (×14): 10 [drp] via OTIC
  Filled 2013-10-13: qty 15

## 2013-10-13 MED ORDER — CLOTRIMAZOLE 1 % EX CREA
TOPICAL_CREAM | Freq: Every day | CUTANEOUS | Status: DC
  Administered 2013-10-24: 21:00:00 via TOPICAL
  Filled 2013-10-13: qty 15

## 2013-10-13 MED ORDER — ONDANSETRON HCL 4 MG PO TABS: 4.0000 mg | ORAL_TABLET | Freq: Four times a day (QID) | ORAL | Status: DC | PRN

## 2013-10-13 MED ORDER — CARBIDOPA-LEVODOPA 25-100 MG PO TABS
1.5000 | ORAL_TABLET | Freq: Three times a day (TID) | ORAL | Status: DC
  Administered 2013-10-14 – 2013-10-28 (×42): 1.5 via ORAL
  Filled 2013-10-13 (×48): qty 1.5

## 2013-10-13 MED ORDER — ACETAMINOPHEN 325 MG PO TABS
650.0000 mg | ORAL_TABLET | ORAL | Status: DC | PRN
  Administered 2013-10-14 – 2013-10-26 (×6): 650 mg via ORAL
  Filled 2013-10-13 (×8): qty 2

## 2013-10-13 MED ORDER — TRIAMCINOLONE ACETONIDE 0.1 % EX CREA
1.0000 | TOPICAL_CREAM | Freq: Every day | CUTANEOUS | Status: DC
Start: 2013-10-14 — End: 2013-10-28
  Administered 2013-10-14 – 2013-10-28 (×7): 1 via TOPICAL
  Filled 2013-10-13: qty 15

## 2013-10-13 MED ORDER — B COMPLEX-C PO TABS
1.0000 | ORAL_TABLET | Freq: Every day | ORAL | Status: DC
  Administered 2013-10-14 – 2013-10-28 (×15): 1 via ORAL
  Filled 2013-10-13 (×16): qty 1

## 2013-10-13 MED ORDER — LEVETIRACETAM 500 MG PO TABS
500.0000 mg | ORAL_TABLET | Freq: Two times a day (BID) | ORAL | Status: DC
  Administered 2013-10-13 – 2013-10-28 (×30): 500 mg via ORAL
  Filled 2013-10-13 (×33): qty 1

## 2013-10-13 MED ORDER — SENNOSIDES-DOCUSATE SODIUM 8.6-50 MG PO TABS: 1.0000 | ORAL_TABLET | Freq: Every evening | ORAL | Status: DC | PRN

## 2013-10-13 MED ORDER — SENNA 8.6 MG PO TABS
1.0000 | ORAL_TABLET | Freq: Two times a day (BID) | ORAL | Status: DC
  Administered 2013-10-13 – 2013-10-28 (×30): 8.6 mg via ORAL
  Filled 2013-10-13 (×35): qty 1

## 2013-10-13 MED ORDER — ASPIRIN EC 81 MG PO TBEC
81.0000 mg | DELAYED_RELEASE_TABLET | Freq: Every day | ORAL | Status: DC
  Administered 2013-10-14 – 2013-10-28 (×15): 81 mg via ORAL
  Filled 2013-10-13 (×17): qty 1

## 2013-10-13 MED ORDER — ACETAMINOPHEN 650 MG RE SUPP: 650.0000 mg | RECTAL | Status: DC | PRN

## 2013-10-13 MED ORDER — BISACODYL 10 MG RE SUPP: 10.0000 mg | Freq: Every day | RECTAL | Status: DC | PRN

## 2013-10-13 MED ORDER — MONTELUKAST SODIUM 10 MG PO TABS
10.0000 mg | ORAL_TABLET | Freq: Every day | ORAL | Status: DC
  Administered 2013-10-13 – 2013-10-27 (×13): 10 mg via ORAL
  Filled 2013-10-13 (×16): qty 1

## 2013-10-13 MED ORDER — AMLODIPINE BESYLATE 10 MG PO TABS
10.0000 mg | ORAL_TABLET | Freq: Every day | ORAL | Status: DC
  Administered 2013-10-14 – 2013-10-28 (×15): 10 mg via ORAL
  Filled 2013-10-13 (×17): qty 1

## 2013-10-13 MED ORDER — LEVOTHYROXINE SODIUM 75 MCG PO TABS
75.0000 ug | ORAL_TABLET | Freq: Every day | ORAL | Status: DC
  Administered 2013-10-14 – 2013-10-28 (×15): 75 ug via ORAL
  Filled 2013-10-13 (×17): qty 1

## 2013-10-13 MED ORDER — TAMSULOSIN HCL 0.4 MG PO CAPS
0.4000 mg | ORAL_CAPSULE | Freq: Every day | ORAL | Status: DC
  Administered 2013-10-14 – 2013-10-28 (×15): 0.4 mg via ORAL
  Filled 2013-10-13 (×17): qty 1

## 2013-10-13 MED ORDER — ONDANSETRON HCL 4 MG/2ML IJ SOLN: 4.0000 mg | Freq: Four times a day (QID) | INTRAMUSCULAR | Status: DC | PRN
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
## 2013-10-13 NOTE — Progress Notes (Signed)
Patient is progressing well 

## 2013-10-13 NOTE — Progress Notes (Signed)
Patient ID: Joshua Glass, male   DOB: 1932-09-01, 80 y.o.   MRN: [id number] Pt admitted to room 4W13 from 4N. Admission vital signs are stables
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
## 2013-10-13 NOTE — Progress Notes (Signed)
Was informed of patients CBG of 163 at 2201 I gave him 4 units of Novo log from his meal coverage sliding scale inadvertently, I realized my error immediately and contacted Dr Annette Stable answering service he called back soon after and gave no follow up orders but CBG was checked again and was 124 patient was not having any ill effects. I will continue to monitor.

## 2013-10-13 NOTE — Progress Notes (Signed)
Physical Therapy Treatment Patient Details Name: Joshua Glass MRN: [id number] DOB: [birth year] Today's Date: 10/13/2013    History of Present Illness This is a 80 year old male s/p left crani for traumatic left SDH on 3/19. PMH -OSA on cpap, diabetes mellitus with diabetic peripheral neuropathy, atrial fibrillation not on anticoagulation, Parkinson's disease, and hypertension, baseline ambulates with walker, h/o  falls     PT Comments    Patient progressing well with mobility today, more engaged and able to mobilize with cues  Follow Up Recommendations  CIR;Supervision/Assistance - 24 hour     Equipment Recommendations  None recommended by PT    Recommendations for Other Services OT consult;Rehab consult     Precautions / Restrictions Precautions Precautions: Fall Precaution Comments: hx of multiple falls    Mobility  Bed Mobility Overal bed mobility: Needs Assistance Bed Mobility: Supine to Sit     Supine to sit: Max assist        Transfers Overall transfer level: Needs assistance Equipment used:  (three musketeer assist) Transfers: Sit to/from Stand Sit to Stand: +2 physical assistance;Mod assist         General transfer comment: facilitating sit - stand with control at hips, knees and trunk. Motor impersistence on R  Ambulation/Gait Ambulation/Gait assistance: +2 physical assistance Ambulation Distance (Feet): 80 Feet Assistive device: 2 person hand held assist Gait Pattern/deviations: Step-through pattern;Shuffle;Decreased stride length;Trunk flexed;Decreased weight shift to right;Festinating Gait velocity: pt's speed inc and req max A, verbal and tactile cues to slow down to a safe speed.    General Gait Details: 3 musketeer approach during ambulation hands on tactile cues for postural correction and lateral weight shifts   Stairs            Wheelchair Mobility    Modified Rankin (Stroke Patients Only)       Balance Overall balance  assessment: Needs assistance Sitting-balance support: Single extremity supported;Feet supported Sitting balance-Leahy Scale: Fair Sitting balance - Comments: flexed trunk Postural control: Left lateral lean Standing balance support: Bilateral upper extremity supported;During functional activity Standing balance-Leahy Scale: Poor Standing balance comment: forward head. ant flexed posture                    Cognition Arousal/Alertness: Lethargic Behavior During Therapy: Flat affect Overall Cognitive Status: Impaired/Different from baseline Area of Impairment: Following commands;Safety/judgement;Problem solving     Memory: Decreased recall of precautions;Decreased short-term memory Following Commands: Follows one step commands with increased time Safety/Judgement: Decreased awareness of safety   Problem Solving: Slow processing;Decreased initiation;Difficulty sequencing;Requires verbal cues;Requires tactile cues General Comments: patient oriented, able to respond with one word responses, does not initiate conversation or respond with sentences    Exercises      General Comments General comments (skin integrity, edema, etc.): patient +BM and urine hygiene performed with patient able to perform some of the bathing tasks with OT assist      Pertinent Vitals/Pain NAD    Home Living                      Prior Function            PT Goals (current goals can now be found in the care plan section) Acute Rehab PT Goals Patient Stated Goal: to go home with his wife.   PT Goal Formulation: With patient Time For Goal Achievement: 10/24/13 Potential to Achieve Goals: Good Progress towards PT goals: Progressing toward goals    Frequency  Min 4X/week    PT Plan Discharge plan needs to be updated    End of Session Equipment Utilized During Treatment: Gait belt Activity Tolerance: Patient tolerated treatment well Patient left: in chair;with call bell/phone within  reach;with chair alarm set;with family/visitor present     Time: 4008-6761 PT Time Calculation (min): 39 min  Charges:  $Gait Training: 8-22 mins $Therapeutic Activity: 8-22 mins                    G CodesDuncan Dull 15-Oct-2013, 4:15 PM Alben Deeds, Amelia DPT  [phone]

## 2013-10-13 NOTE — PMR Pre-admission (Signed)
PMR Admission Coordinator Pre-Admission Assessment  Patient: Joshua Glass is an 80 y.o., male MRN: [id number] DOB: 1933/01/25 Height: 5' 10"  (177.8 cm) Weight: 105 kg (231 lb 7.7 oz)              Insurance Information HMO:     PPO: yes     PCP:      IPA:      80/20:      OTHER:  PRIMARY: [employer] Holley Bouche      Policy#: [id number]     Subscriber: self CM Name: Sharion Dove, RN      Phone#: [phone]     Fax#: [phone] Pre-Cert#: [id number]      Employer: retired Follow up will be with Sharion Dove at [phone]. Fax updates on 10-19-13 to [place] at [phone].   Benefits:  Phone #: [phone]     Name: Eden Lathe. Date: 07-22-13     Deduct: none      Out of Pocket Max: [phone] met)      Life Max: none CIR: $265 copay for days 1-6 (auth needed)     SNF: $0 days 1-20; $140/day for days 21-100 (auth needed) Outpatient: 80%     Co-Pay: 20%, $20 copay per visit, no visit limits Home Health: 100%  (auth needed)    Co-Pay: no visit limits, based on medical necessity DME: 80%     Co-Pay: 20% Providers: in network  SECONDARY: [place] ([place])      Policy#: [id number]      Subscriber: self Effective date: 01-20-07 Benefits: Phone #: [phone] or [phone] (providers)  Emergency Contact Information Contact Information   Name Relation Home Work Page Spouse [phone]  [phone]     Current Medical History  Patient Admitting Diagnosis: Left subdural hematoma after a fall. Status post craniotomy evacuation of hematoma 10/07/2013  History of Present Illness: Joshua Glass is a 80 y.o. right-handed male with history of diabetes mellitus or peripheral neuropathy, Parkinson's disease, hypertension as well as atrial fibrillation not on anticoagulations. Patient well known to rehabilitation services from left subdural hemorrhage March 2013 after being found unresponsive with conservative care provided. He was discharged home  from rehabilitation services modified independent using a walker. Presents 10/07/2013 with recurrent episodes of aphasia and slurred speech. Reports of 2 falls in the past week. CT and imaging revealed acute left subdural hematoma. Underwent craniotomy and evacuation of hematoma 10/07/2013 per Dr. Vertell Limber. Patient remains on Sinemet for history of Parkinson's disease. Keppra for seizure prophylaxis. Following speech evaluation, he is tolerating a dysphagia level 3 diet with nectar thick liquids. Physical therapy evaluation completed 10/10/2013. M.D. has requested physical medicine rehabilitation.    Past Medical History  Past Medical History  Diagnosis Date  . 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 Type II or unspecified type diabetes mellitus without mention of complication, not stated as uncontrolled   . Allergic rhinitis, cause unspecified   . Unspecified essential hypertension   . Obstructive sleep apnea (adult) (pediatric)     NPSG 09-15-03 AHI 84.5, CPAP 18/ AHI 0  . Neuropathy   . Carpal tunnel syndrome on both sides   . Obesity   . Gallstone     no need for surgery per Dr. Deon Pilling  . A-fib   . Paralysis agitans 04/22/2013  . Abnormality of gait 04/22/2013  . Polyneuropathy in diabetes(357.2) 04/22/2013  . Skin cancer   .  Esophageal stricture     Requiring dilation  . Hypothyroidism   . H/O hiatal hernia     Family History  family history includes Coronary artery disease in his brother; Diabetes in his brother; Liver disease in his father; Other in his father; Stroke in his mother.  Prior Rehab/Hospitalizations: pt has been to CIR in the past, has had previous home health and outpt PT for balance issues. Pt had hospitalization in March 2013 and went home with home health at that time.  Current Medications  Current facility-administered medications:acetaminophen (TYLENOL) suppository 650 mg, 650 mg, Rectal, Q4H  PRN, Erline Levine, MD;  acetaminophen (TYLENOL) tablet 650 mg, 650 mg, Oral, Q4H PRN, Erline Levine, MD, 650 mg at 10/12/13 0404;  allopurinol (ZYLOPRIM) tablet 300 mg, 300 mg, Oral, Daily, Erline Levine, MD, 300 mg at 10/13/13 1033;  amLODipine (NORVASC) tablet 10 mg, 10 mg, Oral, Daily, Erline Levine, MD, 10 mg at 10/13/13 1033 aspirin EC tablet 81 mg, 81 mg, Oral, Daily, Erline Levine, MD, 81 mg at 10/13/13 1033;  B-complex with vitamin C tablet 1 tablet, 1 tablet, Oral, Daily, Erline Levine, MD, 1 tablet at 10/13/13 1034;  bisacodyl (DULCOLAX) suppository 10 mg, 10 mg, Rectal, Daily PRN, Erline Levine, MD;  carbamide peroxide (DEBROX) 6.5 % otic solution 10 drop, 10 drop, Left Ear, BID, Erline Levine, MD, 10 drop at 10/13/13 1041 carbidopa-levodopa (SINEMET IR) 25-100 MG per tablet immediate release 1.5 tablet, 1.5 tablet, Oral, TID WC, Roland Rack, MD, 1.5 tablet at 10/13/13 1032;  cholecalciferol (VITAMIN D) tablet 2,000 Units, 2,000 Units, Oral, Daily, Erline Levine, MD, 2,000 Units at 10/13/13 1033;  clotrimazole (LOTRIMIN) 1 % cream, , Topical, QHS, Erline Levine, MD dextromethorphan (DELSYM) 30 MG/5ML liquid 15 mg, 15 mg, Oral, BID, Erline Levine, MD, 15 mg at 10/13/13 1035;  docusate sodium (COLACE) capsule 100 mg, 100 mg, Oral, BID, Erline Levine, MD, 100 mg at 10/12/13 1038;  furosemide (LASIX) tablet 20 mg, 20 mg, Oral, Daily, Erline Levine, MD, 20 mg at 10/13/13 1033;  hydrALAZINE (APRESOLINE) injection 10-40 mg, 10-40 mg, Intravenous, Q4H PRN, Rigoberto Noel, MD, 20 mg at 10/10/13 2247 insulin aspart (novoLOG) injection 0-20 Units, 0-20 Units, Subcutaneous, TID WC, Rigoberto Noel, MD, 4 Units at 10/13/13 1150;  labetalol (NORMODYNE,TRANDATE) injection 10-40 mg, 10-40 mg, Intravenous, Q10 min PRN, Erline Levine, MD, 40 mg at 10/11/13 0357;  levETIRAcetam (KEPPRA) tablet 500 mg, 500 mg, Oral, BID, Rande Lawman Rumbarger, RPH, 500 mg at 10/13/13 1034 levothyroxine (SYNTHROID, LEVOTHROID) tablet 75 mcg, 75  mcg, Oral, QAC breakfast, Erline Levine, MD, 75 mcg at 10/13/13 0808;  linagliptin (TRADJENTA) tablet 5 mg, 5 mg, Oral, Daily, Erline Levine, MD, 5 mg at 10/13/13 1034;  loratadine (CLARITIN) tablet 10 mg, 10 mg, Oral, Daily PRN, Erline Levine, MD;  montelukast (SINGULAIR) tablet 10 mg, 10 mg, Oral, QHS, Erline Levine, MD, 10 mg at 10/12/13 2205 morphine 2 MG/ML injection 1-2 mg, 1-2 mg, Intravenous, Q2H PRN, Erline Levine, MD, 2 mg at 10/08/13 2015;  ondansetron [facility]) injection 4 mg, 4 mg, Intravenous, Q4H PRN, Erline Levine, MD;  ondansetron [facility]) tablet 4 mg, 4 mg, Oral, Q4H PRN, Erline Levine, MD;  pantoprazole (PROTONIX) EC tablet 40 mg, 40 mg, Oral, QHS, Erline Levine, MD, 40 mg at 10/12/13 2205 promethazine (PHENERGAN) tablet 12.5-25 mg, 12.5-25 mg, Oral, Q4H PRN, Erline Levine, MD;  senna [facility]) tablet 8.6 mg, 1 tablet, Oral, BID, Erline Levine, MD, 8.6 mg at 10/13/13 1033;  senna-docusate (Senokot-S) tablet 1 tablet, 1  tablet, Oral, QHS PRN, Erline Levine, MD;  simvastatin (ZOCOR) tablet 20 mg, 20 mg, Oral, QPM, Erline Levine, MD, 20 mg at 10/12/13 2008 tamsulosin [facility]) capsule 0.4 mg, 0.4 mg, Oral, Daily, Erline Levine, MD, 0.4 mg at 10/13/13 1033;  traMADol (ULTRAM) tablet 50 mg, 50 mg, Oral, Q6H PRN, Erline Levine, MD;  triamcinolone cream (KENALOG) 0.1 % 1 application, 1 application, Topical, Daily, Erline Levine, MD, 1 application at 78/24/23 1034  Patients Current Diet: Dysphagia level 3 with nectar thick liquids.   Precautions / Restrictions Precautions Precautions: Fall Precaution Comments: hx of multiple falls Restrictions Weight Bearing Restrictions: No   Prior Activity Level Limited Community (1-2x/wk): did get out a few times a week with wife, sometimes pushing the grocery cart (But pt's overall mobility had been more limited due to recent gout flare up per wife and had mainly been staying at home.)  [place] / [place] Devices/Equipment: Environmental consultant  (specify type) (four wheel) Home Equipment: Walker - 2 wheels;Cane - single point;Bedside commode;Grab bars - toilet;Grab bars - tub/shower;Shower seat;Hand held Hotel manager  Prior Functional Level Prior Function Level of Independence: Needs assistance Gait / Transfers Assistance Needed: pt ambulates with RW at all times; PTA pt was ambulating at supervision level  ADL's / Homemaking Assistance Needed: pt responded "sometimes" when asked whether his wife assists with bathing and dressing. Communication / Swallowing Assistance Needed: None  Current Functional Level Cognition  Overall Cognitive Status: Impaired/Different from baseline Orientation Level: Oriented X4 Following Commands: Follows one step commands with increased time Safety/Judgement: Decreased awareness of safety General Comments: patient oriented, able to respond with one word responses, does not initiate conversation or respond with sentences    Extremity Assessment (includes Sensation/Coordination)          ADLs  Overall ADL's : Needs assistance/impaired Grooming: Moderate assistance;Sitting Grooming Details (indicate cue type and reason): able to assist with deoderant Upper Body Bathing: Moderate assistance;Sitting Upper Body Bathing Details (indicate cue type and reason): cues for initiation and sequencing and maintaining attention to task Lower Body Bathing: Moderate assistance;Sit to/from stand (Pt washing legs and groin area) Toileting- Clothing Manipulation and Hygiene: Total assistance;Sit to/from stand (incontinent of BM) Functional mobility during ADLs: +2 for physical assistance;Moderate assistance (using 3 musketeer technique) General ADL Comments: working on postural control during ADL    Mobility  Overal bed mobility: Needs Assistance Bed Mobility: Supine to Sit Supine to sit: Max assist Sit to supine: +2 for physical assistance;Mod assist General bed mobility comments: HOB raised  hand held support to pullself up    Transfers  Overall transfer level: Needs assistance Equipment used: 2 person hand held assist Transfers: Sit to/from Stand Sit to Stand: +2 physical assistance;Mod assist Stand pivot transfers: +2 physical assistance;Mod assist General transfer comment: facilitating sit - stand with control at hips, knees and trunk. Motor impersistence on R    Ambulation / Gait / Stairs / Wheelchair Mobility  Ambulation/Gait Ambulation/Gait assistance: +2 physical assistance;Mod assist Ambulation Distance (Feet): 18 Feet Assistive device: 2 person hand held assist Gait Pattern/deviations: Step-through pattern;Shuffle;Decreased stride length;Trunk flexed;Decreased weight shift to right;Festinating Gait velocity: pt's speed inc and req max A, verbal and tactile cues to slow down to a safe speed.  General Gait Details: increased cues for step length as well as moderate assist to faciliate lateral weight shift.    Posture / Balance Dynamic Sitting Balance Sitting balance - Comments: flexed trunk    Special needs/care consideration BiPAP/CPAP - yes,  has his own CPAP from home but has not been using it while inpt. CPM no Continuous Drip IV no  Dialysis no         Life Vest no Oxygen no  Special Bed no  Trach Size no  Wound Vac (area) no       Skin - cranial incision, ecchymosis/rash on L torso, abd. Folds and groin                            Bowel mgmt: - last BM on 10-12-13  Bladder mgmt: - incontinence, using urinal Diabetic mgmt - pt's wife was managing this at home with meds.    Previous Home Environment Living Arrangements: Spouse/significant other Available Help at Discharge: Family;Available 24 hours/day Type of Home: House Home Layout: Two level;Able to live on main level with bedroom/bathroom Home Access: Stairs to enter Entrance Stairs-Rails: Left Entrance Stairs-Number of Steps: 2 Bathroom Shower/Tub: Chiropodist: Standard (keeps  3 in1 over toilet) Home Care Services: No  Discharge Living Setting Plans for Discharge Living Setting: Patient's home Type of Home at Discharge: House Discharge Home Layout: Two level;Able to live on main level with bedroom/bathroom Alternate Level Stairs-Rails:  (does not go upstairs) Discharge Home Access: Stairs to enter Entrance Stairs-Rails: Left Entrance Stairs-Number of Steps: 2 Does the patient have any problems obtaining your medications?: No  Social/Family/Support Systems Patient Roles: Spouse Contact Information: wife Izora Gala  Anticipated Caregiver: wife  Anticipated Ambulance person Information: see above Ability/Limitations of Caregiver: able to provide some physical assistance as needed  Caregiver Availability: 24/7 Discharge Plan Discussed with Primary Caregiver: Yes (Pt has been to CIR before. Pt/wife familiar w/ DC plan. ) Is Caregiver In Agreement with Plan?: Yes Does Caregiver/Family have Issues with Lodging/Transportation while Pt is in Rehab?: No  Goals/Additional Needs Patient/Family Goal for Rehab: supervision to minA w/ PT and SLP, Supervision to [employer] w/ OT Expected length of stay: 14-20 days Cultural Considerations: none Dietary Needs: dys 3, nectar thick Equipment Needs: to be determined Pt/Family Agrees to Admission and willing to participate: Yes (met with pt and wife on 3-24 and 3-25. Extended family prese) Program Orientation Provided & Reviewed with Pt/Caregiver Including Roles  & Responsibilities: Yes   Decrease burden of Care through IP rehab admission: NA  Possible need for SNF placement upon discharge: not anticipated   Patient Condition: This patient's medical and functional status has changed since the consult dated: 10-11-13 in which the Rehabilitation Physician determined and documented that the patient's condition is appropriate for intensive rehabilitative care in an inpatient rehabilitation facility. See "History of Present Illness"  (above) for medical update. Functional changes are: moderate assist of 2 for transfers, ambulating 32' with assist of 2. Patient's medical and functional status update has been discussed with the Rehabilitation physician and patient remains appropriate for inpatient rehabilitation. Will admit to inpatient rehab today.  Preadmission Screen Completed By:  Nanetta Batty, PT 10/13/2013 1:36 PM ______________________________________________________________________   Discussed status with Dr. Naaman Plummer on 10-13-13 at 1412 and received telephone approval for admission today.  Admission Coordinator:  Nanetta Batty, PT time 1412/Date3-25-15

## 2013-10-13 NOTE — Progress Notes (Signed)
Occupational Therapy Treatment Patient Details Name: MANINDER DEBOER MRN: [id number] DOB: 1932/09/04 Today's Date: 10/13/2013    History of present illness This is a 80 year old male s/p left crani for traumatic left SDH on 3/19. PMH -OSA on cpap, diabetes mellitus with diabetic peripheral neuropathy, atrial fibrillation not on anticoagulation, Parkinson's disease, and hypertension, baseline ambulates with walker, h/o  falls    OT comments  Pt with improved participation today. [place] with PT to facilitate proper weight shifts during functional ADL tasks and mobility. Able to facilitate upright posture and appropriate weight shifts to increase safety and independence with ADL and functional ambulation. Pt with premorbid limitations due to Parkinson's; however, wife states attention and initiation is much worse. Discussed possibility of using ADHD intervention to assess affect on attention. Will continue to follow to facilitate D/C to CIR.   Follow Up Recommendations  CIR;Supervision/Assistance - 24 hour    Equipment Recommendations  None recommended by OT    Recommendations for Other Services Rehab consult    Precautions / Restrictions Precautions Precautions: Fall Precaution Comments: hx of multiple falls       Mobility Bed Mobility Overal bed mobility: Needs Assistance Bed Mobility: Supine to Sit     Supine to sit: Max assist        Transfers Overall transfer level: Needs assistance   Transfers: Sit to/from Stand Sit to Stand: +2 physical assistance;Mod assist         General transfer comment: facilitating sit - stand with control at hips, knees and trunk. Motor impersistence on R    Balance Overall balance assessment: Needs assistance Sitting-balance support: Single extremity supported;Feet supported Sitting balance-Leahy Scale: Fair Sitting balance - Comments: flexed trunk Postural control: Left lateral lean (responds to tactile cues to improve  posture) Standing balance support: Bilateral upper extremity supported;During functional activity Standing balance-Leahy Scale: Poor Standing balance comment: forward head. ant flexed posture                   ADL   Grooming: Moderate assistance;Sitting Upper Body Bathing Details (indicate cue type and reason): cues for initiation and sequencing and maintaining attention to task   Lower Body Bathing: Moderate assistance;Sit to/from stand (Pt washing legs and groin area)     Toileting- Clothing Manipulation and Hygiene: Total assistance;Sit to/from stand (incontinent of BM)   Functional mobility during ADLs: +2 for physical assistance;Moderate assistance (using 3 musketeer technique) General ADL Comments: working on postural control during ADL      Vision                     Perception     Praxis      Cognition   Behavior During Therapy: Flat affect Overall Cognitive Status: Impaired/Different from baseline Area of Impairment: Following commands;Safety/judgement;Problem solving     Memory: Decreased recall of precautions;Decreased short-term memory  Following Commands: Follows one step commands with increased time Safety/Judgement: Decreased awareness of safety   Problem Solving: Slow processing;Decreased initiation;Difficulty sequencing;Requires verbal cues;Requires tactile cues General Comments: patient oriented, able to respond with one word responses, does not initiate conversation or respond with sentences    Extremity/Trunk Assessment               Exercises       General Comments      Pertinent Vitals/ Pain       no apparent distress O2 97 RA  Home Living  Prior Functioning/Environment              Frequency Min 2X/week     Progress Toward Goals  OT Goals(current goals can now be found in the care plan section)  Progress towards OT goals: Progressing toward  goals  Acute Rehab OT Goals Patient Stated Goal: to go home with his wife.   OT Goal Formulation: With patient Time For Goal Achievement: 10/25/13 Potential to Achieve Goals: Good ADL Goals Pt Will Perform Grooming: with set-up;sitting Pt Will Perform Upper Body Bathing: with min assist;sitting Pt Will Perform Lower Body Bathing: with min assist;sit to/from stand Pt Will Perform Upper Body Dressing: sitting;with min assist Pt Will Perform Lower Body Dressing: with min assist;sit to/from stand Pt Will Transfer to Toilet: with min assist;ambulating;bedside commode Pt Will Perform Toileting - Clothing Manipulation and hygiene: with min assist;sit to/from stand Pt/caregiver will Perform Home Exercise Program: Increased strength;Right Upper extremity;With Supervision  Plan Discharge plan remains appropriate    End of Session Equipment Utilized During Treatment: Gait belt  Activity Tolerance Patient tolerated treatment well   Patient Left in chair;with call bell/phone within reach;with family/visitor present   Nurse Communication Mobility status        Time: 9833-8250 OT Time Calculation (min): 39 min  Charges: OT General Charges $OT Visit: 1 Procedure OT Treatments $Self Care/Home Management : 8-22 mins  Caresse Sedivy,HILLARY 10/13/2013, 12:32 PM  [facility], OTR/L  [phone] 10/13/2013

## 2013-10-13 NOTE — Progress Notes (Signed)
Rehab admissions - We did receive insurance approval yesterday for inpatient rehab and bed is available today. I received medical clearance from Verdis Prime, neurosurgery RN with Dr. Vertell Limber.  Paperwork completed and pt will be admitted later today to inpt rehab. Updated Hassan Rowan case Freight forwarder and Raquel Sarna, Education officer, museum of plan for [employer] RN also aware of plan.  Please call me with any questions. Thanks.  Nanetta Batty, PT Rehabilitation Admissions Coordinator [phone]
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
## 2013-10-13 NOTE — Progress Notes (Signed)
Placed patient on CPAP with Auto titrate of min prssure of 5 and max pressure of 20.  Patient is using home nasal mask. Patient is tolerating just fine .  RT will continue to monitor.
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
## 2013-10-14 ENCOUNTER — Inpatient Hospital Stay (HOSPITAL_COMMUNITY): Payer: Medicare Other | Admitting: Speech Pathology

## 2013-10-14 ENCOUNTER — Inpatient Hospital Stay (HOSPITAL_COMMUNITY): Payer: Medicare Other | Admitting: Physical Therapy

## 2013-10-14 ENCOUNTER — Inpatient Hospital Stay (HOSPITAL_COMMUNITY): Payer: Medicare Other

## 2013-10-14 ENCOUNTER — Inpatient Hospital Stay (HOSPITAL_COMMUNITY): Payer: Medicare Other | Admitting: Occupational Therapy

## 2013-10-14 DIAGNOSIS — S065X9A Traumatic subdural hemorrhage with loss of consciousness of unspecified duration, initial encounter: Secondary | ICD-10-CM

## 2013-10-14 DIAGNOSIS — I10 Essential (primary) hypertension: Secondary | ICD-10-CM

## 2013-10-14 DIAGNOSIS — E1149 Type 2 diabetes mellitus with other diabetic neurological complication: Secondary | ICD-10-CM

## 2013-10-14 DIAGNOSIS — S065XAA Traumatic subdural hemorrhage with loss of consciousness status unknown, initial encounter: Secondary | ICD-10-CM

## 2013-10-14 DIAGNOSIS — E1142 Type 2 diabetes mellitus with diabetic polyneuropathy: Secondary | ICD-10-CM

## 2013-10-14 DIAGNOSIS — G20 Parkinson's disease: Secondary | ICD-10-CM

## 2013-10-14 DIAGNOSIS — G40919 Epilepsy, unspecified, intractable, without status epilepticus: Secondary | ICD-10-CM

## 2013-10-14 DIAGNOSIS — R1310 Dysphagia, unspecified: Secondary | ICD-10-CM

## 2013-10-14 LAB — CBC WITH DIFFERENTIAL/PLATELET
Basophils Absolute: 0 10*3/uL (ref 0.0–0.1)
Basophils Relative: 0 % (ref 0–1)
EOS ABS: 0.4 10*3/uL (ref 0.0–0.7)
Eosinophils Relative: 3 % (ref 0–5)
HCT: 36.7 % — ABNORMAL LOW (ref 39.0–52.0)
HEMOGLOBIN: 12.4 g/dL — AB (ref 13.0–17.0)
LYMPHS PCT: 23 % (ref 12–46)
Lymphs Abs: 2.7 10*3/uL (ref 0.7–4.0)
MCH: 28.6 pg (ref 26.0–34.0)
MCHC: 33.8 g/dL (ref 30.0–36.0)
MCV: 84.8 fL (ref 78.0–100.0)
MONOS PCT: 11 % (ref 3–12)
Monocytes Absolute: 1.2 10*3/uL — ABNORMAL HIGH (ref 0.1–1.0)
Neutro Abs: 7.4 10*3/uL (ref 1.7–7.7)
Neutrophils Relative %: 63 % (ref 43–77)
PLATELETS: 247 10*3/uL (ref 150–400)
RBC: 4.33 MIL/uL (ref 4.22–5.81)
RDW: 15.8 % — ABNORMAL HIGH (ref 11.5–15.5)
WBC: 11.8 10*3/uL — AB (ref 4.0–10.5)

## 2013-10-14 LAB — COMPREHENSIVE METABOLIC PANEL
ALK PHOS: 96 U/L (ref 39–117)
ALT: 40 U/L (ref 0–53)
AST: 36 U/L (ref 0–37)
Albumin: 2.7 g/dL — ABNORMAL LOW (ref 3.5–5.2)
BILIRUBIN TOTAL: 1 mg/dL (ref 0.3–1.2)
BUN: 14 mg/dL (ref 6–23)
CO2: 28 meq/L (ref 19–32)
Calcium: 8.7 mg/dL (ref 8.4–10.5)
Chloride: 102 mEq/L (ref 96–112)
Creatinine, Ser: 1.01 mg/dL (ref 0.50–1.35)
GFR calc Af Amer: 80 mL/min — ABNORMAL LOW (ref >90)
GFR, EST NON AFRICAN AMERICAN: 69 mL/min — AB (ref >90)
GLUCOSE: 150 mg/dL — AB (ref 70–99)
POTASSIUM: 3 meq/L — AB (ref 3.7–5.3)
SODIUM: 143 meq/L (ref 137–147)
TOTAL PROTEIN: 6.5 g/dL (ref 6.0–8.3)

## 2013-10-14 LAB — GLUCOSE, CAPILLARY
Glucose-Capillary: 155 mg/dL — ABNORMAL HIGH (ref 70–99)
Glucose-Capillary: 216 mg/dL — ABNORMAL HIGH (ref 70–99)
Glucose-Capillary: 154 mg/dL — ABNORMAL HIGH (ref 70–99)
Glucose-Capillary: 152 mg/dL — ABNORMAL HIGH (ref 70–99)

## 2013-10-14 MED ORDER — POTASSIUM CHLORIDE CRYS ER 20 MEQ PO TBCR
20.0000 meq | EXTENDED_RELEASE_TABLET | Freq: Every day | ORAL | Status: DC
  Administered 2013-10-14 – 2013-10-18 (×5): 20 meq via ORAL
  Filled 2013-10-14 (×6): qty 1

## 2013-10-14 NOTE — Progress Notes (Signed)
Inpatient Diabetes Program Recommendations  AACE/ADA: New Consensus Statement on Inpatient Glycemic Control (2013)  Target Ranges:  Prepandial:   less than 140 mg/dL      Peak postprandial:   less than 180 mg/dL (1-2 hours)      Critically ill patients:  140 - 180 mg/dL   Reason for Assessment: Hyperglycemia Results for RENZO, VINCELETTE (MRN [id number]) as of 10/14/2013 11:46  Ref. Range 10/13/2013 00:42 10/13/2013 06:39 10/13/2013 12:06 10/13/2013 17:32 10/14/2013 07:35  Glucose-Capillary Latest Range: 70-99 mg/dL 124 (H) 118 (H) 164 (H) 131 (H) 155 (H)     Diabetes history: DM2 Outpatient Diabetes medications: Januvia 50 mg/day Current orders for Inpatient glycemic control: Tradjenta 5 mg QD and Novolog resistant  Inpatient Diabetes Program Recommendations HgbA1C: Check HgbA1C to assess glycemic control prior to hospitalization Diet: Add CHO mod med  Note: Will continue to follow. Thank you. Lorenda Peck, RD, LDN, CDE Inpatient Diabetes Coordinator [phone]
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
## 2013-10-14 NOTE — Progress Notes (Signed)
Patient information reviewed and entered into eRehab system by Tarra Pence, RN, CRRN, PPS Coordinator.  Information including medical coding and functional independence measure will be reviewed and updated through discharge.     Per nursing patient was given "Data Collection Information Summary for Patients in Inpatient Rehabilitation Facilities with attached "Privacy Act Statement-Health Care Records" upon admission.  

## 2013-10-14 NOTE — Evaluation (Signed)
The skilled treatment note has been reviewed and SLP is in agreement.  Tynisha Ogan, M.A., CCC-SLP  [phone]   
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
## 2013-10-14 NOTE — Progress Notes (Signed)
Physical Therapy Session Note  Patient Details  Name: Joshua Glass MRN: [id number] Date of Birth: April 03, 1933  Today's Date: 10/14/2013 Time: 2536-6440 Time Calculation (min): 53 min   Skilled Therapeutic Interventions/Progress Updates:   Pt sitting in w/c with wife/visitors present. Pt transported to gym in w/c and negotiated up/down 5 steps with 2 rails and total A +2 with step up L LE leading/step down R LE leading pattern. Pt with improved response to automatic tasks and tactile cuing vs verbal cuing. From w/c pt performed sit<>stand x 5 with mod-max A and verbal cues for hand placement/safety with uncontrolled descent, difficulty with anterior weightshift (likely baseline secondary to hx of PD), and impaired initiation. RN present to administer pain medication for headache in gym. Pt ambulated 25' using RW and mod A with manual facilitation of lateral weightshift for improved L foot clearance, verbal/tactile cues for initiation and sequencing, and verbal cues/assist for safe use of RW with pt preferring to hold R rail in hallway/L UE on RW. Returned to room and pt performed stand pivot transfer w/c>bed with mod A and sit > supine max A, with wife/visitors present.   Therapy Documentation Precautions:  Precautions Precautions: Fall Precaution Comments: hx of multiple falls due to Parkinsons, HOH Restrictions Weight Bearing Restrictions: No Vital Signs: Therapy Vitals Pulse Rate: 80 Patient Position, if appropriate: Sitting Oxygen Therapy SpO2: 96 % O2 Device: None (Room air) Pain: Pain Assessment Pain Assessment: 0-10 Pain Score:  ("some") Pain Type: Surgical pain;Acute pain Pain Location: Head Pain Orientation: Left Pain Descriptors / Indicators: Aching Pain Intervention(s): Medication (See eMAR);RN made aware  See FIM for current functional status  Therapy/Group: Individual Therapy  Laretta Alstrom 10/14/2013, 4:14 PM
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
## 2013-10-14 NOTE — IPOC Note (Signed)
Overall Plan of Care [facility]) Patient Details Name: Joshua Glass MRN: [id number] DOB: 1933/04/04  Admitting Diagnosis: L SDH crani evacuation s/p fall  Hospital Problems: Active Problems:   SDH (subdural hematoma)     Functional Problem List: Nursing Bladder;Bowel;Edema;Medication Management;Pain;Safety;Sensory;Skin Integrity  PT Balance;Motor;Endurance;Pain;Safety;Sensory;Skin Integrity;Other (comment) (Strength)  OT Balance;Cognition;Endurance;Motor;Safety;Perception;Skin Integrity  SLP Cognition;Motor;Nutrition  TR         Basic ADL's: OT Grooming;Bathing;Dressing;Toileting     Advanced  ADL's: OT       Transfers: PT Bed Mobility;Bed to Chair;Car;Furniture  OT Toilet;Tub/Shower     Locomotion: PT Stairs;Ambulation;Wheelchair Mobility     Additional Impairments: OT    SLP Swallowing;Social Cognition;Communication expression Social Interaction;Problem Solving;Memory;Attention;Awareness  TR      Anticipated Outcomes Item Anticipated Outcome  Self Feeding setup  Swallowing  supervision   Basic self-care  min (A)  Toileting  min (A)   Bathroom Transfers Min (A)  Bowel/Bladder  Cont of bowel and bladder  Transfers  Min A  Locomotion  Min A  Communication  Min-supervision  Cognition  Min  Pain  less than 2  Safety/Judgment  adhere to safety protocol   Therapy Plan: PT Intensity: Minimum of 1-2 x/day ,45 to 90 minutes PT Frequency: 5 out of 7 days PT Duration Estimated Length of Stay: 10-14days OT Intensity: Minimum of 1-2 x/day, 45 to 90 minutes OT Frequency: 5 out of 7 days OT Duration/Estimated Length of Stay: 12-14 SLP Intensity: Minumum of 1-2 x/day, 30 to 90 minutes SLP Frequency: 5 out of 7 days SLP Duration/Estimated Length of Stay: 15-18 days       Team Interventions: Nursing Interventions Patient/Family Education;Bladder Management;Bowel Management;Disease Management/Prevention;Pain Management;Medication Management;Skin Care/Wound  Management;Discharge Planning  PT interventions    OT Interventions Balance/vestibular training;Cognitive remediation/compensation;Community reintegration;Discharge planning;DME/adaptive equipment instruction;Functional mobility training;Neuromuscular re-education;Self Care/advanced ADL retraining;Skin care/wound managment;Patient/family education;Therapeutic Activities;Therapeutic Exercise  SLP Interventions Cognitive remediation/compensation;Environmental controls;Internal/external aids;Oral motor exercises;Therapeutic Exercise;Cueing hierarchy;Dysphagia/aspiration precaution training;Functional tasks;Patient/family education;Therapeutic Activities  TR Interventions    SW/CM Interventions      Team Discharge Planning: Destination: PT-Home ,OT- Home , SLP-Home Projected Follow-up: PT-Home health PT, OT-  Home health OT, SLP-Home Health SLP;Outpatient SLP;24 hour supervision/assistance Projected Equipment Needs: PT-To be determined, OT- To be determined, SLP-None recommended by SLP Equipment Details: PT- , OT-  Patient/family involved in discharge planning: PT- Patient,  OT-Family member/caregiver;Patient, SLP-Family member/caregiver  MD ELOS: 13-14 days Medical Rehab Prognosis:  Good Assessment: The patient has been admitted for CIR therapies. The team will be addressing, functional mobility, strength, stamina, balance, safety, adaptive techniques/equipment, self-care, bowel and bladder mgt, patient and caregiver education, NMR, gait stability/technique, cognition, communication, ego-support, pain mgt. Goals have been set at min assist for PT, OT, SLP.  Parkinson's disease is a major factor in ongoing deficits.Meredith Staggers, MD, FAAPMR      See Team Conference Notes for weekly updates to the plan of care

## 2013-10-14 NOTE — Progress Notes (Signed)
[facility] PHYSICAL MEDICINE & REHABILITATION     PROGRESS NOTE    Subjective/Complaints: No problems reported. Denies pain. Seemed to sleep well. Wearing nasal cpap  Objective: Vital Signs: Blood pressure 164/81, pulse 74, temperature 98 F (36.7 C), temperature source Oral, resp. rate 18, height 5\' 10"  (1.778 m), weight 98.567 kg (217 lb 4.8 oz), SpO2 91.00%. No results found.  Recent Labs  10/14/13 0546  WBC 11.8*  HGB 12.4*  HCT 36.7*  PLT 247    Recent Labs  10/14/13 0546  NA 143  K 3.0*  CL 102  GLUCOSE 150*  BUN 14  CREATININE 1.01  CALCIUM 8.7   CBG (last 3)   Recent Labs  10/13/13 1206 10/13/13 1732 10/14/13 0735  GLUCAP 164* 131* 155*    Wt Readings from Last 3 Encounters:  10/13/13 98.567 kg (217 lb 4.8 oz)  10/09/13 105 kg (231 lb 7.7 oz)  10/09/13 105 kg (231 lb 7.7 oz)    Physical Exam:  Eyes: EOM are normal.  Neck: Normal range of motion. Neck supple. No thyromegaly present.  Cardiovascular:  Cardiac rate controlled. Reg rhythm  Respiratory: Effort normal and breath sounds normal. No respiratory distress.  GI: Soft. Bowel sounds are normal. He exhibits no distension. Non-tender Neurological:  Slow to arouse/process Flat facies. He was able to provide his age place as well as date of birth. He did follow simple commands. His speech was a bit dysarthric but intelligible. Slow to process. Rigidity in all 4 limbs. RUE is 3- to 3/5 deltoid, bicep, tricep, HI. LUE is 1+ deltoid/shoulder (RTC), 3- bicep, tricep, 3 hand.Marland Kitchen RLE is 2/5 prox HF, KE, to 1-2 distally at ankle with ADF/APF---inconsistent (but better) initiating movement. LLE 3- HF, 3 KE, ankle. Senses pain throughout on right.  Skin:  Craniotomy sites with staples intact - no drainage Psych: more alert today. Affect more dynamic. occasionallly jokes and kids around   Assessment/Plan: 1. Functional deficits secondary to left SDH after fall which require 3+ hours per day of  interdisciplinary therapy in a comprehensive inpatient rehab setting. Physiatrist is providing close team supervision and 24 hour management of active medical problems listed below. Physiatrist and rehab team continue to assess barriers to discharge/monitor patient progress toward functional and medical goals. FIM:                      Expression Expression Mode: Verbal Expression: 3-Expresses basic 50 - 74% of the time/requires cueing 25 - 50% of the time. Needs to repeat parts of sentences.  Social Interaction Social Interaction: 3-Interacts appropriately 50 - 74% of the time - May be physically or verbally inappropriate.        Medical Problem List and Plan:  1. Left subdural hematoma after a fall. Status post craniotomy evacuation of hematoma 10/07/2013  2. DVT Prophylaxis/Anticoagulation: SCDs. Monitor for any signs of DVT  3. Pain Management: Ultram as needed. Monitor with increased mobility  4. Neuropsych: This patient is not capable of making decisions on his own behalf  5.Dysphagia.Dysphgia 3 nectar liquids.Follow up speech therapy.  6. Seizure prophylaxis. Keppra 500 mg twice a day.  7. Parkinson's disease. Continue Sinemet as directed.  .8 Diabetes mellitus with peripheral neuropathy. Latest hemoglobin A1c 8.3. Check blood sugars a.c. and at bedtime. Continue tradjenta 5 mg daily.  9. Hypertension. Norvasc 10 mg daily, Lasix 20 mg daily. Monitor with increased mobility  10. Hyperlipidemia. Zocor  11. History of atrial fibrillation. No anticoagulants prior  to admission. Cardiac rate controlled   -replace potassium 12. Asthma. Singulair 10 mg each bedtime. No shortness of breath reported  13. BPH. Flomax 0.4 mg daily. Check PVRs x3  14. Hypothyroidism. Synthroid  15. Obstructive sleep apnea. Nasal cpap 16. Leukocytosis---recheck next week--no signs of infection at present--trending down LOS (Days) 1 A FACE TO FACE EVALUATION WAS PERFORMED  SWARTZ,ZACHARY  T 10/14/2013 8:28 AM
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
## 2013-10-15 ENCOUNTER — Inpatient Hospital Stay (HOSPITAL_COMMUNITY): Payer: Medicare Other | Admitting: Rehabilitation

## 2013-10-15 ENCOUNTER — Inpatient Hospital Stay (HOSPITAL_COMMUNITY): Payer: Medicare Other | Admitting: *Deleted

## 2013-10-15 ENCOUNTER — Inpatient Hospital Stay (HOSPITAL_COMMUNITY): Payer: Medicare Other | Admitting: Occupational Therapy

## 2013-10-15 ENCOUNTER — Inpatient Hospital Stay (HOSPITAL_COMMUNITY): Payer: Medicare Other | Admitting: Speech Pathology

## 2013-10-15 DIAGNOSIS — I10 Essential (primary) hypertension: Secondary | ICD-10-CM

## 2013-10-15 DIAGNOSIS — G20 Parkinson's disease: Secondary | ICD-10-CM

## 2013-10-15 DIAGNOSIS — G40909 Epilepsy, unspecified, not intractable, without status epilepticus: Secondary | ICD-10-CM

## 2013-10-15 DIAGNOSIS — R1310 Dysphagia, unspecified: Secondary | ICD-10-CM

## 2013-10-15 DIAGNOSIS — S065XAA Traumatic subdural hemorrhage with loss of consciousness status unknown, initial encounter: Secondary | ICD-10-CM

## 2013-10-15 DIAGNOSIS — E1149 Type 2 diabetes mellitus with other diabetic neurological complication: Secondary | ICD-10-CM

## 2013-10-15 DIAGNOSIS — S065X9A Traumatic subdural hemorrhage with loss of consciousness of unspecified duration, initial encounter: Secondary | ICD-10-CM

## 2013-10-15 DIAGNOSIS — E1142 Type 2 diabetes mellitus with diabetic polyneuropathy: Secondary | ICD-10-CM

## 2013-10-15 LAB — GLUCOSE, CAPILLARY
GLUCOSE-CAPILLARY: 137 mg/dL — AB (ref 70–99)
Glucose-Capillary: 182 mg/dL — ABNORMAL HIGH (ref 70–99)
Glucose-Capillary: 113 mg/dL — ABNORMAL HIGH (ref 70–99)
Glucose-Capillary: 169 mg/dL — ABNORMAL HIGH (ref 70–99)
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
## 2013-10-15 NOTE — Progress Notes (Signed)
Occupational Therapy Session Note  Patient Details  Name: Joshua Glass MRN: [id number] Date of Birth: 07-Aug-1932  Today's Date: 10/15/2013 Time: 9233-0076 Time Calculation (min): 15 min   Skilled Therapeutic Interventions/Progress Updates:    Pt participated in self-feeding group with focus on attention to task, portion management, swallowing strategies, and safety awareness. Pt required max cues for portion management with drinks. Pt required min verbal cues for attention to task. Pt verbalized his dislike for "carrots". Pt with slow reaction speed to bring utensil to mouth. Wife present during meal   Therapy Documentation Precautions:  Precautions Precautions: Fall Precaution Comments: hx of multiple falls due to Parkinsons, HOH Restrictions Weight Bearing Restrictions: No    Pain: Pain Assessment Pain Assessment: No/denies pain ADL: ADL ADL Comments: see FIM Exercises:   Other Treatments:    See FIM for current functional status  Therapy/Group: Group Therapy  Peri Maris 10/15/2013, 2:00 PM Pager: [phone]
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
## 2013-10-15 NOTE — Progress Notes (Signed)
Occupational Therapy Note  Patient Details  Name: Joshua Glass MRN: [id number] Date of Birth: 1932/08/14  Today's Date: 10/15/2013 Time:  0800-0915  75 min  1st session Pain:  none Individual session    1st session:  Lying in bed with HOB up 45 degrees.  NT feeding pt.  Facilitated pt feeding self and drinking from cup rest of meal. Pt. Completed juice but did not eat any more food.  Addressed bed mobility, supine to sit, sitting balance, transfers and intellectual awareness.  Pt. Rolled to right or left with max assist.  He washed anterior peri area with set up.  Pt. Went from supine to sit with max assist.  Sat EOB with SBA and Upper trunk in left lateral flexion.  Assisted Pt to EOB.  He donned shirt with mod assist plus increased time.  Did SPT to wc going to left with total assist +2,  pt= 50 %.  .   Pt. answereed one question to news item with accuracy.  Left pt in wc with Speech therapist.    Time:  1000-1100   (60  Min)  2nd session Pain:  none Individual session  2nd session:   Addressed pt engaging in therapeutic activities using BUE, attention to task,  Increased initiation.  Pt. Propelled wc to sink with max assist.  Engaged in grooming activities with pt utilizing LUE to reach for cup and drink.  Pt applied shaving cream to prepare for shaving.   Unable to partake of shaving self.   He assisted with combing hair and washing face.  Left pt in wc with wife in room and safety belt on.      Time:  1300-1330   (30 min)  3rd session Pain:  none Individual session  3rd session: Addressed sit to stand, standing balance, and grooming.  Pt.sitting in wc.  Propelled wc to sink with max assist.  Pt followed commands to lock chair with cues to lock left brakes.  Pt. Able to do own set up for brushing teeth.  He stood x 2 with max assist sit to stand and minimal asist with static sitting balance.  Pt. Stood for 1-2 minutes.  He was incontinent of urine.  Had him stand again to change briefs.   Wife assisted with briefs and pants.  Left pt in wc with safety belt on and wife present.      Lisa Roca 10/15/2013, 8:23 AM

## 2013-10-15 NOTE — Progress Notes (Signed)
Physical Therapy Session Note  Patient Details  Name: Joshua Glass MRN: [id number] Date of Birth: 11/15/1932  Today's Date: 10/15/2013 Time: 8938-1017 Time Calculation (min): 50 min  Short Term Goals: Week 1:  PT Short Term Goal 1 (Week 1): Pt to perform bed mobility w/ min A, 50% of time PT Short Term Goal 2 (Week 1): Pt to perform transfers w/ min A, 50% of time PT Short Term Goal 3 (Week 1): Pt to amb 100' w/ min A, 50% of time PT Short Term Goal 4 (Week 1): Pt to sustain sitting balance on various surfaces w/ close(S) PT Short Term Goal 5 (Week 1): Pt to sustain standing balance during functional tasks w/ min A, 50% of time  Skilled Therapeutic Interventions/Progress Updates:   Pt sitting in w/c and quick release belt on, with wife in room. Pt transported room > gym and attempted gait training x 25' using RW and modA for facilitation of R lateral weight shift for improved L foot clearance and assist for RW mgmt as pt demo anterior/L lean, decreased stance time on R in midstance, and shuffling L > R LE with fatigue. Gait training 110' x 2 performed with pt using HHA x 2 and pt requiring cues for increased L step length and B foot clearance. Pt with improved gait speed/fluidity of gait facilitated by PTs using HHA. Pt requires max A for sit<>stand from w/c and EOB with verbal/tactiles cues for initiation, anterior weightshift, and hand placement. Seated EOB in ADL apt, pt required verbal cues for upright head/trunk posture with PT performing passive B pectoral stretch. Pt ambulated with HHA x 2 ADL apt > gym and engaged in game of "tic tac toe" on board in front of pt to facilitate thoracic and lumbar lateral flexion and extension, pelvic tilt to neutral, and anterior weight shift, reaching forward/to R with mirror to provide visual feedback (Pt unable to successfully perform with L UE 2/2 hx of rotator cuff injury). Pt provided max manual cuing with limited trunk mobility noted. Pt performed  stand pivot transfer w/c>bed and sit>supine with max A, bed alarm on with wife and visitors in room.   Therapy Documentation Precautions:  Precautions Precautions: Fall Precaution Comments: hx of multiple falls due to Parkinsons, HOH Restrictions Weight Bearing Restrictions: No Pain: Pain Assessment Pain Assessment: No/denies pain  See FIM for current functional status  Therapy/Group: Individual Therapy  Laretta Alstrom 10/15/2013, 4:11 PM

## 2013-10-15 NOTE — Progress Notes (Signed)
Speech Language Pathology Daily Session Notes  Patient Details  Name: Joshua Glass MRN: [id number] Date of Birth: [birth year]  Today's Date: 10/15/2013  Session 1 Time: 8003-4917 Time Calculation (min): 25 min  Session 2 Time: 1200-1230 Time Calculation: 30 min  Short Term Goals: Week 1: SLP Short Term Goal 1 (Week 1): Patient will utilize external aids to assist in recall of new and daily information with Mod multimodal cues. SLP Short Term Goal 2 (Week 1): Patient will attend to functional task for 5 minutes with Mod verbal cues. SLP Short Term Goal 3 (Week 1): Patient will complete basic problem solving activities with Mod multimodal cues.  SLP Short Term Goal 4 (Week 1): Patient will consume current diet without overt s/s of aspiration with Mod verbal cues to utilize safe swallow strategies. SLP Short Term Goal 5 (Week 1): Patient will initiate verbal expression with Mod multimodal cues.  SLP Short Term Goal 6 (Week 1): Patient will utilize speech intelligibility strategies with Mod multimodal cues at the word level.  Skilled Therapeutic Interventions:  Session 1: Skilled treatment session focused on cognitive-linguistic goals. Pt completed basic reading comprehension tasks with Mod I but required Max A multimodal cueing to sustain attention to task and utilize increased vocal intensity while reading aloud. Pt also required Max A to self-monitor and correct errors during a basic written expression task. Pt lethargic throughout the session with intermittent eye closing. Continue with current plan of care.    Session 2: Pt participated in co-treatment with OT with focus on self-feeding, dysphagia and cognitive-linguistic goals. SLP facilitated session by providing Max multimodal cueing for utilization of small bites/sips and to clear oral cavity of food before drinking liquids. Pt with an intermittent wet vocal quality that cleared with a cued throat clear and an explosive coughing episode  X 1 due to large sip of liquid. Pt demonstrated increased vocal intensity, verbal expression and social interaction throughout the session.   FIM:  Comprehension Comprehension Mode: Auditory Comprehension: 4-Understands basic 75 - 89% of the time/requires cueing 10 - 24% of the time Expression Expression Mode: Verbal Expression: 2-Expresses basic 25 - 49% of the time/requires cueing 50 - 75% of the time. Uses single words/gestures. Social Interaction Social Interaction: 2-Interacts appropriately 25 - 49% of time - Needs frequent redirection. Problem Solving Problem Solving: 2-Solves basic 25 - 49% of the time - needs direction more than half the time to initiate, plan or complete simple activities Memory Memory: 3-Recognizes or recalls 50 - 74% of the time/requires cueing 25 - 49% of the time FIM - Eating Eating Activity: 5: Supervision/cues  Pain Pain Assessment Pain Assessment: No/denies pain  Therapy/Group: Individual Therapy and Group Therapy  Fronia Depass 10/15/2013, 2:55 PM

## 2013-10-16 ENCOUNTER — Inpatient Hospital Stay (HOSPITAL_COMMUNITY): Payer: Medicare Other | Admitting: *Deleted

## 2013-10-16 ENCOUNTER — Inpatient Hospital Stay (HOSPITAL_COMMUNITY): Payer: Medicare Other | Admitting: Speech Pathology

## 2013-10-16 ENCOUNTER — Inpatient Hospital Stay (HOSPITAL_COMMUNITY): Payer: Medicare Other | Admitting: Occupational Therapy

## 2013-10-16 DIAGNOSIS — R269 Unspecified abnormalities of gait and mobility: Secondary | ICD-10-CM

## 2013-10-16 DIAGNOSIS — E108 Type 1 diabetes mellitus with unspecified complications: Secondary | ICD-10-CM

## 2013-10-16 DIAGNOSIS — IMO0002 Reserved for concepts with insufficient information to code with codable children: Secondary | ICD-10-CM

## 2013-10-16 DIAGNOSIS — I6200 Nontraumatic subdural hemorrhage, unspecified: Secondary | ICD-10-CM

## 2013-10-16 DIAGNOSIS — E1065 Type 1 diabetes mellitus with hyperglycemia: Secondary | ICD-10-CM

## 2013-10-16 LAB — GLUCOSE, CAPILLARY
GLUCOSE-CAPILLARY: 143 mg/dL — AB (ref 70–99)
GLUCOSE-CAPILLARY: 178 mg/dL — AB (ref 70–99)
Glucose-Capillary: 203 mg/dL — ABNORMAL HIGH (ref 70–99)
Glucose-Capillary: 120 mg/dL — ABNORMAL HIGH (ref 70–99)

## 2013-10-16 NOTE — Progress Notes (Signed)
Physical Therapy Session Note  Patient Details  Name: Joshua Glass MRN: [id number] Date of Birth: [birth year]  Today's Date: 10/16/2013 Time: 0856-0945,1415-1510     Skilled Therapeutic Interventions/Progress Updates:  Session I 49 min Patient in bed, rolling and supine to sit with max A ,transfer to w/c with max A.  NuStep with mod verbal and tactile cues and decreased participation 2 x 4 min on level 4 ,to facilitate motor control ,reciprocal motion and increase strength.  Sit to stand with min AA by II bars x 5 with maintaining standing for 1 min each trial.  Step ups on 6 in step 3 x10.  Gait training with FWW 2 x 80 feet with min A and verbal cues for step length and clearance, patient present with forward trunk lean and head forward position, difficulty in correcting posture. Sitting EOM activities to increase trunk extension , patient has difficulty with shoulder flexion, attempted scapular retractions ,manual facilitation needed to complete exercise. Patient needed increased rest break times due to fatigue. At the end of this session patient has been left under nursing supervision by nurses desk with quick release belt on . No c/o of pain during this session.   Session II 2min Patient in bed supine, wife in room participated in therapy session, she has a lot of question about disease process and pt's ability to return to [facility], education provided. Gait training g 2 x 75 feet with modA and tactile cues to decrease velocity of gait and increase step length ,correct posture(patient leans forward while shuffling) 2x 15 feet with mod A , AD used FWW. Side stepping R and L .  Standing activities to increase postural control and scapular retraction and midline and erect head position.  Exercises with yellow theraband in sitting to increase muscular strength in UE , correct posture. Manual stretching applied to B Pectoral muscles.  Patient needs increased time to respond to instructions and  gets fatigued very easily, returned to room and transferred to bed with maxA ,all needs within reach ,wife in room.  Therapy Documentation Precautions:  Precautions Precautions: Fall Precaution Comments: hx of multiple falls due to Parkinsons, HOH Restrictions Weight Bearing Restrictions: No   Pain: Pain Assessment Pain Assessment: No/denies pain  See FIM for current functional status  Therapy/Group: Individual Therapy  [name] 10/16/2013, 3:18 PM

## 2013-10-16 NOTE — Progress Notes (Signed)
Patient ID: Joshua Glass, male   DOB: 1932-11-21, 80 y.o.   MRN: [id number]   [facility] MEDICINE & REHABILITATION     PROGRESS NOTE    Subjective/Complaints:  10/16/13.  80 year old patient  admitted with traumatic Left subdural hematoma. Status post craniotomy evacuation of hematoma 10/07/2013  No problems reported. Denies pain. Seemed to sleep well. Wearing nasal cpap Medical problems include type 2 diabetes, and hypertension.  Hospital course, complicated by acute respiratory insufficiency.  Objective: Vital Signs: Blood pressure 170/79, pulse 62, temperature 97.5 F (36.4 C), temperature source Oral, resp. rate 18, height 5\' 10"  (1.778 m), weight 98.567 kg (217 lb 4.8 oz), SpO2 94.00%.   Recent Labs  10/14/13 0546  WBC 11.8*  HGB 12.4*  HCT 36.7*  PLT 247    Recent Labs  10/14/13 0546  NA 143  K 3.0*  CL 102  GLUCOSE 150*  BUN 14  CREATININE 1.01  CALCIUM 8.7   CBG (last 3)   Recent Labs  10/15/13 1632 10/15/13 2051 10/16/13 0736  GLUCAP 113* 169* 143*    Wt Readings from Last 3 Encounters:  10/13/13 98.567 kg (217 lb 4.8 oz)  10/09/13 105 kg (231 lb 7.7 oz)  10/09/13 105 kg (231 lb 7.7 oz)    Patient Vitals for the past 24 hrs:  BP Temp Temp src Pulse Resp SpO2  10/16/13 0525 170/79 mmHg 97.5 F (36.4 C) Oral 62 18 94 %  10/15/13 2100 - - - 73 18 92 %  10/15/13 1638 166/70 mmHg 98.2 F (36.8 C) Oral 73 18 94 %     Intake/Output Summary (Last 24 hours) at 10/16/13 0942 Last data filed at 10/16/13 0600  Gross per 24 hour  Intake    720 ml  Output    375 ml  Net    345 ml     Physical Exam:  Eyes: EOM are normal.  Neck: Normal range of motion. Neck supple. No thyromegaly present.  Cardiovascular:  Cardiac rate controlled. Reg rhythm  Respiratory: Effort normal and breath sounds normal. No respiratory distress.  GI: Soft. Bowel sounds are normal. He exhibits no distension. Non-tender Neurological:  Alert  Skin:  SCDs in  place Craniotomy sites with staples intact - no drainage    Assessment/Plan: 1. Functional deficits secondary to left SDH after fall which require 3+ hours per day of interdisciplinary therapy in a comprehensive inpatient rehab setting.arkinson's disease. Continue Sinemet as directed.  2. Diabetes mellitus with peripheral neuropathy. Latest hemoglobin A1c 8.3. Check blood sugars a.c. and at bedtime. Continue tradjenta 5 mg daily.   -fair control at present 3. Hypertension. Norvasc 10 mg daily, Lasix 20 mg daily. Monitor with increased mobility  4. Hyperlipidemia. Zocor  5. History of atrial fibrillation. No anticoagulants prior to admission. Cardiac rate controlled   -replace potassium  -check monday   LOS (Days) 3 A FACE TO FACE EVALUATION WAS PERFORMED  [name] 10/16/2013 9:40 AM

## 2013-10-16 NOTE — Progress Notes (Signed)
Speech Language Pathology Daily Session Note  Patient Details  Name: GEOGE LAWRANCE MRN: [id number] Date of Birth: Aug 25, 1932  Today's Date: 10/16/2013 Time: 7510-2585 Time Calculation (min): 37 min  Short Term Goals: Week 1: SLP Short Term Goal 1 (Week 1): Patient will utilize external aids to assist in recall of new and daily information with Mod multimodal cues. SLP Short Term Goal 2 (Week 1): Patient will attend to functional task for 5 minutes with Mod verbal cues. SLP Short Term Goal 3 (Week 1): Patient will complete basic problem solving activities with Mod multimodal cues.  SLP Short Term Goal 4 (Week 1): Patient will consume current diet without overt s/s of aspiration with Mod verbal cues to utilize safe swallow strategies. SLP Short Term Goal 5 (Week 1): Patient will initiate verbal expression with Mod multimodal cues.  SLP Short Term Goal 6 (Week 1): Patient will utilize speech intelligibility strategies with Mod multimodal cues at the word level.  Skilled Therapeutic Interventions: Therapeutic intervention complete with short term goals addressed.  Patient seen during [place] to work on compensatory strategies to decrease aspiration risk.  Overall he did well, no overt s/s of aspiration.  He required verbal cues to not take too big of bite and to throat clear intermittently throughout meal.  Continue with current treatment plan.   FIM:  FIM - Eating Eating Activity: 5: Set-up assist for cut food;5: Set-up assist for open containers;5: Supervision/cues  Pain Pain Assessment Pain Assessment: No/denies pain Pain Score: 0-No pain Faces Pain Scale: No hurt  Therapy/Group: Group Therapy  Zeda Gangwer, Rozann Lesches 10/16/2013, 1:25 PM

## 2013-10-16 NOTE — Progress Notes (Signed)
Occupational Therapy Session Note  Patient Details  Name: Joshua Glass MRN: [id number] Date of Birth: May 13, 1933  Today's Date: 10/16/2013 Time: 0730-0830 Time Calculation (min): 60 min   Skilled Therapeutic Interventions/Progress Updates: ADL in bed with overall max A.  Focus on initiation and following instructions.  Patient able to initiate, sequence and manage grooming tasks much easier (only required setup) than the complex sets of bathing and dressing tasks.  Patient appeared to need time to process the instructions and initiation with the first steps of tasks.  Patient also worked on bed mobility with max A.     At end of session, patient left with call bell, phone and bed alarm in place.     Therapy Documentation Precautions:  Precautions Precautions: Fall Precaution Comments: hx of multiple falls due to Parkinsons, HOH Restrictions Weight Bearing Restrictions: No    Pain:denied Pain Assessment Pain Assessment: No/denies pain   Other Treatments:    See FIM for current functional status  Therapy/Group: Individual Therapy  Alfredia Ferguson [facility] 10/16/2013, 2:20 PM

## 2013-10-17 ENCOUNTER — Inpatient Hospital Stay (HOSPITAL_COMMUNITY): Payer: Medicare Other

## 2013-10-17 DIAGNOSIS — G4733 Obstructive sleep apnea (adult) (pediatric): Secondary | ICD-10-CM

## 2013-10-17 DIAGNOSIS — E1142 Type 2 diabetes mellitus with diabetic polyneuropathy: Secondary | ICD-10-CM

## 2013-10-17 LAB — GLUCOSE, CAPILLARY
GLUCOSE-CAPILLARY: 145 mg/dL — AB (ref 70–99)
GLUCOSE-CAPILLARY: 123 mg/dL — AB (ref 70–99)
Glucose-Capillary: 117 mg/dL — ABNORMAL HIGH (ref 70–99)
Glucose-Capillary: 215 mg/dL — ABNORMAL HIGH (ref 70–99)
Glucose-Capillary: 146 mg/dL — ABNORMAL HIGH (ref 70–99)

## 2013-10-17 NOTE — Progress Notes (Signed)
Physical Therapy Session Note  Patient Details  Name: Joshua Glass MRN: [id number] Date of Birth: [birth year]  Today's Date: 10/17/2013 Time: 0905-1005 Time Calculation (min): 60 min  Short Term Goals: Week 1:  PT Short Term Goal 1 (Week 1): Pt to perform bed mobility w/ min A, 50% of time PT Short Term Goal 2 (Week 1): Pt to perform transfers w/ min A, 50% of time PT Short Term Goal 3 (Week 1): Pt to amb 100' w/ min A, 50% of time PT Short Term Goal 4 (Week 1): Pt to sustain sitting balance on various surfaces w/ close(S) PT Short Term Goal 5 (Week 1): Pt to sustain standing balance during functional tasks w/ min A, 50% of time  Skilled Therapeutic Interventions/Progress Updates:  1:1. Pt received semi-reclined in bed, ready for therapy and wife in room. Focus this session on bed mobility, dressing and gross NMR w/ emphasis on initiation during therapeutic tasks.  Throughout tx session pt req step by step cueing w/ HOH assist approx 75% of time to initiate. Pt req max A for t/f sup>sit EOB, mod-max A for t/f sit<>stand and standing balance due to consistent posture lean. Pt req mod A for upper body dressing, but total A for lower body dressing. Pt performed ball toss challenge as well as round of horseshoes, reaching for horseshoes on basketball rim on R side slightly outside BOS  to challenge balance w/out UE support, promote head/trunk extension as well as trunk rotation. Req mod- occasional max A for balance. Pt sitting in w/c at end of session w/ all needs in reach, quick release belt in place and wife in room.   Therapy Documentation Precautions:  Precautions Precautions: Fall Precaution Comments: hx of multiple falls due to Parkinsons, HOH Restrictions Weight Bearing Restrictions: No  See FIM for current functional status  Therapy/Group: Individual Therapy  [name] 10/17/2013, 12:07 PM

## 2013-10-17 NOTE — Progress Notes (Signed)
Patient ID: Joshua Glass, male   DOB: Oct 22, 1932, 80 y.o.   MRN: [id number]   Patient ID: Joshua Glass, male   DOB: [birth year], 80 y.o.   MRN: [id number]   [name]YSICAL MEDICINE & REHABILITATION     PROGRESS NOTE    Subjective/Complaints:  10/17/13.    80 year old patient  admitted with traumatic Left subdural hematoma. Status post craniotomy evacuation of hematoma 10/07/2013  No problems reported. Denies pain. Seemed to sleep well. Wearing nasal cpap Medical problems include type 2 diabetes, and hypertension.  Hospital course, complicated by acute respiratory insufficiency. Comfortable night; wife at bedside this AM  Objective: Vital Signs: Blood pressure 167/76, pulse 67, temperature 98.3 F (36.8 C), temperature source Oral, resp. rate 18, height 5\' 10"  (1.778 m), weight 98.567 kg (217 lb 4.8 oz), SpO2 94.00%.  No results found for this basename: WBC, HGB, HCT, PLT,  in the last 72 hours No results found for this basename: NA, K, CL, CO, GLUCOSE, BUN, CREATININE, CALCIUM,  in the last 72 hours CBG (last 3)   Recent Labs  10/16/13 1640 10/17/13 0447 10/17/13 0733  GLUCAP 120* 117* 145*    Wt Readings from Last 3 Encounters:  10/13/13 98.567 kg (217 lb 4.8 oz)  10/09/13 105 kg (231 lb 7.7 oz)  10/09/13 105 kg (231 lb 7.7 oz)    Patient Vitals for the past 24 hrs:  BP Temp Temp src Pulse Resp SpO2  10/17/13 0611 167/76 mmHg 98.3 F (36.8 C) Oral 67 18 94 %  10/16/13 2310 - - - 69 18 95 %  10/16/13 1534 140/91 mmHg 97.7 F (36.5 C) Oral 78 18 93 %     Intake/Output Summary (Last 24 hours) at 10/17/13 0921 Last data filed at 10/17/13 0811  Gross per 24 hour  Intake    915 ml  Output    425 ml  Net    490 ml     Physical Exam:  Gen- obese, alert Eyes: EOM are normal.  Neck: Normal range of motion. Neck supple. No thyromegaly present.  Cardiovascular:  Cardiac rate controlled. Reg rhythm  Respiratory: Effort normal and breath sounds normal. No  respiratory distress.  GI: Soft. Bowel sounds are normal. He exhibits no distension. Non-tender Neurological:  Alert  Skin:  SCDs in place Craniotomy sites with staples intact - no drainage    Assessment/Plan: 1. Functional deficits secondary to left SDH after fall which require 3+ hours per day of interdisciplinary therapy in a comprehensive inpatient rehab setting.arkinson's disease. Continue Sinemet as directed.  2. Diabetes mellitus with peripheral neuropathy. Latest hemoglobin A1c 8.3. Check blood sugars a.c. and at bedtime. Continue tradjenta 5 mg daily.   -fair control at present 3. Hypertension. Norvasc 10 mg daily, Lasix 20 mg daily. Monitor with increased mobility  4. Hyperlipidemia. Zocor  5. History of atrial fibrillation. No anticoagulants prior to admission. Cardiac rate controlled   -replace potassium  -check monday   LOS (Days) 4 A FACE TO FACE EVALUATION WAS PERFORMED  [name] 10/17/2013 9:21 AM

## 2013-10-18 ENCOUNTER — Encounter (HOSPITAL_COMMUNITY): Payer: Medicare Other

## 2013-10-18 ENCOUNTER — Inpatient Hospital Stay (HOSPITAL_COMMUNITY): Payer: Medicare Other | Admitting: Physical Therapy

## 2013-10-18 ENCOUNTER — Inpatient Hospital Stay (HOSPITAL_COMMUNITY): Payer: Medicare Other

## 2013-10-18 ENCOUNTER — Inpatient Hospital Stay (HOSPITAL_COMMUNITY): Payer: Medicare Other | Admitting: Speech Pathology

## 2013-10-18 DIAGNOSIS — E1149 Type 2 diabetes mellitus with other diabetic neurological complication: Secondary | ICD-10-CM

## 2013-10-18 DIAGNOSIS — S065XAA Traumatic subdural hemorrhage with loss of consciousness status unknown, initial encounter: Secondary | ICD-10-CM

## 2013-10-18 DIAGNOSIS — G20 Parkinson's disease: Secondary | ICD-10-CM

## 2013-10-18 DIAGNOSIS — G40909 Epilepsy, unspecified, not intractable, without status epilepticus: Secondary | ICD-10-CM

## 2013-10-18 DIAGNOSIS — S065X9A Traumatic subdural hemorrhage with loss of consciousness of unspecified duration, initial encounter: Secondary | ICD-10-CM

## 2013-10-18 DIAGNOSIS — E1142 Type 2 diabetes mellitus with diabetic polyneuropathy: Secondary | ICD-10-CM

## 2013-10-18 DIAGNOSIS — R1310 Dysphagia, unspecified: Secondary | ICD-10-CM

## 2013-10-18 DIAGNOSIS — I10 Essential (primary) hypertension: Secondary | ICD-10-CM

## 2013-10-18 LAB — BASIC METABOLIC PANEL
BUN: 9 mg/dL (ref 6–23)
CALCIUM: 9.1 mg/dL (ref 8.4–10.5)
CO2: 29 mEq/L (ref 19–32)
Chloride: 102 mEq/L (ref 96–112)
Creatinine, Ser: 0.91 mg/dL (ref 0.50–1.35)
GFR, EST NON AFRICAN AMERICAN: 79 mL/min — AB (ref >90)
Glucose, Bld: 166 mg/dL — ABNORMAL HIGH (ref 70–99)
Potassium: 3.4 mEq/L — ABNORMAL LOW (ref 3.7–5.3)
Sodium: 143 mEq/L (ref 137–147)

## 2013-10-18 LAB — CBC
HCT: 39 % (ref 39.0–52.0)
Hemoglobin: 13 g/dL (ref 13.0–17.0)
MCH: 28.4 pg (ref 26.0–34.0)
MCHC: 33.3 g/dL (ref 30.0–36.0)
MCV: 85.2 fL (ref 78.0–100.0)
PLATELETS: 214 10*3/uL (ref 150–400)
RBC: 4.58 MIL/uL (ref 4.22–5.81)
RDW: 15.5 % (ref 11.5–15.5)
WBC: 9.4 10*3/uL (ref 4.0–10.5)

## 2013-10-18 LAB — GLUCOSE, CAPILLARY
Glucose-Capillary: 133 mg/dL — ABNORMAL HIGH (ref 70–99)
Glucose-Capillary: 132 mg/dL — ABNORMAL HIGH (ref 70–99)
Glucose-Capillary: 93 mg/dL (ref 70–99)
Glucose-Capillary: 149 mg/dL — ABNORMAL HIGH (ref 70–99)

## 2013-10-18 LAB — PREALBUMIN: Prealbumin: 12.9 mg/dL — ABNORMAL LOW (ref 17.0–34.0)

## 2013-10-18 MED ORDER — POTASSIUM CHLORIDE CRYS ER 20 MEQ PO TBCR
20.0000 meq | EXTENDED_RELEASE_TABLET | Freq: Two times a day (BID) | ORAL | Status: DC
  Administered 2013-10-18 – 2013-10-23 (×10): 20 meq via ORAL
  Filled 2013-10-18 (×13): qty 1

## 2013-10-18 NOTE — Progress Notes (Signed)
[facility] PHYSICAL MEDICINE & REHABILITATION     PROGRESS NOTE    Subjective/Complaints: No problems reported. Denies pain. Seemed to sleep well. Wearing nasal cpap  Objective: Vital Signs: Blood pressure 178/68, pulse 69, temperature 98.3 F (36.8 C), temperature source Oral, resp. rate 17, height 5\' 10"  (1.778 m), weight 98.567 kg (217 lb 4.8 oz), SpO2 94.00%. No results found.  Recent Labs  10/18/13 0803  WBC 9.4  HGB 13.0  HCT 39.0  PLT 214    Recent Labs  10/18/13 0803  NA 143  K 3.4*  CL 102  GLUCOSE 166*  BUN 9  CREATININE 0.91  CALCIUM 9.1   CBG (last 3)   Recent Labs  10/17/13 1632 10/17/13 2204 10/18/13 0732  GLUCAP 123* 146* 133*    Wt Readings from Last 3 Encounters:  10/13/13 98.567 kg (217 lb 4.8 oz)  10/09/13 105 kg (231 lb 7.7 oz)  10/09/13 105 kg (231 lb 7.7 oz)    Physical Exam:  Eyes: EOM are normal.  Neck: Normal range of motion. Neck supple. No thyromegaly present.  Cardiovascular:  Cardiac rate controlled. Reg rhythm  Respiratory: Effort normal and breath sounds normal. No respiratory distress.  GI: Soft. Bowel sounds are normal. He exhibits no distension. Non-tender Neurological:  Slow to arouse/process once again.  Flat facies. He was able to provide his age place as well as date of birth. He did follow simple commands. His speech was a bit dysarthric but intelligible. Slow to process. Rigidity in all 4 limbs. RUE is 3- to 3/5 deltoid, bicep, tricep, HI. LUE is 1+ deltoid/shoulder (RTC), 3- bicep, tricep, 3 hand.Marland Kitchen RLE is 2/5 prox HF, KE, to 1-2 distally at ankle with ADF/APF---inconsistent (but better) initiating movement. LLE 3- HF, 3 KE, ankle. Senses pain throughout on right.  Skin:  Craniotomy sites with staples intact - no drainage Psych: more alert today. Affect more dynamic. occasionallly jokes and kids around   Assessment/Plan: 1. Functional deficits secondary to left SDH after fall which require 3+ hours per day of  interdisciplinary therapy in a comprehensive inpatient rehab setting. Physiatrist is providing close team supervision and 24 hour management of active medical problems listed below. Physiatrist and rehab team continue to assess barriers to discharge/monitor patient progress toward functional and medical goals. FIM: FIM - Bathing Bathing Steps Patient Completed: Chest;Abdomen;Left Arm;Right upper leg;Left upper leg Bathing: 3: Mod-Patient completes 5-7 3f 10 parts or 50-74%  FIM - Upper Body Dressing/Undressing Upper body dressing/undressing steps patient completed: Thread/unthread right sleeve of pullover shirt/dresss;Thread/unthread left sleeve of pullover shirt/dress Upper body dressing/undressing: 3: Mod-Patient completed 50-74% of tasks FIM - Lower Body Dressing/Undressing Lower body dressing/undressing: 1: Total-Patient completed less than 25% of tasks  FIM - Toileting Toileting: 0: Activity did not occur  FIM - Air cabin crew Transfers: 0-Activity did not occur  FIM - Control and instrumentation engineer Devices: Arm rests Bed/Chair Transfer: 2: Bed > Chair or W/C: Max A (lift and lower assist);2: Supine > Sit: Max A (lifting assist/Pt. 25-49%);2: Chair or W/C > Bed: Max A (lift and lower assist)  FIM - Locomotion: Wheelchair Distance: 5', trial both B UE/LE, however, decreased initiation and new learning difficult Locomotion: Wheelchair: 1: Total Assistance/staff pushes wheelchair (Pt<25%) FIM - Locomotion: Ambulation Locomotion: Ambulation Assistive Devices: Administrator Ambulation/Gait Assistance: 4: Min assist Locomotion: Ambulation: 0: Activity did not occur  Comprehension Comprehension Mode: Auditory Comprehension: 4-Understands basic 75 - 89% of the time/requires cueing 10 - 24% of the  time  Expression Expression Mode: Verbal Expression: 2-Expresses basic 25 - 49% of the time/requires cueing 50 - 75% of the time. Uses single  words/gestures.  Social Interaction Social Interaction: 2-Interacts appropriately 25 - 49% of time - Needs frequent redirection.  Problem Solving Problem Solving: 2-Solves basic 25 - 49% of the time - needs direction more than half the time to initiate, plan or complete simple activities  Memory Memory: 3-Recognizes or recalls 50 - 74% of the time/requires cueing 25 - 49% of the time  Medical Problem List and Plan:  1. Left subdural hematoma after a fall. Status post craniotomy evacuation of hematoma 10/07/2013  2. DVT Prophylaxis/Anticoagulation: SCDs. Monitor for any signs of DVT  3. Pain Management: Ultram as needed. Monitor with increased mobility  4. Neuropsych: This patient is not capable of making decisions on his own behalf  5.Dysphagia.Dysphgia 3 nectar liquids.Follow up speech therapy.  6. Seizure prophylaxis. Keppra 500 mg twice a day.  7. Parkinson's disease. Continue Sinemet as directed.  .8 Diabetes mellitus with peripheral neuropathy. Latest hemoglobin A1c 8.3. Check blood sugars a.c. and at bedtime. Continue tradjenta 5 mg daily.   -mid day elevation noted--watch for now 9. Hypertension. Norvasc 10 mg daily, Lasix 20 mg daily. Monitor with increased mobility  10. Hyperlipidemia. Zocor  11. History of atrial fibrillation. No anticoagulants prior to admission. Cardiac rate controlled   -continue to replace potassium 12. Asthma. Singulair 10 mg each bedtime. No shortness of breath reported  13. BPH. Flomax 0.4 mg daily. 14. Hypothyroidism. Synthroid  15. Obstructive sleep apnea. Nasal cpap 16. Leukocytosis---recheck next week--no signs of infection at present--trending down 17. Dysphagia: mulitfactorial D3 thins per SLP  -still not eating much. "doesn't have taste"  -advised wife to encourage him and observe  -will ask RD for recs. Add glucerna tid and hs  -consider megace trial  -full supervision for now    LOS (Days) 5 A FACE TO FACE EVALUATION WAS  PERFORMED  Hao Dion T 10/18/2013 8:58 AM

## 2013-10-18 NOTE — Progress Notes (Signed)
Speech Language Pathology Daily Session Note  Patient Details  Name: Joshua Glass MRN: [id number] Date of Birth: 1932/08/15  Today's Date: 10/18/2013 Time: 0830-0858 Time Calculation (min): 28 min  Short Term Goals: Week 1: SLP Short Term Goal 1 (Week 1): Patient will utilize external aids to assist in recall of new and daily information with Mod multimodal cues. SLP Short Term Goal 2 (Week 1): Patient will attend to functional task for 5 minutes with Mod verbal cues. SLP Short Term Goal 3 (Week 1): Patient will complete basic problem solving activities with Mod multimodal cues.  SLP Short Term Goal 4 (Week 1): Patient will consume current diet without overt s/s of aspiration with Mod verbal cues to utilize safe swallow strategies. SLP Short Term Goal 5 (Week 1): Patient will initiate verbal expression with Mod multimodal cues.  SLP Short Term Goal 6 (Week 1): Patient will utilize speech intelligibility strategies with Mod multimodal cues at the word level.  Skilled Therapeutic Interventions: Skilled treatment session addressed cognitive goals. Student provided overall Max verbal cues for sustained attention for >3 minutes due to increased distraction of family members in room. Patient also required Max verbal cues for initiation and organization to complete basic money management task. Patient required Max multimodal cues to answer questions regarding schedule. Continue with current plan of care.    FIM:  Comprehension Comprehension Mode: Auditory Comprehension: 4-Understands basic 75 - 89% of the time/requires cueing 10 - 24% of the time Expression Expression Mode: Verbal Expression: 2-Expresses basic 25 - 49% of the time/requires cueing 50 - 75% of the time. Uses single words/gestures. Social Interaction Social Interaction: 2-Interacts appropriately 25 - 49% of time - Needs frequent redirection. Problem Solving Problem Solving: 2-Solves basic 25 - 49% of the time - needs direction  more than half the time to initiate, plan or complete simple activities Memory Memory: 2-Recognizes or recalls 25 - 49% of the time/requires cueing 51 - 75% of the time FIM - Eating Eating Activity: 0: Activity did not occur  Pain Pain Assessment Pain Assessment: No/denies pain  Therapy/Group: Individual Therapy  Sharlot Sturkey 10/18/2013, 9:10 AM

## 2013-10-18 NOTE — Progress Notes (Signed)
Physical Therapy Session Note  Patient Details  Name: Joshua Glass MRN: [id number] Date of Birth: 1933-03-25  Today's Date: 10/18/2013 Time: 1030-1130 and 1530-1620 Time Calculation (min): 60 min and 50 min  Short Term Goals: Week 1:  PT Short Term Goal 1 (Week 1): Pt to perform bed mobility w/ min A, 50% of time PT Short Term Goal 2 (Week 1): Pt to perform transfers w/ min A, 50% of time PT Short Term Goal 3 (Week 1): Pt to amb 100' w/ min A, 50% of time PT Short Term Goal 4 (Week 1): Pt to sustain sitting balance on various surfaces w/ close(S) PT Short Term Goal 5 (Week 1): Pt to sustain standing balance during functional tasks w/ min A, 50% of time  Skilled Therapeutic Interventions/Progress Updates:   AM Session: Pt seated in w/c with quick release belt on, wife present. Session focused on initiation of movement, endurance, and sustained attention. Gait training in hallway x 50' with 1 rail and HHA with min-mod A and increased anterior/lateral lean with R HHA. Side stepping to L and R in hallway with BUE support on rail x 10' and max verbal/tactile cues for initiation. Pt performed sit<>stand from w/c and edge of mat with mod A and 25% verbal cues for initiation. Pt performed alternating LE step taps to 6" step using RW and manual facilitation of lateral weightshift R and L to unweight LE in order to step. Pt reclined supine on wedge on mat for passive B pec stretch with overpressure for forward head, rounded shoulders posture and pt unable to hold head upright when cued. Pt transferred mat > NuStep with HHA. NuStep x 6 min BLEs only for initiation of knee flex/ext and sustained attention. Pt unable to sustain attention to task throughout session, requiring frequent verbal cues to continue ambulation/stepping, as well as appearing more lethargic this date, unable to keep eyes from closing 25% of session.  Pt transported to [employer] in w/c at end of session.  PM Session: Pt seated in w/c  with quick release belt on, wife present. Pt appears more alert and engaged. Session with continued focus on initiation, sustained attention, and postural control. Pt performed gait training x 90' and 140' mod A with HHA x 2 and pt with forward trunk and pushing away from R side. Pt requires verbal cues for upright trunk and improved B foot clearance. After seated rest pt ambulated pushing front-weighted grocery cart with BUEs at approx shoulder level on PVC uprights with one therapist supporting L UE (secondary to RTC dysfunction) and one therapist controlling speed of cart x 100'. Pt demo improvement in upright trunk, neck, and head posture and able to maintain neutral head posture for improved interaction with environment. Pt transferred sit<>stand with consistent modA with verbal cues for initiation, anterior weightshift, and hand placement and stand step turn transfer mod A and HHA with max verbal cues for sequencing. Seated on small wedge on mat to facilitate anterior pelvic tilt to with BLEs supported on floor, therapist behind pt providing assist to facilitate lumbar and thoracic extension, scapular retraction, and upright head posture, pt engaged in reaching task with anterior weightshift using R UE to place horseshoes on raised target centered in front of pt. Pt requires frequent verbal cues to initiate task but able to complete without further cuing. Pt required assist to place horseshoes on ground using LUE with increased anxiety/resistance to seated forward flexion/anterior weightshift noted. Pt returned to room and sit >supine in  bed with max A, bed alarm on and wife present.   Therapy Documentation Precautions:  Precautions Precautions: Fall Precaution Comments: hx of multiple falls due to Parkinsons, HOH Restrictions Weight Bearing Restrictions: No Pain: Pain Assessment Pain Assessment: No/denies pain Vitals: 93% Sp02 on RA after ambulation  See FIM for current functional  status  Therapy/Group: Individual Therapy  Laretta Alstrom 10/18/2013, 11:18 AM

## 2013-10-18 NOTE — Progress Notes (Signed)
Occupational Therapy Session Note  Patient Details  Name: Joshua Glass MRN: [id number] Date of Birth: 04-26-1933  Today's Date: 10/18/2013 Time: 1415-1500 Time Calculation (min): 45 min  Short Term Goals: Week 1:  OT Short Term Goal 1 (Week 1): pt will sit on EOB or mat for 5 minutes min (A) OT Short Term Goal 2 (Week 1): Pt will complete sit<>Stand mod (A) OT Short Term Goal 3 (Week 1): Pt will wash UB with Mod (A) OT Short Term Goal 4 (Week 1): Pt will complete oral care min (A)  Skilled Therapeutic Interventions/Progress Updates:  Therapeutic activities with focus on improved sit<>stand, oral care, initiation, and focused attention.   Patient received supine in bed, asleep, with spouse present in room.   Patient required moderate stimulation to improve arousal but remained alert throughout session, verbalizing briefly and accurately to cues and prompts.   Patient required min assist to rise from supine to sitting at edge of bed and attempted to don shoes when presented using leg crossed over method; pt donned both shoes with mod assist sitting at edge of bed approx 5 minutes.   Patient rose to standing with only min assist and completed transfer to w/c, with facilitation for leg/arm placement, while he stepped backwards to contact w/c.    At w/c level, with extra time, patient completed oral care (pt=50%), initiating reach for toothbrush unassisted.   Patient was then escorted to gym and performed functional activities including ball toss, holding and aiming rifle (Wii device), and used tire pump to inflate ball (pt = 70% using just right UE).   Patient was returned to his room, safety belt fastened and RN tech alerted to need for supervision while he wife was away.    Therapy Documentation Precautions:  Precautions Precautions: Fall Precaution Comments: hx of multiple falls due to Parkinsons, HOH Restrictions Weight Bearing Restrictions: No  Vital Signs: Therapy Vitals Temp: 98 F  (36.7 C) Temp src: Oral Pulse Rate: 67 Resp: 16 BP: 168/77 mmHg Patient Position, if appropriate: Sitting Oxygen Therapy SpO2: 98 % O2 Device: None (Room air)  Pain: Pain Assessment Pain Assessment: No/denies pain  ADL: ADL ADL Comments: see FIM  See FIM for current functional status  Therapy/Group: Individual Therapy  [place] 10/18/2013, 3:11 PM

## 2013-10-18 NOTE — Progress Notes (Signed)
Speech Language Pathology Daily Session Note  Patient Details  Name: Joshua Glass MRN: [id number] Date of Birth: 1933/04/05  Today's Date: 10/18/2013 Time: 1130-1145 Time Calculation (min): 15 min  Short Term Goals: Week 1: SLP Short Term Goal 1 (Week 1): Patient will utilize external aids to assist in recall of new and daily information with Mod multimodal cues. SLP Short Term Goal 2 (Week 1): Patient will attend to functional task for 5 minutes with Mod verbal cues. SLP Short Term Goal 3 (Week 1): Patient will complete basic problem solving activities with Mod multimodal cues.  SLP Short Term Goal 4 (Week 1): Patient will consume current diet without overt s/s of aspiration with Mod verbal cues to utilize safe swallow strategies. SLP Short Term Goal 5 (Week 1): Patient will initiate verbal expression with Mod multimodal cues.  SLP Short Term Goal 6 (Week 1): Patient will utilize speech intelligibility strategies with Mod multimodal cues at the word level.  Skilled Therapeutic Interventions: Patient participated in co-treatment with OT in diners club with focus on self-feeding and dysphagia and cognitive goals. SLP facilitated session by providing Max A verbal and tactile cues for initiation of self-feeding and for sustained attention to mastication in a moderately distracting environment. Pt with minimal verbal expression with a decreased vocal intensity which impacted his overall speech intelligibility. Pt also required Max verbal and tactile cues for utilization of swallowing compensatory strategies with lunch meal of Dys. 3 textures with thin liquids. Pt with an intermittent wet vocal quality that cleared with cued throat clears. Continue with current plan of care.    FIM:  Comprehension Comprehension Mode: Auditory Comprehension: 4-Understands basic 75 - 89% of the time/requires cueing 10 - 24% of the time Expression Expression: 2-Expresses basic 25 - 49% of the time/requires cueing 50  - 75% of the time. Uses single words/gestures. Social Interaction Social Interaction: 2-Interacts appropriately 25 - 49% of time - Needs frequent redirection. Problem Solving Problem Solving: 2-Solves basic 25 - 49% of the time - needs direction more than half the time to initiate, plan or complete simple activities Memory Memory: 2-Recognizes or recalls 25 - 49% of the time/requires cueing 51 - 75% of the time FIM - Eating Eating Activity: 5: Supervision/cues  Pain Pain Assessment Pain Assessment: No/denies pain  Therapy/Group: Group Therapy  Amenah Tucci 10/18/2013, 1:36 PM

## 2013-10-18 NOTE — Progress Notes (Signed)
The skilled treatment note has been reviewed and SLP is in agreement.  Syd Newsome, M.A., CCC-SLP  [phone]   

## 2013-10-18 NOTE — Plan of Care (Signed)
Problem: RH BLADDER ELIMINATION Goal: RH STG MANAGE BLADDER WITH ASSISTANCE STG Manage Bladder With minimal Assistance  Outcome: Not Progressing Pt incont. Of bladder. Timed tolieting inplace

## 2013-10-18 NOTE — Progress Notes (Signed)
Occupational Therapy Session Note  Patient Details  Name: Joshua Glass MRN: [id number] Date of Birth: 1932/10/20  Today's Date: 10/18/2013  Session 1 Time: 0900-1000 Time Calculation (min): 60 min  Short Term Goals: Week 1:  OT Short Term Goal 1 (Week 1): pt will sit on EOB or mat for 5 minutes min (A) OT Short Term Goal 2 (Week 1): Pt will complete sit<>Stand mod (A) OT Short Term Goal 3 (Week 1): Pt will wash UB with Mod (A) OT Short Term Goal 4 (Week 1): Pt will complete oral care min (A)  Skilled Therapeutic Interventions/Progress Updates:    Pt engaged in bathing and dressing tasks with sit<>stand from w/c at sink.  Pt resting in bed upon arrival but agreeable to completing tasks at sink.  Pt's wife was present throughout session. Pt requires extra time and max verbal cues to initiate all tasks. Pt required min verbal cues to redirect to tasks and would often stop and stare at sink during tasks.  Pt was unable to complete shaving with electric razor and required total a for task. Pt required min A for sit<>stand and steady A while standing.  Pt's wife stated that typically patient required approx 1 hour for bathing and 40 mins for dressing when at home.  Focus on activity tolerance, sit<>stand, bed mobility, standing balance, task initiation, attention to task, and safety awareness.  Therapy Documentation Precautions:  Precautions Precautions: Fall Precaution Comments: hx of multiple falls due to Parkinsons, HOH Restrictions Weight Bearing Restrictions: No Pain: Pain Assessment Pain Assessment: No/denies pain  See FIM for current functional status  Therapy/Group: Individual Therapy  Session 2 Time: [phone] (cotx with Speech therapy-total time 1130-1205) Pt denies pain Group therapy  Pt participated in self feeding group with focus on task initiation, attention to task, and swallowing precaution.  Pt required extra time to initiate self feeding tasks and mod verbal cues  to redirect to task.  Pt required max verbal cues to adhere to swallowing precautions and portion control throughout meal.    Leroy Libman 10/18/2013, 10:10 AM

## 2013-10-18 NOTE — Progress Notes (Signed)
Inpatient [facility] Individual Statement of Services  Patient Name:  Joshua Glass  Date:  10/18/2013  Welcome to the [facility].  Our goal is to provide you with an individualized program based on your diagnosis and situation, designed to meet your specific needs.  With this comprehensive rehabilitation program, you will be expected to participate in at least 3 hours of rehabilitation therapies Monday-Friday, with modified therapy programming on the weekends.  Your rehabilitation program will include the following services:  Physical Therapy (PT), Occupational Therapy (OT), Speech Therapy (ST), 24 hour per day rehabilitation nursing, Therapeutic Recreaction (TR), Neuropsychology, Case Management (Social Worker), Rehabilitation Medicine, Nutrition Services and Pharmacy Services  Weekly team conferences will be held on Tuesdays to discuss your progress.  Your Social Worker will talk with you frequently to get your input and to update you on team discussions.  Team conferences with you and your family in attendance may also be held.  Expected length of stay: 10-14 days  Overall anticipated outcome: minimal assistance  Depending on your progress and recovery, your program may change. Your Social Worker will coordinate services and will keep you informed of any changes. Your Social Worker's name and contact numbers are listed  below.  The following services may also be recommended but are not provided by the [facility]:    [place] will be made to provide these services after discharge if needed.  Arrangements include referral to agencies that provide these services.  Your insurance has been verified to be:  [employer] and [place] Your primary doctor is:  Dr. Lorene Dy  Pertinent information will be shared with your doctor and your insurance  company.  Social Worker:  [place], [place] or ([phone]   Information discussed with and copy given to patient by: Lennart Pall, 10/18/2013, 10:34 AM

## 2013-10-19 ENCOUNTER — Inpatient Hospital Stay (HOSPITAL_COMMUNITY): Payer: Medicare Other | Admitting: Physical Therapy

## 2013-10-19 ENCOUNTER — Encounter (HOSPITAL_COMMUNITY): Payer: Medicare Other

## 2013-10-19 ENCOUNTER — Inpatient Hospital Stay (HOSPITAL_COMMUNITY): Payer: Medicare Other

## 2013-10-19 ENCOUNTER — Inpatient Hospital Stay (HOSPITAL_COMMUNITY): Payer: Medicare Other | Admitting: Speech Pathology

## 2013-10-19 DIAGNOSIS — G40909 Epilepsy, unspecified, not intractable, without status epilepticus: Secondary | ICD-10-CM

## 2013-10-19 DIAGNOSIS — S065X9A Traumatic subdural hemorrhage with loss of consciousness of unspecified duration, initial encounter: Secondary | ICD-10-CM

## 2013-10-19 DIAGNOSIS — I10 Essential (primary) hypertension: Secondary | ICD-10-CM

## 2013-10-19 DIAGNOSIS — G20 Parkinson's disease: Secondary | ICD-10-CM

## 2013-10-19 DIAGNOSIS — E1149 Type 2 diabetes mellitus with other diabetic neurological complication: Secondary | ICD-10-CM

## 2013-10-19 DIAGNOSIS — S065XAA Traumatic subdural hemorrhage with loss of consciousness status unknown, initial encounter: Secondary | ICD-10-CM

## 2013-10-19 DIAGNOSIS — R1310 Dysphagia, unspecified: Secondary | ICD-10-CM

## 2013-10-19 DIAGNOSIS — E1142 Type 2 diabetes mellitus with diabetic polyneuropathy: Secondary | ICD-10-CM

## 2013-10-19 LAB — GLUCOSE, CAPILLARY
GLUCOSE-CAPILLARY: 135 mg/dL — AB (ref 70–99)
Glucose-Capillary: 118 mg/dL — ABNORMAL HIGH (ref 70–99)
Glucose-Capillary: 154 mg/dL — ABNORMAL HIGH (ref 70–99)
Glucose-Capillary: 146 mg/dL — ABNORMAL HIGH (ref 70–99)

## 2013-10-19 MED ORDER — MEGESTROL ACETATE 400 MG/10ML PO SUSP
400.0000 mg | Freq: Two times a day (BID) | ORAL | Status: DC
  Administered 2013-10-19 – 2013-10-26 (×16): 400 mg via ORAL
  Filled 2013-10-19 (×20): qty 10

## 2013-10-19 NOTE — Progress Notes (Addendum)
Physical Therapy Session Note  Patient Details  Name: Joshua Glass MRN: [id number] Date of Birth: 1933-03-08  Today's Date: 10/19/2013 Time: [phone] and 1002-1100 Time Calculation (min): 42 min and 58 min  Short Term Goals: Week 1:  PT Short Term Goal 1 (Week 1): Pt to perform bed mobility w/ min A, 50% of time PT Short Term Goal 2 (Week 1): Pt to perform transfers w/ min A, 50% of time PT Short Term Goal 3 (Week 1): Pt to amb 100' w/ min A, 50% of time PT Short Term Goal 4 (Week 1): Pt to sustain sitting balance on various surfaces w/ close(S) PT Short Term Goal 5 (Week 1): Pt to sustain standing balance during functional tasks w/ min A, 50% of time  Skilled Therapeutic Interventions/Progress Updates:   Session 1: Pt sitting in w/c at RN station. Treatment session focused on w/c mobility and bed mobility for improved initiation and sequencing. Pt propelled w/c 25' using R UE and BLEs with cues for step-by-step sequencing and HOH instruction facilitating push and B knee flex/ext to assist with steering/propulsion, with pt initiating 25% of time. Pt transferred w/c <> mat stand pivot min A, sit <> supine max A. On mat pt practiced rolling R/L requiring 100% tactile cues at shoulder/hip for sequencing and verbal cues for initiation and bridging to reposition hips on mat. Pt returned to room and bed with NT assisting in changing pt's brief as pt was noted to be incontinent of bowel. Pt required min A to roll R/L using bed rails for changing brief. Pt left semi reclined in bed with all needs within reach and bed alarm on.   Session 2: Pt supine in bed, agreeable to therapy. Pt transferred supine > sit with max A and stand pivot bed > w/c min A with HHA. Pt ambulated 90 ft x 2 using weighted grocery cart holding onto PVC uprights with B UEs and min guard with therapist controlling speed/direction of cart. Pt demo improved upright trunk posture with cart and L foot drag increasing as pt fatigued. Pt  negotiated up/down 10 steps using 2 rails with min A and step-to pattern going up leading L LE/going down leading R LE. Attempted to cue pt to lead going up with stronger LE; pt unable to initiate. Pt continues to transfer stand > sit with uncontrolled descent and posterior lean. To faciliate anterior weightshift and hip flexion, pt seated edge of mat attempting to reach shoe laces and rolling large ball fwd/back in front of pt using B UEs with difficulty secondary to body habitus and fear of falling. Pt performed sit<>stand x 3 with min-mod A for stand > sit with increased ant weightshift/hip flexion. Pt transferred mat > w/c using RW and minA and returned to RN station in w/c with quick release belt on.   Therapy Documentation Precautions:  Precautions Precautions: Fall Precaution Comments: hx of multiple falls due to Parkinsons, HOH Restrictions Weight Bearing Restrictions: No Pain: Pain Assessment Pain Assessment: No/denies pain  See FIM for current functional status  Therapy/Group: Individual Therapy  Laretta Alstrom 10/19/2013, 10:19 AM

## 2013-10-19 NOTE — Progress Notes (Signed)
The skilled treatment note has been reviewed and SLP is in agreement.  Tiffiney Sparrow, M.A., CCC-SLP  [phone]   

## 2013-10-19 NOTE — Progress Notes (Signed)
[facility] PHYSICAL MEDICINE & REHABILITATION     PROGRESS NOTE    Subjective/Complaints: Not feeling quite as well this am but patient can't specify. Denies pain, cough, sob  Objective: Vital Signs: Blood pressure 167/69, pulse 57, temperature 97.7 F (36.5 C), temperature source Oral, resp. rate 18, height 5\' 10"  (1.778 m), weight 98.567 kg (217 lb 4.8 oz), SpO2 100.00%. No results found.  Recent Labs  10/18/13 0803  WBC 9.4  HGB 13.0  HCT 39.0  PLT 214    Recent Labs  10/18/13 0803  NA 143  K 3.4*  CL 102  GLUCOSE 166*  BUN 9  CREATININE 0.91  CALCIUM 9.1   CBG (last 3)   Recent Labs  10/18/13 1621 10/18/13 2043 10/19/13 0715  GLUCAP 93 149* 118*    Wt Readings from Last 3 Encounters:  10/13/13 98.567 kg (217 lb 4.8 oz)  10/09/13 105 kg (231 lb 7.7 oz)  10/09/13 105 kg (231 lb 7.7 oz)    Physical Exam:  Eyes: EOM are normal.  Neck: Normal range of motion. Neck supple. No thyromegaly present.  Cardiovascular:  Cardiac rate controlled. Reg rhythm  Respiratory: Effort normal and breath sounds normal. No respiratory distress.  GI: Soft. Bowel sounds are normal. He exhibits no distension. Non-tender Neurological:  Quite alert. Delayed processing.  Flat facies. He was able to provide his age place as well as date of birth. He did follow simple commands. His speech was a bit dysarthric but intelligible. Slow to process. Rigidity in all 4 limbs. RUE is 3- to 3/5 deltoid, bicep, tricep, HI. LUE is 1+ deltoid/shoulder (RTC), 3- bicep, tricep, 3 hand.Marland Kitchen RLE is 2/5 prox HF, KE, to 1-2 distally at ankle with ADF/APF---inconsistent (but better) initiating movement. LLE 3- HF, 3 KE, ankle. Senses pain throughout on right.  Skin:  Craniotomy site clean, staples out Psych: more alert today. Affect more dynamic. occasionallly jokes and kids around   Assessment/Plan: 1. Functional deficits secondary to left SDH after fall which require 3+ hours per day of  interdisciplinary therapy in a comprehensive inpatient rehab setting. Physiatrist is providing close team supervision and 24 hour management of active medical problems listed below. Physiatrist and rehab team continue to assess barriers to discharge/monitor patient progress toward functional and medical goals. FIM: FIM - Bathing Bathing Steps Patient Completed: Chest;Abdomen;Front perineal area;Left upper leg;Right upper leg Bathing: 3: Mod-Patient completes 5-7 45f 10 parts or 50-74%  FIM - Upper Body Dressing/Undressing Upper body dressing/undressing steps patient completed: Thread/unthread left sleeve of pullover shirt/dress Upper body dressing/undressing: 2: Max-Patient completed 25-49% of tasks FIM - Lower Body Dressing/Undressing Lower body dressing/undressing: 1: Total-Patient completed less than 25% of tasks  FIM - Toileting Toileting: 0: Activity did not occur  FIM - Air cabin crew Transfers: 0-Activity did not occur  FIM - Control and instrumentation engineer Devices: Arm rests Bed/Chair Transfer: 3: Bed > Chair or W/C: Mod A (lift or lower assist);3: Chair or W/C > Bed: Mod A (lift or lower assist);2: Sit > Supine: Max A (lifting assist/Pt. 25-49%)  FIM - Locomotion: Wheelchair Distance: 15' Locomotion: Wheelchair: 1: Total Assistance/staff pushes wheelchair (Pt<25%) FIM - Locomotion: Ambulation Locomotion: Ambulation Assistive Devices: Other (comment) (HHA x 2) Ambulation/Gait Assistance: 1: +2 Total assist Locomotion: Ambulation: 1: Two helpers  Comprehension Comprehension Mode: Auditory Comprehension: 4-Understands basic 75 - 89% of the time/requires cueing 10 - 24% of the time  Expression Expression Mode: Verbal Expression: 2-Expresses basic 25 - 49% of the time/requires  cueing 50 - 75% of the time. Uses single words/gestures.  Social Interaction Social Interaction: 2-Interacts appropriately 25 - 49% of time - Needs frequent  redirection.  Problem Solving Problem Solving: 2-Solves basic 25 - 49% of the time - needs direction more than half the time to initiate, plan or complete simple activities  Memory Memory: 2-Recognizes or recalls 25 - 49% of the time/requires cueing 51 - 75% of the time  Medical Problem List and Plan:  1. Left subdural hematoma after a fall. Status post craniotomy evacuation of hematoma 10/07/2013  2. DVT Prophylaxis/Anticoagulation: SCDs. Monitor for any signs of DVT  3. Pain Management: Ultram as needed. Monitor with increased mobility  4. Neuropsych: This patient is not capable of making decisions on his own behalf  5.Dysphagia.Dysphgia 3 nectar liquids.Follow up speech therapy.  6. Seizure prophylaxis. Keppra 500 mg twice a day.  7. Parkinson's disease. Continue Sinemet as directed.  .8 Diabetes mellitus with peripheral neuropathy. Latest hemoglobin A1c 8.3. Check blood sugars a.c. and at bedtime. Continue tradjenta 5 mg daily.   -fairly balanced yesterday 9. Hypertension. Norvasc 10 mg daily, Lasix 20 mg daily. Monitor with increased mobility  10. Hyperlipidemia. Zocor  11. History of atrial fibrillation. No anticoagulants prior to admission. Cardiac rate controlled   -continue to replace potassium 12. Asthma. Singulair 10 mg each bedtime. No shortness of breath reported  13. BPH. Flomax 0.4 mg daily. 14. Hypothyroidism. Synthroid  15. Obstructive sleep apnea. Nasal cpap 16. Leukocytosis---recheck next week--no signs of infection at present--trending down 17. Dysphagia: mulitfactorial D3 thins per SLP  -eating a little better  -advised wife to encourage him and observe  -will ask RD for recs. Add glucerna tid and hs  -add megace  -full supervision for now    LOS (Days) 6 A FACE TO FACE EVALUATION WAS PERFORMED  SWARTZ,ZACHARY T 10/19/2013 9:03 AM

## 2013-10-19 NOTE — Progress Notes (Signed)
Occupational Therapy Session Note  Patient Details  Name: LUIS NICKLES MRN: [id number] Date of Birth: 1932-08-03  Today's Date: 10/19/2013 Time: 0800-0900 Time Calculation (min): 60 min  Short Term Goals: Week 1:  OT Short Term Goal 1 (Week 1): pt will sit on EOB or mat for 5 minutes min (A) OT Short Term Goal 2 (Week 1): Pt will complete sit<>Stand mod (A) OT Short Term Goal 3 (Week 1): Pt will wash UB with Mod (A) OT Short Term Goal 4 (Week 1): Pt will complete oral care min (A)  Skilled Therapeutic Interventions/Progress Updates:    Pt engaged in ADL retraining including bathing and dressing with sit<>stand from w/c at sink.  Pt continues to require extra time to initiate tasks with occasional tactile cues to initiate.  Pt often terminates task before completion and requires verbal/tactile cues to reinitiate tasks.  Pt initially required max A for sit->stand from EOB but progressively improved with ability to perform sit<>stand requiring only min A.  Pt requires max A for LB bathing and dressing.  Focus on activity tolerance, task initiation, attention to task, sit<>stand, standing balance, and safety awareness.  Therapy Documentation Precautions:  Precautions Precautions: Fall Precaution Comments: hx of multiple falls due to Parkinsons, HOH Restrictions Weight Bearing Restrictions: No Pain: Pain Assessment Pain Assessment: No/denies pain  See FIM for current functional status  Therapy/Group: Individual Therapy  Leroy Libman 10/19/2013, 9:05 AM

## 2013-10-19 NOTE — Patient Care Conference (Signed)
Inpatient RehabilitationTeam Conference and Plan of Care Update Date: 10/19/2013   Time: 10:35 AM    Patient Name: Joshua Glass      Medical Record Number: [id number]  Date of Birth: [birth year] Sex: Male         Room/Bed: 4W13C/4W13C-01 Payor Info: Payor: Holley Bouche MEDICARE / Plan: [employer] MEDICARE / Product Type: *No Product type* /    Admitting Diagnosis: L SDH crani evacuation s/p fall  Admit Date/Time:  10/13/2013  5:17 PM Admission Comments: No comment available   Primary Diagnosis:  <principal problem not specified> Principal Problem: <principal problem not specified>  Patient Active Problem List   Diagnosis Date Noted  . Diabetes mellitus type 1, uncontrolled, with neuro complications 92/05/9416  . Subdural hematoma, post-traumatic 10/07/2013  . Acute bronchitis 09/26/2013  . Gout 09/26/2013  . Fever 09/25/2013  . Sepsis 09/23/2013  . Dysphagia 09/23/2013  . Respiratory failure 09/22/2013  . Parkinson's syndrome 04/22/2013  . Abnormality of gait 04/22/2013  . Polyneuropathy in diabetes(357.2) 04/22/2013  . Complete tear of right rotator cuff 10/10/2011  . Gallstone   . SDH (subdural hematoma) 10/05/2011  . Fall 10/05/2011  . DIABETES MELLITUS 03/29/2008  . OBSTRUCTIVE SLEEP APNEA 03/29/2008  . HYPERTENSION 03/29/2008  . ALLERGIC RHINITIS 03/29/2008    Expected Discharge Date: Expected Discharge Date: 10/26/13  Team Members Present: Physician leading conference: Dr. Alger Simons Social Worker Present: Lennart Pall, LCSW Nurse Present: Janyth Pupa, RN PT Present: Melene Plan, Cottie Banda, PT OT Present: Roanna Epley, COTA;Other (comment);Forde Radon, OT (Debroah Baller, OT) SLP Present: Weston Anna, SLP PPS Coordinator present : Ileana Ladd, PT     Current Status/Progress Goal Weekly Team Focus  Medical   left SDH after fall. Hx of PD  improve initiation and engagement  improve intake and appettie. sleep rx   Bowel/Bladder   Pt incont.  of bowel and bladder. LBM 3/29 Condom cath HS  become continent of bowel and bladder with minimal assist  timed tolieting inplace   Swallow/Nutrition/ Hydration   Dys. 3 with thin liquids via cup with full supervision, Mod-Max A for utilization of swallowing compensatory strategies  supervision   increased utilization of swallowing compensatory strategies   ADL's   max A BADLs, mod A transfers; min A standing balance; extra time initiate tasks;   min A overall  task initiation, activity tolerance, BADLs, transfers   Mobility   Mod-Max A for initiation, Min-mod A for bed mobility, transfers, ambulation, standing balance, stairs  Min A overall  Initiation, awareness, safety during functional mobility, gross NMR, standing balance   Communication   low vocal intensity with Max cues for increased intelligibility  Min A  increasing vocal intensity, initiation of verbal expression   Safety/Cognition/ Behavioral Observations  Max for initiation, problem solving, and sustained attention for 5 minutes  Min A  initiation and sustained attention.   Pain   pt complains of h/a at times. tylenol PRN  3 or less  monitor pain and medicate as needed   Skin   incision to scalp approximated. staples removed. rash to posterior thighs, and back-cream as ordered. stage II to sacrum-foam intact.   no further skin breakdown. Remian free of infection  assess skin qshift, turn q2hrs and keep dry    Rehab Goals Patient on target to meet rehab goals: Yes *See Care Plan and progress notes for long and short-term goals.  Barriers to Discharge: premobrid deficits    Possible Resolutions to Barriers:  supervision and physical  assist at home    Discharge Planning/Teaching Needs:  home with wife providing 24/7 assist vs likely need to change plan to SNF due to extensive care needs      Team Discussion:  Chronic movement and swallowing issues.  Extremely poor initiation.  Minimal assist goals are likely to high and  expect will need to downgrade some to moderate assist.  Max assist to initiate all activities.  Very doubtful that wife can meet care needs - SW to follow up.  Revisions to Treatment Plan:  None at this time.   Continued Need for Acute Rehabilitation Level of Care: The patient requires daily medical management by a physician with specialized training in physical medicine and rehabilitation for the following conditions: Daily direction of a multidisciplinary physical rehabilitation program to ensure safe treatment while eliciting the highest outcome that is of practical value to the patient.: Yes Daily medical management of patient stability for increased activity during participation in an intensive rehabilitation regime.: Yes Daily analysis of laboratory values and/or radiology reports with any subsequent need for medication adjustment of medical intervention for : Neurological problems;Post surgical problems  Markan Cazarez 10/19/2013, 3:06 PM

## 2013-10-19 NOTE — Progress Notes (Signed)
Speech Language Pathology Daily Session Note  Patient Details  Name: Joshua Glass MRN: [id number] Date of Birth: [birth year]  Today's Date: 10/19/2013 Time: 9509-3267 Time Calculation (min): 55 min  Short Term Goals: Week 1: SLP Short Term Goal 1 (Week 1): Patient will utilize external aids to assist in recall of new and daily information with Mod multimodal cues. SLP Short Term Goal 2 (Week 1): Patient will attend to functional task for 5 minutes with Mod verbal cues. SLP Short Term Goal 3 (Week 1): Patient will complete basic problem solving activities with Mod multimodal cues.  SLP Short Term Goal 4 (Week 1): Patient will consume current diet without overt s/s of aspiration with Mod verbal cues to utilize safe swallow strategies. SLP Short Term Goal 5 (Week 1): Patient will initiate verbal expression with Mod multimodal cues.  SLP Short Term Goal 6 (Week 1): Patient will utilize speech intelligibility strategies with Mod multimodal cues at the word level.  Skilled Therapeutic Interventions: Skilled therapeutic session addressed cognitive goals. Student provided Min verbal cues to initiate turn taking in a card game however; patient required Max verbal cues for verbal initiation and naming in a structured back and forth naming task. Suspect decreased verbal initiation at end of session was due to fatigue. Continue with current plan of care.    FIM:  Comprehension Comprehension Mode: Auditory Comprehension: 4-Understands basic 75 - 89% of the time/requires cueing 10 - 24% of the time Expression Expression Mode: Verbal Expression: 2-Expresses basic 25 - 49% of the time/requires cueing 50 - 75% of the time. Uses single words/gestures. Social Interaction Social Interaction: 2-Interacts appropriately 25 - 49% of time - Needs frequent redirection. Problem Solving Problem Solving: 2-Solves basic 25 - 49% of the time - needs direction more than half the time to initiate, plan or complete  simple activities Memory Memory: 2-Recognizes or recalls 25 - 49% of the time/requires cueing 51 - 75% of the time FIM - Eating Eating Activity: 0: Activity did not occur  Pain Pain Assessment Pain Assessment: No/denies pain  Therapy/Group: Individual Therapy  Joshua Glass 10/19/2013, 3:43 PM

## 2013-10-19 NOTE — Progress Notes (Signed)
Physical Therapy Session Note  Patient Details  Name: Joshua Glass MRN: [id number] Date of Birth: 1933/05/14  Today's Date: 10/19/2013 Time: 1300-1400 Time Calculation (min): 60 min  Short Term Goals: Week 1:  PT Short Term Goal 1 (Week 1): Pt to perform bed mobility w/ min A, 50% of time PT Short Term Goal 2 (Week 1): Pt to perform transfers w/ min A, 50% of time PT Short Term Goal 3 (Week 1): Pt to amb 100' w/ min A, 50% of time PT Short Term Goal 4 (Week 1): Pt to sustain sitting balance on various surfaces w/ close(S) PT Short Term Goal 5 (Week 1): Pt to sustain standing balance during functional tasks w/ min A, 50% of time  Skilled Therapeutic Interventions/Progress Updates:  1:1. Pt received sitting in w/c, ready for therapy. Focus this session on ambulation, functional transfers, standing balance/endurane and toileting. Pt req mod-max for amb 30'x2 on hard level and carpeted surfaces due to progressive shuffled steps and strong anterior lean req physical assist to correct or stop. Standing balance/endurance targeted during horticulture activity on elevated table to encourage erect trunk, pt initially req mod A decreasing to min A while managing/watering plants. Practiced multiple t/f sit<>stand for initiation and seq, req consistent mod A. Pt found to be incontinent of bowel, req min-mod A for transfer to toilet and max A for management of clothing/pericare. Pt sitting in w/c at end of session w/ all needs in reach, quick release belt in place and wife in room.   Therapy Documentation Precautions:  Precautions Precautions: Fall Precaution Comments: hx of multiple falls due to Parkinsons, HOH Restrictions Weight Bearing Restrictions: No Pain: Pain Assessment Pain Assessment: No/denies pain  See FIM for current functional status  Therapy/Group: Individual Therapy  [name] 10/19/2013, 3:53 PM

## 2013-10-19 NOTE — Discharge Instructions (Signed)
Inpatient Rehab Discharge Instructions  GABREAL WORTON Discharge date and time: No discharge date for patient encounter.   Activities/Precautions/ Functional Status: Activity: activity as tolerated Diet: diabetic diet Wound Care: none needed Functional status:  ___ No restrictions     ___ Walk up steps independently _x__ 24/7 supervision/assistance   ___ Walk up steps with assistance ___ Intermittent supervision/assistance  ___ Bathe/dress independently ___ Walk with walker     ___ Bathe/dress with assistance ___ Walk Independently    ___ Shower independently _x__ Walk with assistance    ___ Shower with assistance ___ No alcohol     ___ Return to work/school ________  Special Instructions:    My questions have been answered and I understand these instructions. I will adhere to these goals and the provided educational materials after my discharge from the hospital.  Patient/Caregiver Signature _______________________________ Date __________  Clinician Signature _______________________________________ Date __________  Please bring this form and your medication list with you to all your follow-up doctor's appointments.

## 2013-10-20 ENCOUNTER — Encounter (HOSPITAL_COMMUNITY): Payer: Medicare Other

## 2013-10-20 ENCOUNTER — Inpatient Hospital Stay (HOSPITAL_COMMUNITY): Payer: Medicare Other

## 2013-10-20 ENCOUNTER — Inpatient Hospital Stay (HOSPITAL_COMMUNITY): Payer: Medicare Other | Admitting: Speech Pathology

## 2013-10-20 ENCOUNTER — Inpatient Hospital Stay (HOSPITAL_COMMUNITY): Payer: Medicare Other | Admitting: Physical Therapy

## 2013-10-20 DIAGNOSIS — R1310 Dysphagia, unspecified: Secondary | ICD-10-CM

## 2013-10-20 DIAGNOSIS — G40919 Epilepsy, unspecified, intractable, without status epilepticus: Secondary | ICD-10-CM

## 2013-10-20 DIAGNOSIS — S065X9A Traumatic subdural hemorrhage with loss of consciousness of unspecified duration, initial encounter: Secondary | ICD-10-CM

## 2013-10-20 DIAGNOSIS — E1142 Type 2 diabetes mellitus with diabetic polyneuropathy: Secondary | ICD-10-CM

## 2013-10-20 DIAGNOSIS — G20 Parkinson's disease: Secondary | ICD-10-CM

## 2013-10-20 DIAGNOSIS — E1149 Type 2 diabetes mellitus with other diabetic neurological complication: Secondary | ICD-10-CM

## 2013-10-20 DIAGNOSIS — I10 Essential (primary) hypertension: Secondary | ICD-10-CM

## 2013-10-20 DIAGNOSIS — S065XAA Traumatic subdural hemorrhage with loss of consciousness status unknown, initial encounter: Secondary | ICD-10-CM

## 2013-10-20 LAB — GLUCOSE, CAPILLARY
GLUCOSE-CAPILLARY: 122 mg/dL — AB (ref 70–99)
GLUCOSE-CAPILLARY: 132 mg/dL — AB (ref 70–99)
Glucose-Capillary: 114 mg/dL — ABNORMAL HIGH (ref 70–99)
Glucose-Capillary: 148 mg/dL — ABNORMAL HIGH (ref 70–99)

## 2013-10-20 NOTE — Progress Notes (Signed)
Occupational Therapy Session Note  Patient Details  Name: Joshua Glass MRN: [id number] Date of Birth: 05/07/1933  Today's Date: 10/20/2013  Session 1 Time: 0800-0911 Time Calculation (min): 71 min  Short Term Goals: Week 1:  OT Short Term Goal 1 (Week 1): pt will sit on EOB or mat for 5 minutes min (A) OT Short Term Goal 2 (Week 1): Pt will complete sit<>Stand mod (A) OT Short Term Goal 3 (Week 1): Pt will wash UB with Mod (A) OT Short Term Goal 4 (Week 1): Pt will complete oral care min (A)  Skilled Therapeutic Interventions/Progress Updates:    Pt engaged in BADL retraining including bathing at shower level and dressing with sit<>stand from w/c at sink.  Pt requires extra time to initiate all tasks.  Pt required mod verbal cues for sequencing when amb with RW to w/c and for transfers.  Focus on activity tolerance, task initiation, sequencing, sit<>stand, standing balance, and safety awareness.  Therapy Documentation Precautions:  Precautions Precautions: Fall Precaution Comments: hx of multiple falls due to Parkinsons, HOH Restrictions Weight Bearing Restrictions: No Pain: Pain Assessment Pain Assessment: No/denies pain  See FIM for current functional status  Therapy/Group: Individual Therapy  Session 2 Time: 1130-1200 Pt denies pain Individual Therapy  Pt engaged in dynamic standing activities at hi-lo table in therapy gym.  Pt stood 1 x 10 mins, 1X 7 mins, and 1 x 3 mins.  Pt required min A for sit<>stand X 2 and supervision X 1.  Pt required min verbal cues to speak louder during session.  Pt returned to room with wife at side.   Leotis Shames [facility] 10/20/2013, 9:16 AM

## 2013-10-20 NOTE — Progress Notes (Signed)
The skilled treatment note has been reviewed and SLP is in agreement.  Tylar Merendino, M.A., CCC-SLP  [phone]   

## 2013-10-20 NOTE — Progress Notes (Signed)
Speech Language Pathology Daily Session Note  Patient Details  Name: Joshua Glass MRN: [id number] Date of Birth: [birth year]  Today's Date: 10/20/2013 Time: 1418-1500 Time Calculation (min): 42 min  Short Term Goals: Week 1: SLP Short Term Goal 1 (Week 1): Patient will utilize external aids to assist in recall of new and daily information with Mod multimodal cues. SLP Short Term Goal 2 (Week 1): Patient will attend to functional task for 5 minutes with Mod verbal cues. SLP Short Term Goal 3 (Week 1): Patient will complete basic problem solving activities with Mod multimodal cues.  SLP Short Term Goal 4 (Week 1): Patient will consume current diet without overt s/s of aspiration with Mod verbal cues to utilize safe swallow strategies. SLP Short Term Goal 5 (Week 1): Patient will initiate verbal expression with Mod multimodal cues.  SLP Short Term Goal 6 (Week 1): Patient will utilize speech intelligibility strategies with Mod multimodal cues at the word level.  Skilled Therapeutic Interventions: Skilled therapeutic intervention addressed cognitive-linguistic goals. Student provided Mod multimodal cues to complete a 4 step sequencing task with verbal descriptions using picture cards. Patient independently initiated turn taking and required Mod question cues to utilize speech intelligibility strategies at the phrase level throughout the session. Continue with current plan of care.    FIM:  Comprehension Comprehension Mode: Auditory Comprehension: 5-Follows basic conversation/direction: With extra time/assistive device Expression Expression Mode: Verbal Expression: 3-Expresses basic 50 - 74% of the time/requires cueing 25 - 50% of the time. Needs to repeat parts of sentences. Social Interaction Social Interaction: 4-Interacts appropriately 75 - 89% of the time - Needs redirection for appropriate language or to initiate interaction. Problem Solving Problem Solving: 3-Solves basic 50 - 74% of  the time/requires cueing 25 - 49% of the time Memory Memory: 2-Recognizes or recalls 25 - 49% of the time/requires cueing 51 - 75% of the time FIM - Eating Eating Activity: 0: Activity did not occur  Pain Pain Assessment Pain Assessment: No/denies pain  Therapy/Group: Individual Therapy  Neco Kling 10/20/2013, 3:29 PM

## 2013-10-20 NOTE — Progress Notes (Signed)
Occupational Therapy Weekly Progress Note  Patient Details  Name: Joshua Glass MRN: [id number] Date of Birth: 1933/05/06  Today's Date: 10/20/2013  Patient has met 4 of 4 short term goals.  Pt has progressed slowly with BADLs since admission.  Pt requires extra time to initiate and complete all tasks.  Pt requires min to mod A for sit<>stand and min/mod A for toilet and shower transfers.  Pt's wife has been present for therapy sessions and has voiced concerns regarding her ability to assist patient at home.  Pt's wife has been receptive to looking at SNF placement for continued therapy after d/c from hospital.  See LTGs for changes. Patient continues to demonstrate the following deficits: initiation, delay response, decr balance, cognitive deficit and therefore will continue to benefit from skilled OT intervention to enhance overall performance with BADL.  Patient progressing toward long term goals..  Continue plan of care.  OT Short Term Goals Week 1:  OT Short Term Goal 1 (Week 1): pt will sit on EOB or mat for 5 minutes min (A) OT Short Term Goal 1 - Progress (Week 1): Met OT Short Term Goal 2 (Week 1): Pt will complete sit<>Stand mod (A) OT Short Term Goal 2 - Progress (Week 1): Met OT Short Term Goal 3 (Week 1): Pt will wash UB with Mod (A) OT Short Term Goal 3 - Progress (Week 1): Met OT Short Term Goal 4 (Week 1): Pt will complete oral care min (A) OT Short Term Goal 4 - Progress (Week 1): Met Week 2:  OT Short Term Goal 1 (Week 2): STG=LTG secondary to LOS    Therapy Documentation Precautions:  Precautions Precautions: Fall Precaution Comments: hx of multiple falls due to Parkinsons, HOH Restrictions Weight Bearing Restrictions: No  See FIM for current functional status  Therapy/Group: Individual Therapy  Leroy Libman 10/20/2013, 3:08 PM   I agree with the following treatment note after reviewing documentation.   Jeri Modena OTR/L Pager: [phone] Office:  [phone] .

## 2013-10-20 NOTE — Progress Notes (Signed)
Physical Therapy Weekly Progress Note  Patient Details  Name: Joshua Glass MRN: [id number] Date of Birth: Aug 15, 1932  Today's Date: 10/20/2013 Time: 1500-1600 Time Calculation (min): 60 min  Patient has met 1 of 5 short term goals.  Pt remains at overall mod A level for functional transfers and ambulation and max A for bed mobility.    Patient continues to demonstrate the following deficits: decreased initiation, decreased attention, rigidity, muscle weakness, decreased functional endurance, decreased coordination and decreased motor planning, decreased sitting/standing balance, decreased postural control and decreased balance strategies and therefore will continue to benefit from skilled PT intervention to enhance overall performance with activity tolerance, balance, postural control, attention, awareness and coordination.  Patient not progressing toward long term goals.  See goal revision. Plan of care revisions: Pt downgraded to overall mod assist secondary to premorbid deficits, very poor initiation, and variable level of assist required as pt fatigues.   PT Short Term Goals Week 1:  PT Short Term Goal 1 (Week 1): Pt to perform bed mobility w/ min A, 50% of time PT Short Term Goal 1 - Progress (Week 1): Not met PT Short Term Goal 2 (Week 1): Pt to perform transfers w/ min A, 50% of time PT Short Term Goal 2 - Progress (Week 1): Partly met PT Short Term Goal 3 (Week 1): Pt to amb 100' w/ min A, 50% of time PT Short Term Goal 3 - Progress (Week 1): Partly met PT Short Term Goal 4 (Week 1): Pt to sustain sitting balance on various surfaces w/ close(S) PT Short Term Goal 4 - Progress (Week 1): Met PT Short Term Goal 5 (Week 1): Pt to sustain standing balance during functional tasks w/ min A, 50% of time PT Short Term Goal 5 - Progress (Week 1): Partly met Week 2:  PT Short Term Goal 1 (Week 2): = downgraded, unmet LTGs of overall mod A  Skilled Therapeutic Interventions/Progress Updates:    Pt received sitting in w/c with belt on ready for therapy with wife present. Session focused on functional transfers, gait, standing balance and endurance, initiation, and sustained attention. Pt performed initial stand pivot transfer w/c > mat with SBA-minA and no cues required for initiation. Throughout session as pt fatigued, required mod-max A for all functional transfers and increased verbal cues for sequencing and initiation. To challenge static/dynamic balance, pt performed standing LE marching using RW 2 x 8 reps, standing on foam with BUE support on RW, 1 UE supported, and no UE support 3 trials x 30-45 sec with min-max A secondary to posterior weightshift/lean. Pt requesting seated breaks between. Pt ambulated 80 ft using RW and overall modA, requiring max A with episode of freezing to return to mat to sit. In standing to facilitate anterior weightshift with dynamic balance, pt bending at knees/hips to pick up horseshoes from mat, return to stand while maintain standing balance, and reach upward/anteriorly to place horseshoe on basketball goal. As patient fatigued he required verbal/tactile cues to complete task safely and correctly secondary to pt reaching for goal before regaining balance with multiple LOB requiring max A. Pt returned to room in w/c and left semireclined in bed, with bed alarm on and wife and visitors present.   Therapy Documentation Precautions:  Precautions Precautions: Fall Precaution Comments: hx of multiple falls due to Parkinsons, HOH Restrictions Weight Bearing Restrictions: No Pain: Pain Assessment Pain Assessment: No/denies pain Locomotion : Ambulation Ambulation/Gait Assistance: 3: Mod assist;2: Max assist   See FIM for current  functional status  Therapy/Group: Individual Therapy  Laretta Alstrom 10/20/2013, 5:28 PM

## 2013-10-20 NOTE — Plan of Care (Signed)
Problem: RH Balance Goal: LTG Patient will maintain dynamic standing balance (PT) LTG: Patient will maintain dynamic standing balance with assistance during mobility activities (PT)  Goal downgraded secondary to premorbid deficits, poor initiation, and variable level of assist required secondary to fatigue.  Problem: RH Bed Mobility Goal: LTG Patient will perform bed mobility with assist (PT) LTG: Patient will perform bed mobility with assistance, with/without cues (PT).  Goal downgraded secondary to premorbid deficits, poor initiation, and variable level of assist required secondary to fatigue.  Problem: RH Bed to Chair Transfers Goal: LTG Patient will perform bed/chair transfers w/assist (PT) LTG: Patient will perform bed/chair transfers with assistance, with/without cues (PT).  Goal downgraded secondary to premorbid deficits, poor initiation, and variable level of assist required secondary to fatigue.  Problem: RH Car Transfers Goal: LTG Patient will perform car transfers with assist (PT) LTG: Patient will perform car transfers with assistance (PT).  Goal downgraded secondary to premorbid deficits, poor initiation, and variable level of assist required secondary to fatigue.   Problem: RH Furniture Transfers Goal: LTG Patient will perform furniture transfers w/assist (OT/PT LTG: Patient will perform furniture transfers with assistance (OT/PT).  Goal downgraded secondary to premorbid deficits, poor initiation, and variable level of assist required secondary to fatigue.   Problem: RH Wheelchair Mobility Goal: LTG Patient will propel w/c in controlled environment (PT) LTG: Patient will propel wheelchair in controlled environment, # of feet with assist (PT)  Goal downgraded secondary to premorbid deficits, poor initiation, and variable level of assist required secondary to fatigue/alertness.   Problem: RH Stairs Goal: LTG Patient will ambulate up and down stairs w/assist (PT) LTG:  Patient will ambulate up and down # of stairs with assistance (PT)  Goal downgraded secondary to premorbid deficits, poor initiation, and variable level of assist required secondary to fatigue.  Problem: RH Attention Goal: LTG Patient will demonstrate focused/sustained (PT) LTG: Patient will demonstrate focused/sustained/selective/alternating/divided attention during functional mobility in specific environment with assist for # of minutes (PT)  Goal downgraded secondary to premorbid deficits, poor initiation, and variable level of assist required secondary to fatigue.   Problem: RH Awareness Goal: LTG: Patient will demonstrate intellectual/emergent (PT) LTG: Patient will demonstrate intellectual/emergent/anticipatory awareness with assist during a mobility activity (PT)  Goal downgraded secondary to premorbid deficits, poor initiation, and variable level of assist required secondary to fatigue.  Problem: RH Ambulation Goal: LTG Patient will ambulate in controlled environment (PT) LTG: Patient will ambulate in a controlled environment, # of feet with assistance (PT).  Goal downgraded secondary to premorbid deficits, poor initiation, and variable level of assist required secondary to fatigue.  Goal: LTG Patient will ambulate in home environment (PT) LTG: Patient will ambulate in home environment, # of feet with assistance (PT).  Goal downgraded secondary to premorbid deficits, poor initiation, and variable level of assist required secondary to fatigue.

## 2013-10-20 NOTE — Progress Notes (Signed)
[place] PHYSICAL MEDICINE & REHABILITATION     PROGRESS NOTE    Subjective/Complaints: Had a pretty good night. Eating better. Denies pain.  A 12 point review of systems has been performed and if not noted above is otherwise negative.   Objective: Vital Signs: Blood pressure 192/76, pulse 57, temperature 97.6 F (36.4 C), temperature source Oral, resp. rate 18, height 5\' 10"  (1.778 m), weight 98.884 kg (218 lb), SpO2 96.00%. No results found.  Recent Labs  10/18/13 0803  WBC 9.4  HGB 13.0  HCT 39.0  PLT 214    Recent Labs  10/18/13 0803  NA 143  K 3.4*  CL 102  GLUCOSE 166*  BUN 9  CREATININE 0.91  CALCIUM 9.1   CBG (last 3)   Recent Labs  10/19/13 1642 10/19/13 2120 10/20/13 0729  GLUCAP 154* 146* 122*    Wt Readings from Last 3 Encounters:  10/20/13 98.884 kg (218 lb)  10/09/13 105 kg (231 lb 7.7 oz)  10/09/13 105 kg (231 lb 7.7 oz)    Physical Exam:  Eyes: EOM are normal.  Neck: Normal range of motion. Neck supple. No thyromegaly present.  Cardiovascular:  Cardiac rate controlled. Reg rhythm  Respiratory: Effort normal and breath sounds normal. No respiratory distress.  GI: Soft. Bowel sounds are normal. He exhibits no distension. Non-tender Neurological:  Quite alert. Delayed processing.  Flat facies. He was able to provide his age place as well as date of birth. He did follow simple commands. His speech was a bit dysarthric but intelligible. Slow to process. Rigidity in all 4 limbs. RUE is 3- to 3/5 deltoid, bicep, tricep, HI. LUE is 1+ deltoid/shoulder (RTC), 3-bicep, tricep, 3 hand.Marland Kitchen RLE is 2 to 2+/5 prox HF, KE, to 1-2 distally at ankle with ADF/APF---inconsistent (but better) initiating movement. LLE 3- HF, 3 KE, ankle. Senses pain throughout on right.  Skin:  Craniotomy site clean, staples out Psych:   Affect more dynamic. occasionallly jokes and kids around   Assessment/Plan: 1. Functional deficits secondary to left SDH after fall  which require 3+ hours per day of interdisciplinary therapy in a comprehensive inpatient rehab setting. Physiatrist is providing close team supervision and 24 hour management of active medical problems listed below. Physiatrist and rehab team continue to assess barriers to discharge/monitor patient progress toward functional and medical goals.  Spoke with wife regarding potential care needs at home. She has concerns herself- will work through issues this week with team and try to come to a decision as to whether she can manage or not.  FIM: FIM - Bathing Bathing Steps Patient Completed: Chest;Right Arm;Left Arm;Front perineal area;Right upper leg;Left upper leg Bathing: 3: Mod-Patient completes 5-7 32f 10 parts or 50-74%  FIM - Upper Body Dressing/Undressing Upper body dressing/undressing steps patient completed: Thread/unthread right sleeve of pullover shirt/dresss;Thread/unthread left sleeve of pullover shirt/dress Upper body dressing/undressing: 3: Mod-Patient completed 50-74% of tasks FIM - Lower Body Dressing/Undressing Lower body dressing/undressing steps patient completed: Thread/unthread left pants leg Lower body dressing/undressing: 1: Total-Patient completed less than 25% of tasks  FIM - Toileting Toileting: 0: Activity did not occur  FIM - Air cabin crew Transfers: 0-Activity did not occur  FIM - Control and instrumentation engineer Devices: Arm rests;Walker Bed/Chair Transfer: 3: Bed > Chair or W/C: Mod A (lift or lower assist);3: Chair or W/C > Bed: Mod A (lift or lower assist)  FIM - Locomotion: Wheelchair Distance: 25' Locomotion: Wheelchair: 1: Total Assistance/staff pushes wheelchair (Pt<25%) FIM - Locomotion:  Ambulation Locomotion: Ambulation Assistive Devices: Administrator Ambulation/Gait Assistance: 2: Max assist;3: Mod assist Locomotion: Ambulation: 1: Travels less than 50 ft with maximal assistance (Pt: 25 -  49%)  Comprehension Comprehension Mode: Auditory Comprehension: 4-Understands basic 75 - 89% of the time/requires cueing 10 - 24% of the time  Expression Expression Mode: Verbal Expression: 2-Expresses basic 25 - 49% of the time/requires cueing 50 - 75% of the time. Uses single words/gestures.  Social Interaction Social Interaction: 2-Interacts appropriately 25 - 49% of time - Needs frequent redirection.  Problem Solving Problem Solving: 2-Solves basic 25 - 49% of the time - needs direction more than half the time to initiate, plan or complete simple activities  Memory Memory: 2-Recognizes or recalls 25 - 49% of the time/requires cueing 51 - 75% of the time  Medical Problem List and Plan:  1. Left subdural hematoma after a fall. Status post craniotomy evacuation of hematoma 10/07/2013  2. DVT Prophylaxis/Anticoagulation: SCDs. Monitor for any signs of DVT  3. Pain Management: Ultram as needed. Monitor with increased mobility  4. Neuropsych: This patient is not capable of making decisions on his own behalf  5.Dysphagia.Dysphgia 3 nectar liquids.Follow up speech therapy.  6. Seizure prophylaxis. Keppra 500 mg twice a day.  7. Parkinson's disease. Continue Sinemet as directed.  .8 Diabetes mellitus with peripheral neuropathy. Latest hemoglobin A1c 8.3. Check blood sugars a.c. and at bedtime. Continue tradjenta 5 mg daily.   -improving control 9. Hypertension. Norvasc 10 mg daily, Lasix 20 mg daily. Monitor with increased mobility  10. Hyperlipidemia. Zocor  11. History of atrial fibrillation. No anticoagulants prior to admission. Cardiac rate controlled   -continue to replace potassium 12. Asthma. Singulair 10 mg each bedtime. No shortness of breath reported  13. BPH. Flomax 0.4 mg daily. 14. Hypothyroidism. Synthroid  15. Obstructive sleep apnea. Nasal cpap 16. Leukocytosis---recheck next week--no signs of infection at present--trending down 17. Dysphagia: mulitfactorial D3 thins  per SLP  -appetite seems to be picking up  -advised wife to encourage him and observe  - glucerna tid and hs  -added megace  -full supervision for now    LOS (Days) 7 A FACE TO FACE EVALUATION WAS PERFORMED  SWARTZ,ZACHARY T 10/20/2013 9:11 AM

## 2013-10-20 NOTE — Progress Notes (Signed)
Physical Therapy Session Note  Patient Details  Name: Joshua Glass MRN: [id number] Date of Birth: 10-Oct-1932  Today's Date: 10/20/2013 Time: 8938-1017 Time Calculation (min): 55 min  Short Term Goals: Week 1:  PT Short Term Goal 1 (Week 1): Pt to perform bed mobility w/ min A, 50% of time PT Short Term Goal 2 (Week 1): Pt to perform transfers w/ min A, 50% of time PT Short Term Goal 3 (Week 1): Pt to amb 100' w/ min A, 50% of time PT Short Term Goal 4 (Week 1): Pt to sustain sitting balance on various surfaces w/ close(S) PT Short Term Goal 5 (Week 1): Pt to sustain standing balance during functional tasks w/ min A, 50% of time  Skilled Therapeutic Interventions/Progress Updates:  1:1. Pt received sitting in w/c, ready for therapy. Discussion w/ pt and wife regarding d/c planning. Pt's wife with questions regarding SNF environment vs. 24hr home care. Educated provided in relation to follow up therapies in both environments, additional questions deferred to SW. Pt participated in two rounds of card game standing at elevated table to target sustained attention, initiation, standing endurance and standing balance. Pt req seated rest between bouts, req min- occasional mod A for standing balance overall. Pt consistently initiating management of appropriate cards. Therapist holding cards for second round due to req increased reliance on B UE for support. Use of NuStep x8' at end of session to target gross NMR for large reciprocal movements. Pt demonstrating improved initiation t/f sit<>stand this session able to complete 75% of tranfers w/ close(S)-min A, but mod A for remaining 25%. Mod A for ambulation x45' w/ RW. Verbal cues to stay in the RW due to progressive trunk flexion. Pt sitting in w/c at end of session w/ all needs in reach.   Therapy Documentation Precautions:  Precautions Precautions: Fall Precaution Comments: hx of multiple falls due to Parkinsons, HOH Restrictions Weight Bearing  Restrictions: No Vital Signs:   Pain: Pain Assessment Pain Assessment: No/denies pain  See FIM for current functional status  Therapy/Group: Individual Therapy  [name] 10/20/2013, 12:14 PM

## 2013-10-20 NOTE — Progress Notes (Signed)
Social Work Patient ID: Joshua Glass, male   DOB: [birth year], 80 y.o.   MRN: [id number]  Met with pt and wife today to review team conference info.  PT had already begun discussion yesterday with wife about concerns of pt's care needs and possibility of pursuing SNF if wife cannot meet needs.  Wife reports understanding that he will require more assistance than she can physically provide.  She is open to consideration of SNF, however, they do have a long term care policy and she is also considering using this to arrange private duty.  Wife to bring in policy information tomorrow and we will review and discuss to determine best plan option.  Continue to follow.   Kemper Hochman, LCSW

## 2013-10-21 ENCOUNTER — Inpatient Hospital Stay (HOSPITAL_COMMUNITY): Payer: Medicare Other | Admitting: Speech Pathology

## 2013-10-21 ENCOUNTER — Encounter (HOSPITAL_COMMUNITY): Payer: Medicare Other

## 2013-10-21 ENCOUNTER — Inpatient Hospital Stay (HOSPITAL_COMMUNITY): Payer: Medicare Other

## 2013-10-21 DIAGNOSIS — S065X9A Traumatic subdural hemorrhage with loss of consciousness of unspecified duration, initial encounter: Secondary | ICD-10-CM

## 2013-10-21 DIAGNOSIS — G40919 Epilepsy, unspecified, intractable, without status epilepticus: Secondary | ICD-10-CM

## 2013-10-21 DIAGNOSIS — S065XAA Traumatic subdural hemorrhage with loss of consciousness status unknown, initial encounter: Secondary | ICD-10-CM

## 2013-10-21 DIAGNOSIS — R1310 Dysphagia, unspecified: Secondary | ICD-10-CM

## 2013-10-21 DIAGNOSIS — G20 Parkinson's disease: Secondary | ICD-10-CM

## 2013-10-21 DIAGNOSIS — E1149 Type 2 diabetes mellitus with other diabetic neurological complication: Secondary | ICD-10-CM

## 2013-10-21 DIAGNOSIS — E1142 Type 2 diabetes mellitus with diabetic polyneuropathy: Secondary | ICD-10-CM

## 2013-10-21 DIAGNOSIS — I10 Essential (primary) hypertension: Secondary | ICD-10-CM

## 2013-10-21 LAB — GLUCOSE, CAPILLARY
GLUCOSE-CAPILLARY: 141 mg/dL — AB (ref 70–99)
Glucose-Capillary: 117 mg/dL — ABNORMAL HIGH (ref 70–99)
Glucose-Capillary: 149 mg/dL — ABNORMAL HIGH (ref 70–99)
Glucose-Capillary: 161 mg/dL — ABNORMAL HIGH (ref 70–99)

## 2013-10-21 NOTE — Progress Notes (Signed)
[place] PHYSICAL MEDICINE & REHABILITATION     PROGRESS NOTE    Subjective/Complaints: No c/o except tired after PT today A 12 point review of systems has been performed and if not noted above is otherwise negative.   Objective: Vital Signs: Blood pressure 168/70, pulse 78, temperature 97.8 F (36.6 C), temperature source Oral, resp. rate 19, height 5\' 10"  (1.778 m), weight 98.884 kg (218 lb), SpO2 94.00%. No results found. No results found for this basename: WBC, HGB, HCT, PLT,  in the last 72 hours No results found for this basename: NA, K, CL, CO, GLUCOSE, BUN, CREATININE, CALCIUM,  in the last 72 hours CBG (last 3)   Recent Labs  10/20/13 2052 10/21/13 0729 10/21/13 1156  GLUCAP 148* 117* 149*    Wt Readings from Last 3 Encounters:  10/20/13 98.884 kg (218 lb)  10/09/13 105 kg (231 lb 7.7 oz)  10/09/13 105 kg (231 lb 7.7 oz)    Physical Exam:  Eyes: EOM are normal.  Neck: Normal range of motion. Neck supple. No thyromegaly present.  Cardiovascular:  Cardiac rate controlled. Reg rhythm  Respiratory: Effort normal and breath sounds normal. No respiratory distress.  GI: Soft. Bowel sounds are normal. He exhibits no distension. Non-tender Neurological:  Quite alert. Delayed processing.  Flat facies. He was able to provide his age place as well as date of birth. Not oriented to today's dateHe did follow simple commands. His speech was a bit dysarthric but intelligible. Slow to process. Rigidity in all 4 limbs. RUE is 3- to 3/5 deltoid, bicep, tricep, HI. LUE is 1+ deltoid/shoulder (RTC), 3-bicep, tricep, 3 hand.Marland Kitchen RLE is 2 to 2+/5 prox HF, KE, to 1-2 distally at ankle with ADF/APF---inconsistent (but better) initiating movement. LLE 3- HF, 3 KE, ankle. Senses pain throughout on right.  Skin:  Craniotomy site clean, staples out Psych:   Affect more dynamic. occasionallly jokes and kids around   Assessment/Plan: 1. Functional deficits secondary to left SDH after fall  which require 3+ hours per day of interdisciplinary therapy in a comprehensive inpatient rehab setting. Physiatrist is providing close team supervision and 24 hour management of active medical problems listed below. Physiatrist and rehab team continue to assess barriers to discharge/monitor patient progress toward functional and medical goals.    FIM: FIM - Bathing Bathing Steps Patient Completed: Chest;Right Arm;Left Arm;Abdomen;Front perineal area;Right upper leg;Left upper leg Bathing: 3: Mod-Patient completes 5-7 6f 10 parts or 50-74%  FIM - Upper Body Dressing/Undressing Upper body dressing/undressing steps patient completed: Thread/unthread left sleeve of pullover shirt/dress;Pull shirt over trunk Upper body dressing/undressing: 3: Mod-Patient completed 50-74% of tasks FIM - Lower Body Dressing/Undressing Lower body dressing/undressing steps patient completed: Thread/unthread left pants leg Lower body dressing/undressing: 1: Total-Patient completed less than 25% of tasks  FIM - Toileting Toileting: 0: Activity did not occur  FIM - Air cabin crew Transfers: 0-Activity did not occur  FIM - Control and instrumentation engineer Devices: Environmental consultant;Arm rests Bed/Chair Transfer: 3: Supine > Sit: Mod A (lifting assist/Pt. 50-74%/lift 2 legs;3: Sit > Supine: Mod A (lifting assist/Pt. 50-74%/lift 2 legs);4: Bed > Chair or W/C: Min A (steadying Pt. > 75%);4: Chair or W/C > Bed: Min A (steadying Pt. > 75%)  FIM - Locomotion: Wheelchair Distance: 25' Locomotion: Wheelchair: 2: Travels 50 - 149 ft with maximal assistance (Pt: 25 - 49%) FIM - Locomotion: Ambulation Locomotion: Ambulation Assistive Devices: Administrator Ambulation/Gait Assistance: 3: Mod assist Locomotion: Ambulation: 2: Travels 50 - 149 ft with  moderate assistance (Pt: 50 - 74%)  Comprehension Comprehension Mode: Auditory Comprehension: 5-Follows basic conversation/direction: With extra  time/assistive device  Expression Expression Mode: Verbal Expression: 3-Expresses basic 50 - 74% of the time/requires cueing 25 - 50% of the time. Needs to repeat parts of sentences.  Social Interaction Social Interaction: 4-Interacts appropriately 75 - 89% of the time - Needs redirection for appropriate language or to initiate interaction.  Problem Solving Problem Solving: 3-Solves basic 50 - 74% of the time/requires cueing 25 - 49% of the time  Memory Memory: 2-Recognizes or recalls 25 - 49% of the time/requires cueing 51 - 75% of the time  Medical Problem List and Plan:  1. Left subdural hematoma after a fall. Status post craniotomy evacuation of hematoma 10/07/2013  2. DVT Prophylaxis/Anticoagulation: SCDs. Monitor for any signs of DVT  3. Pain Management: Ultram as needed. Monitor with increased mobility  4. Neuropsych: This patient is not capable of making decisions on his own behalf  5.Dysphagia.Dysphgia 3 nectar liquids.Follow up speech therapy.  6. Seizure prophylaxis. Keppra 500 mg twice a day.  7. Parkinson's disease. Continue Sinemet as directed.  .8 Diabetes mellitus with peripheral neuropathy. Latest hemoglobin A1c 8.3. Check blood sugars a.c. and at bedtime. Continue tradjenta 5 mg daily.   -improving control 9. Hypertension. Norvasc 10 mg daily, Lasix 20 mg daily. Monitor with increased mobility  10. Hyperlipidemia. Zocor  11. History of atrial fibrillation. No anticoagulants prior to admission. Cardiac rate controlled   -continue to replace potassium 12. Asthma. Singulair 10 mg each bedtime. No shortness of breath reported  13. BPH. Flomax 0.4 mg daily. 14. Hypothyroidism. Synthroid  15. Obstructive sleep apnea. Nasal cpap 16. Leukocytosis---recheck next week--no signs of infection at present--trending down 17. Dysphagia: mulitfactorial D3 thins per SLP  -appetite seems to be picking up  -advised wife to encourage him and observe  - glucerna tid and hs  -added  megace  -full supervision for now    LOS (Days) [place] E 10/21/2013 4:33 PM

## 2013-10-21 NOTE — Progress Notes (Addendum)
Patient is resting comfortably on CPAP machine via home mask (nasal pillows) auto titrate settings.  Wife at bedside.

## 2013-10-21 NOTE — Progress Notes (Signed)
Nutrition Brief Note  Patient identified on the Malnutrition Screening Tool (MST) Report  Wt Readings from Last 15 Encounters:  10/20/13 218 lb (98.884 kg)  10/09/13 231 lb 7.7 oz (105 kg)  10/09/13 231 lb 7.7 oz (105 kg)  09/23/13 240 lb (108.863 kg)  07/27/13 249 lb (112.946 kg)  05/18/13 245 lb (111.131 kg)  04/22/13 246 lb (111.585 kg)  04/20/13 237 lb (107.502 kg)  03/31/13 243 lb 9.6 oz (110.496 kg)  03/30/12 251 lb (113.853 kg)  01/02/12 247 lb (112.038 kg)  10/07/11 237 lb (107.502 kg)  03/29/11 255 lb 9.6 oz (115.939 kg)  03/30/10 251 lb 2.1 oz (113.912 kg)  03/30/09 251 lb 8 oz (114.08 kg)    Body mass index is 31.28 kg/(m^2). Patient meets criteria for obese based on current BMI.   Current diet order is Dysphagia 3 thin liquid, patient is consuming approximately 30-100% of meals at this time. Labs and medications reviewed.   Intake is variable, however overall sufficient.  Per family member at bedside, pt has been eating well.  He did have poor intake x1 week per family and is now on appetite stimulants to help with intake. Per chart review, pt lost 22 lbs in 1 week (using weight from 4/1) which is not likely attributed to poor intake x1 week. No nutrition interventions warranted at this time as pt is eating well. If nutrition issues arise, please consult RD.   Brynda Greathouse, MS RD LDN Clinical Inpatient Dietitian Pager: [phone] Weekend/After hours pager: [phone]

## 2013-10-21 NOTE — Progress Notes (Addendum)
Physical Therapy Session Note  Patient Details  Name: Joshua Glass MRN: [id number] Date of Birth: [birth year]  Today's Date: 10/21/2013 Time: 1245-8099 Time Calculation (min): 45 min  Short Term Goals: Week 2:  PT Short Term Goal 1 (Week 2): = downgraded, unmet LTGs of overall mod A  Skilled Therapeutic Interventions/Progress Updates:  Treatment Session 1:  1:1. Pt received sitting in w/c, ready for therapy. Pt propelled w/c w/ B LE x100' to promote reciprocal pattern, however, req max cues to sustain attention to task in busy hallway environment. Transition to ambulation for remaining distance to therapy gym x50' w/ mod A overall to due progressive anterior lean. Emphasis majority of session on trunk extension/rotation as well as dynamic standing balance w/out B UE support. Pt reaching to upper right to grasp horseshoes and transitioning to toss at target on L side w/ therapist manually facilitating trunk, providing min-mod A for stability. Pt able to perform all transfers w/ close(S)-min A, benefits from counting, "1, 2, 3..." to initiate transfer. Pt's wife consistently answer questions for pt throughout session as well as stating, "oh now you're [pt] just being silly come on" due to delayed verbal and physical responses. Education provided regarding increased time for processing due to pt's diagnosis. Pt sitting in w/c at end of session w/ all needs in reach, quick release belt in place and wife in room.   Treatment Session 2: Pt missed 88min of skilled physical therapy this session due to fatigue. Pt falling asleep in chair, req mod A for assist back to bed. Will continue per current POC.   Therapy Documentation Precautions:  Precautions Precautions: Fall Precaution Comments: hx of multiple falls due to Parkinsons, HOH Restrictions Weight Bearing Restrictions: No General: Amount of Missed PT Time (min): 30 Minutes Missed Time Reason: Patient fatigue  See FIM for current functional  status  Therapy/Group: Individual Therapy  [name] 10/21/2013, 3:16 PM

## 2013-10-21 NOTE — Progress Notes (Signed)
Occupational Therapy Session Note  Patient Details  Name: Joshua Glass MRN: [id number] Date of Birth: March 28, 1933  Today's Date: 10/21/2013  Session 1 Time: 0900-1013 Time Calculation (min): 73 min  Short Term Goals: Week 2:  OT Short Term Goal 1 (Week 2): STG=LTG secondary to LOS   Skilled Therapeutic Interventions/Progress Updates:    Pt engaged in ADL retraining including bathing at shower level and dressing with sit<>stand from w/c at sink.  Pt incontinent of bowel while in shower with no awareness.  Pt required total a for cleanliness.  Pt issued long handle sponge and was able to bathe BLE including feet during shower.  Pt continues to require extra time and mod verbal cues to initiate tasks.  Pt becomes distracted during task completion and requires mod verbal cues for redirection.  Focus on activity tolerance, task initiation, attention to task, sit<>stand, standing balance, and safety awareness.   Therapy Documentation Precautions:  Precautions Precautions: Fall Precaution Comments: hx of multiple falls due to Parkinsons, HOH Restrictions Weight Bearing Restrictions: No  See FIM for current functional status  Therapy/Group: Individual Therapy  Session 2 Time: 1100-1154 Pt denies pain Individual Therapy  Pt engaged in dynamic standing activities at hi-lo table to assemble PVC pipe structures using BUE.  Pt stood 5 mins X 3 with rest breaks before transitioning to standing to toss bean bags at target.  Pt stood 5 mins X 2.  Pt performed sit<>stand with supervision and extra time.  Pt transitioned to doffing/donning bilateral socks and shoes.  Pt able to corss legs and initiate donning socks and shoes but continues to require assistance to complete task.   Leotis Shames [facility] 10/21/2013, 10:15 AM

## 2013-10-21 NOTE — Progress Notes (Signed)
Speech Language Pathology Weekly Progress and Session Note  Patient Details  Name: Joshua Glass MRN: [id number] Date of Birth: 12/05/1932  Today's Date: 10/21/2013 Time: 1015-1100 Time Calculation (min): 45 min  Short Term Goals: Week 1: SLP Short Term Goal 1 (Week 1): Patient will utilize external aids to assist in recall of new and daily information with Mod multimodal cues. SLP Short Term Goal 1 - Progress (Week 1): Not met SLP Short Term Goal 2 (Week 1): Patient will attend to functional task for 5 minutes with Mod verbal cues. SLP Short Term Goal 2 - Progress (Week 1): Met SLP Short Term Goal 3 (Week 1): Patient will complete basic problem solving activities with Mod multimodal cues.  SLP Short Term Goal 3 - Progress (Week 1): Met SLP Short Term Goal 4 (Week 1): Patient will consume current diet without overt s/s of aspiration with Mod verbal cues to utilize safe swallow strategies. SLP Short Term Goal 4 - Progress (Week 1): Met SLP Short Term Goal 5 (Week 1): Patient will initiate verbal expression with Mod multimodal cues.  SLP Short Term Goal 5 - Progress (Week 1): Met SLP Short Term Goal 6 (Week 1): Patient will utilize speech intelligibility strategies with Mod multimodal cues at the word level. SLP Short Term Goal 6 - Progress (Week 1): Met    New Short Term Goals: Week 2: SLP Short Term Goal 1 (Week 2): Patient will utilize speech intelligibility strategies with Min multimodal cues at the word level. SLP Short Term Goal 2 (Week 2): Patient will initiate verbal expression with Min multimodal cues.  SLP Short Term Goal 3 (Week 2): Patient will complete basic problem solving activities with Min multimodal cues.  SLP Short Term Goal 4 (Week 2): Patient will attend to functional task for 15 minutes with Mod verbal cues. SLP Short Term Goal 5 (Week 2): Patient will utilize external aids to assist in recall of new and daily information with Mod multimodal cues. SLP Short Term Goal 6  (Week 2): Patient will consume current diet without overt s/s of aspiration with Min verbal cues to utilize safe swallow strategies.  Weekly Progress Updates: Pt has made functional gains and has met 5 of 6 STG's this reporting period due to increased initiation with verbal expression and functional tasks, problem solving, attention, speech intelligibility and utilization of swallowing compensatory strategies. Currently, pt is consuming Dys. 3 textures with thin liquids with minimal overt s/s of aspiration with Mod A verbal cues needed for utilization of swallowing compensatory strategies. Pt requires Mod A for increased vocal intensity and over articulation at the phrase level to increase overall speech intelligibility. Pt also requires Mod A for initiation, problem solving, attention and awareness and Max A for recall of new information. Pt/family education ongoing. Pt would benefit from continued skilled SLP intervention to maximize cognitive and swallowing function and functional communication to increase patient's overall functional independence.    Intensity: Minumum of 1-2 x/day, 30 to 90 minutes Frequency: 5 out of 7 days Duration/Length of Stay: TBD Treatment/Interventions: Cognitive remediation/compensation;Environmental controls;Internal/external aids;Oral motor exercises;Therapeutic Exercise;Cueing hierarchy;Dysphagia/aspiration precaution training;Functional tasks;Patient/family education;Therapeutic Activities   Daily Session  Skilled Therapeutic Interventions: Skilled treatment session focused on cognitive goals in regards to initiation and attention. SLP facilitated session by providing Mod A verbal cues to increase vocal intensity and over articultion to increase speech intelligibility at the phrase level.  Pt also required Mod-Max question cues for verbal initiation during a basic verbal description task. Pt reported,"I know  what I want to say, I just don't know how." Patient and his  wife educated on current cognitive-linguistic goals and strategies to increase word-finding. Patient's wife requires continued education. Continue with current plan of care.  FIM:  Comprehension Comprehension Mode: Auditory Comprehension: 5-Follows basic conversation/direction: With extra time/assistive device Expression Expression: 3-Expresses basic 50 - 74% of the time/requires cueing 25 - 50% of the time. Needs to repeat parts of sentences. Social Interaction Social Interaction: 4-Interacts appropriately 75 - 89% of the time - Needs redirection for appropriate language or to initiate interaction. Problem Solving Problem Solving: 3-Solves basic 50 - 74% of the time/requires cueing 25 - 49% of the time Memory Memory: 2-Recognizes or recalls 25 - 49% of the time/requires cueing 51 - 75% of the time Pain No/Denies Pain  Therapy/Group: Individual Therapy  Pernell Lenoir 10/21/2013, 3:41 PM

## 2013-10-22 ENCOUNTER — Inpatient Hospital Stay (HOSPITAL_COMMUNITY): Payer: Medicare Other

## 2013-10-22 ENCOUNTER — Inpatient Hospital Stay (HOSPITAL_COMMUNITY): Payer: Medicare Other | Admitting: Speech Pathology

## 2013-10-22 ENCOUNTER — Encounter (HOSPITAL_COMMUNITY): Payer: Medicare Other

## 2013-10-22 LAB — GLUCOSE, CAPILLARY
GLUCOSE-CAPILLARY: 103 mg/dL — AB (ref 70–99)
GLUCOSE-CAPILLARY: 150 mg/dL — AB (ref 70–99)
GLUCOSE-CAPILLARY: 153 mg/dL — AB (ref 70–99)
Glucose-Capillary: 160 mg/dL — ABNORMAL HIGH (ref 70–99)

## 2013-10-22 NOTE — Progress Notes (Signed)
Patient's family member stated that she put patient on CPAP.  Tech entered room and the CPAP had to be removed.  RT checked the water for the patient's wife and she stated that once they finished working on him that she would put it back on the patient.  RT will continue to monitor.

## 2013-10-22 NOTE — Progress Notes (Addendum)
Occupational Therapy Session Note  Patient Details  Name: Joshua Glass MRN: [id number] Date of Birth: 03/09/1933  Today's Date: 10/22/2013  Session 1 Time: 0900-1010 Time Calculation (min): 70 min  Short Term Goals: Week 2:  OT Short Term Goal 1 (Week 2): STG=LTG secondary to LOS   Skilled Therapeutic Interventions/Progress Updates:    Pt resting in bed upon arrival and agreeable to bathing at shower level and dressing with sit<>stand from w/c.  Pt required min A with min verbal cues to sit EOB in preparation for transfer to w/c placed in front of sink.  Pt required extra time to initiate tasks but was able to complete short amb with RW (5') with steady A.  Pt required min verbal cues for safety during stand pivot transfer to shower seat.  Pt required min verbal cues for sequencing and thoroughness during shower.  Pt initiated using long handle sponge to bathe feet.  Pt continues to require min verbal cues for safety awareness with transfers, sit<>stand, and functional amb with RW.  Educated wife in reference to allowing patient time to initiate and complete tasks without assistance.  Pt's wife stated that she tries but she gets so impatient.  Pt educated on use of reacher to assist with threading pants.  Pt/wife stated they will purchase at discharge.  Focus on activity tolerance, task initiation, sequencing, sit<>stand, standing balance, transfers, and safety awareness.  Therapy Documentation Precautions:  Precautions Precautions: Fall Precaution Comments: hx of multiple falls due to Parkinsons, HOH Restrictions Weight Bearing Restrictions: No  See FIM for current functional status  Therapy/Group: Individual Therapy  Session 2 Time: 1300-1400 Pt denies pain Individual Therapy  Pt engaged in dynamic standing tasks, functional amb with RW, simulated walk-in shower transfers, and family education. Pt's wife present during session to observe.  Pt engaged in tossing horsehoes and  retrieving from floor using reacher.  Focus on dynamic standing balance and safety awareness.  Pt practiced stepping over 4" ledge into simulated shower stall requiring min A.  Pt amb with RW approx distance that will be necessary at home to acces bathroom and living room.  Pt continues to exhibit increased rate at times with inability to control RW.  Pt requires min A for functional amb with RW.  Focus on activity tolerance, safety awareness, dynamic standing balance, functional ambulation with RW, family education.  Leotis Shames [facility] 10/22/2013, 10:15 AM

## 2013-10-22 NOTE — Progress Notes (Signed)
The skilled treatment note has been reviewed and SLP is in agreement.  Hazle Ogburn, M.A., CCC-SLP  [phone]   

## 2013-10-22 NOTE — Progress Notes (Signed)
Physical Therapy Session Note  Patient Details  Name: Joshua Glass MRN: [id number] Date of Birth: 06-13-1933  Today's Date: 10/22/2013 Time: Treatment Session 1: 1100-1155; Treatment Session 2: 1430-1500 Time Calculation (min): Treatment Session 1: 58min ; Treatment Session 2: 30 min  Short Term Goals: Week 2:  PT Short Term Goal 1 (Week 2): = downgraded, unmet LTGs of overall mod A  Skilled Therapeutic Interventions/Progress Updates:  1:1. Pt received sitting in w/c, ready for therapy. Focus this session on gait training, B LE NMR and education. Use of eva walker during amb 75'x2 to promote overall extension, w/ min-mod A for steering. Pt req consistent cueing ("left, right, left, ect." as well as use of metronome at 45bpm) to achieve and maintain more fluid reciprocal pattern. Use of NuStep x8' to further target large reciprocal movements. Pt practiced multiple t/f sit<>stand from various surfaces req supervision w/ assist from tactile or verbal cues during episodes of freezing. Continued education w/ pt and wife regarding strengths/weakness of d/c home vs. 24/7care. Pt's wife reports that SW to follow up with them soon regarding d/c planning. Pt sitting in w/c at end of session w/ all needs in reach, quick release belt in place and wife in room.   Treatment Session 2:  1:1. Pt received sitting in w/c, ready for therapy. Focus this session on SPT w/c<>furniture as well as sit<>stand from lower height surfaces to target initiation and consistency. Pt req supervision to occasional min A for transfers w/ use of verbal ("1, 2, 3...") and tactile cues for anterior weight shift for assistance to initiate. Pt sitting in w/c at end of session w/ all needs in reach, quick release belt in place and wife in room.   Therapy Documentation Precautions:  Precautions Precautions: Fall Precaution Comments: hx of multiple falls due to Parkinsons, HOH Restrictions Weight Bearing Restrictions: No  Pain: Pain  Assessment Pain Assessment: No/denies pain  See FIM for current functional status  Therapy/Group: Individual Therapy  [name] 10/22/2013, 12:37 PM

## 2013-10-22 NOTE — Progress Notes (Signed)
Speech Language Pathology Daily Session Note  Patient Details  Name: Joshua Glass MRN: [id number] Date of Birth: Dec 24, 1932  Today's Date: 10/22/2013 Time: 1015-1100 Time Calculation (min): 45 min  Short Term Goals: Week 2: SLP Short Term Goal 1 (Week 2): Patient will utilize speech intelligibility strategies with Min multimodal cues at the word level. SLP Short Term Goal 2 (Week 2): Patient will initiate verbal expression with Min multimodal cues.  SLP Short Term Goal 3 (Week 2): Patient will complete basic problem solving activities with Min multimodal cues.  SLP Short Term Goal 4 (Week 2): Patient will attend to functional task for 15 minutes with Mod verbal cues. SLP Short Term Goal 5 (Week 2): Patient will utilize external aids to assist in recall of new and daily information with Mod multimodal cues. SLP Short Term Goal 6 (Week 2): Patient will consume current diet without overt s/s of aspiration with Min verbal cues to utilize safe swallow strategies.  Skilled Therapeutic Interventions: Skilled treatment session adressed cogntive goals. Patient required Min multimodal cues for verbal initiation and speech intelligibility strategies to complete a basic naming task. Patient also requried Mod multimodal cues to name objects within a category to complete task. Patient with increased coughing without PO intake session and required frequent cued throat clears. Continue with current plan of care.    FIM:  Comprehension Comprehension Mode: Auditory Comprehension: 5-Follows basic conversation/direction: With extra time/assistive device Expression Expression Mode: Verbal Expression: 2-Expresses basic 25 - 49% of the time/requires cueing 50 - 75% of the time. Uses single words/gestures. Social Interaction Social Interaction: 4-Interacts appropriately 75 - 89% of the time - Needs redirection for appropriate language or to initiate interaction. Problem Solving Problem Solving: 2-Solves basic  25 - 49% of the time - needs direction more than half the time to initiate, plan or complete simple activities Memory Memory: 2-Recognizes or recalls 25 - 49% of the time/requires cueing 51 - 75% of the time FIM - Eating Eating Activity: 0: Activity did not occur  Pain Pain Assessment Pain Assessment: No/denies pain  Therapy/Group: Individual Therapy  Suraya Vidrine 10/22/2013, 11:44 AM

## 2013-10-22 NOTE — Progress Notes (Signed)
Patient is wearing CPAP with home mask, auto titrate settings.  Sterile H20 added to unit for humidity. Patient tolerating well at this time.  Family member at bedside.

## 2013-10-22 NOTE — Progress Notes (Signed)
[facility]     PROGRESS NOTE    Subjective/Complaints: Slept ok, remembers me from yesterday A 12 point review of systems has been performed and if not noted above is otherwise negative.   Objective: Vital Signs: Blood pressure 171/73, pulse 62, temperature 97.5 F (36.4 C), temperature source Oral, resp. rate 18, height 5\' 10"  (1.778 m), weight 98.884 kg (218 lb), SpO2 96.00%. No results found. No results found for this basename: WBC, HGB, HCT, PLT,  in the last 72 hours No results found for this basename: NA, K, CL, CO, GLUCOSE, BUN, CREATININE, CALCIUM,  in the last 72 hours CBG (last 3)   Recent Labs  10/21/13 1642 10/21/13 2131 10/22/13 0717  GLUCAP 161* 141* 103*    Wt Readings from Last 3 Encounters:  10/20/13 98.884 kg (218 lb)  10/09/13 105 kg (231 lb 7.7 oz)  10/09/13 105 kg (231 lb 7.7 oz)    Physical Exam:  Eyes: EOM are normal.  Neck: Normal range of motion. Neck supple. No thyromegaly present.  Cardiovascular:  Cardiac rate controlled. Reg rhythm  Respiratory: Effort normal and breath sounds normal. No respiratory distress.  GI: Soft. Bowel sounds are normal. He exhibits no distension. Non-tender Neurological:  Quite alert. Delayed processing.  Flat facies. He was able to provide his age place as well as date of birth.  His speech was a bit dysarthric but intelligible. Slow to process. Rigidity in all 4 limbs. RUE is 3- to 3/5 deltoid, bicep, tricep, HI. LUE is 1+ deltoid/shoulder (RTC), 3-bicep, tricep, 3 hand.Marland Kitchen RLE is3-/5 prox HF, KE, to 2-distally at ankle with ADF/APF---inconsistent (but better) initiating movement. LLE 3- HF, 3 KE, ankle. Senses pain throughout on right.  Skin:  Craniotomy site clean, staples out Psych:   Affect more dynamic. occasionallly jokes and kids around   Assessment/Plan: 1. Functional deficits secondary to left SDH after fall which require 3+ hours per day of interdisciplinary therapy in a  comprehensive inpatient rehab setting. Physiatrist is providing close team supervision and 24 hour management of active medical problems listed below. Physiatrist and rehab team continue to assess barriers to discharge/monitor patient progress toward functional and medical goals.    FIM: FIM - Bathing Bathing Steps Patient Completed: Chest;Abdomen;Right upper leg;Left upper leg;Right lower leg (including foot);Left lower leg (including foot);Right Arm Bathing: 3: Mod-Patient completes 5-7 42f 10 parts or 50-74%  FIM - Upper Body Dressing/Undressing Upper body dressing/undressing steps patient completed: Thread/unthread right sleeve of pullover shirt/dresss;Put head through opening of pull over shirt/dress;Pull shirt over trunk Upper body dressing/undressing: 4: Min-Patient completed 75 plus % of tasks FIM - Lower Body Dressing/Undressing Lower body dressing/undressing steps patient completed: Thread/unthread left pants leg;Don/Doff right sock;Thread/unthread right pants leg;Don/Doff left shoe Lower body dressing/undressing: 2: Max-Patient completed 25-49% of tasks  FIM - Toileting Toileting: 0: Activity did not occur  FIM - Air cabin crew Transfers: 0-Activity did not occur  FIM - Control and instrumentation engineer Devices: Walker;Arm rests Bed/Chair Transfer: 3: Supine > Sit: Mod A (lifting assist/Pt. 50-74%/lift 2 legs;3: Sit > Supine: Mod A (lifting assist/Pt. 50-74%/lift 2 legs);4: Bed > Chair or W/C: Min A (steadying Pt. > 75%);4: Chair or W/C > Bed: Min A (steadying Pt. > 75%)  FIM - Locomotion: Wheelchair Distance: 25' Locomotion: Wheelchair: 2: Travels 50 - 149 ft with maximal assistance (Pt: 25 - 49%) FIM - Locomotion: Ambulation Locomotion: Ambulation Assistive Devices: Administrator Ambulation/Gait Assistance: 3: Mod assist Locomotion: Ambulation: 2:  Travels 50 - 149 ft with moderate assistance (Pt: 50 - 74%)  Comprehension Comprehension Mode:  Auditory Comprehension: 5-Follows basic conversation/direction: With extra time/assistive device  Expression Expression Mode: Verbal Expression: 2-Expresses basic 25 - 49% of the time/requires cueing 50 - 75% of the time. Uses single words/gestures.  Social Interaction Social Interaction: 4-Interacts appropriately 75 - 89% of the time - Needs redirection for appropriate language or to initiate interaction.  Problem Solving Problem Solving: 2-Solves basic 25 - 49% of the time - needs direction more than half the time to initiate, plan or complete simple activities  Memory Memory: 2-Recognizes or recalls 25 - 49% of the time/requires cueing 51 - 75% of the time  Medical Problem List and Plan:  1. Left subdural hematoma after a fall. Status post craniotomy evacuation of hematoma 10/07/2013  2. DVT Prophylaxis/Anticoagulation: SCDs. Monitor for any signs of DVT  3. Pain Management: Ultram as needed. Monitor with increased mobility  4. Neuropsych: This patient is not capable of making decisions on his own behalf  5.Dysphagia.Dysphgia 3 nectar liquids.Follow up speech therapy.  6. Seizure prophylaxis. Keppra 500 mg twice a day.  7. Parkinson's disease. Continue Sinemet as directed.  .8 Diabetes mellitus with peripheral neuropathy. Latest hemoglobin A1c 8.3. Check blood sugars a.c. and at bedtime. Continue tradjenta 5 mg daily.   -improving control 9. Hypertension. Norvasc 10 mg daily, Lasix 20 mg daily. Monitor with increased mobility  10. Hyperlipidemia. Zocor  11. History of atrial fibrillation. No anticoagulants prior to admission. Cardiac rate controlled   -continue to replace potassium 12. Asthma. Singulair 10 mg each bedtime. No shortness of breath reported  13. BPH. Flomax 0.4 mg daily. 14. Hypothyroidism. Synthroid  15. Obstructive sleep apnea. Nasal cpap 16. Leukocytosis---recheck next week--no signs of infection at present--trending down 17. Dysphagia: mulitfactorial D3 thins  per SLP  -appetite seems to be picking up  -advised wife to encourage him and observe  - glucerna tid and hs  -added megace  -full supervision for now    [facility] 10/22/2013 11:55 AM

## 2013-10-23 ENCOUNTER — Inpatient Hospital Stay (HOSPITAL_COMMUNITY): Payer: Medicare Other | Admitting: Speech Pathology

## 2013-10-23 LAB — GLUCOSE, CAPILLARY
Glucose-Capillary: 105 mg/dL — ABNORMAL HIGH (ref 70–99)
Glucose-Capillary: 136 mg/dL — ABNORMAL HIGH (ref 70–99)
Glucose-Capillary: 203 mg/dL — ABNORMAL HIGH (ref 70–99)
Glucose-Capillary: 161 mg/dL — ABNORMAL HIGH (ref 70–99)

## 2013-10-23 MED ORDER — POTASSIUM CHLORIDE 20 MEQ/15ML (10%) PO LIQD
20.0000 meq | Freq: Two times a day (BID) | ORAL | Status: DC
  Administered 2013-10-23 – 2013-10-25 (×4): 20 meq via ORAL
  Filled 2013-10-23 (×6): qty 15

## 2013-10-23 MED ORDER — PANTOPRAZOLE SODIUM 40 MG PO PACK
40.0000 mg | PACK | Freq: Every day | ORAL | Status: DC
  Administered 2013-10-23 – 2013-10-27 (×5): 40 mg
  Filled 2013-10-23 (×6): qty 20

## 2013-10-23 NOTE — Progress Notes (Signed)
Entered Pt room to find Pt already on his CPAP for the night.

## 2013-10-23 NOTE — Progress Notes (Signed)
[place] PHYSICAL MEDICINE & REHABILITATION     PROGRESS NOTE    Subjective/Complaints: Wife notes some coughing last noc Pt denies SOB A 12 point review of systems has been performed and if not noted above is otherwise negative.   Objective: Vital Signs: Blood pressure 163/78, pulse 76, temperature 97.1 F (36.2 C), temperature source Oral, resp. rate 18, height 5\' 10"  (1.778 m), weight 98.884 kg (218 lb), SpO2 95.00%. No results found. No results found for this basename: WBC, HGB, HCT, PLT,  in the last 72 hours No results found for this basename: NA, K, CL, CO, GLUCOSE, BUN, CREATININE, CALCIUM,  in the last 72 hours CBG (last 3)   Recent Labs  10/22/13 1606 10/22/13 2050 10/23/13 0727  GLUCAP 160* 153* 105*    Wt Readings from Last 3 Encounters:  10/20/13 98.884 kg (218 lb)  10/09/13 105 kg (231 lb 7.7 oz)  10/09/13 105 kg (231 lb 7.7 oz)    Physical Exam:  Eyes: EOM are normal.  Neck: Normal range of motion. Neck supple. No thyromegaly present.  Cardiovascular:  Cardiac rate controlled. Reg rhythm  Respiratory: Effort normal and breath sounds normal. No respiratory distress.  GI: Soft. Bowel sounds are normal. He exhibits no distension. Non-tender Neurological:  Quite alert. Delayed processing.  Flat facies. He was able to provide his age place as well as date of birth.  His speech was a bit dysarthric but intelligible. Slow to process. Rigidity in all 4 limbs. RUE is 3- to 3/5 deltoid, bicep, tricep, HI. LUE is 1+ deltoid/shoulder (RTC), 3-bicep, tricep, 3 hand.Marland Kitchen RLE is3-/5 prox HF, KE, to 2-distally at ankle with ADF/APF---inconsistent (but better) initiating movement. LLE 3- HF, 3 KE, ankle. Senses pain throughout on right.  Skin:  Craniotomy site clean, staples out Psych:   Affect more dynamic. occasionallly jokes and kids around   Assessment/Plan: 1. Functional deficits secondary to left SDH after fall which require 3+ hours per day of interdisciplinary  therapy in a comprehensive inpatient rehab setting. Physiatrist is providing close team supervision and 24 hour management of active medical problems listed below. Physiatrist and rehab team continue to assess barriers to discharge/monitor patient progress toward functional and medical goals.    FIM: FIM - Bathing Bathing Steps Patient Completed: Chest;Abdomen;Right upper leg;Left upper leg;Right lower leg (including foot);Left lower leg (including foot);Right Arm Bathing: 3: Mod-Patient completes 5-7 38f 10 parts or 50-74%  FIM - Upper Body Dressing/Undressing Upper body dressing/undressing steps patient completed: Thread/unthread right sleeve of pullover shirt/dresss;Put head through opening of pull over shirt/dress;Pull shirt over trunk Upper body dressing/undressing: 4: Min-Patient completed 75 plus % of tasks FIM - Lower Body Dressing/Undressing Lower body dressing/undressing steps patient completed: Thread/unthread left pants leg;Don/Doff right sock;Thread/unthread right pants leg;Don/Doff left shoe Lower body dressing/undressing: 2: Max-Patient completed 25-49% of tasks  FIM - Toileting Toileting: 0: Activity did not occur  FIM - Air cabin crew Transfers: 0-Activity did not occur  FIM - Control and instrumentation engineer Devices: Walker;Arm rests Bed/Chair Transfer: 4: Bed > Chair or W/C: Min A (steadying Pt. > 75%);4: Chair or W/C > Bed: Min A (steadying Pt. > 75%)  FIM - Locomotion: Wheelchair Distance: 25' Locomotion: Wheelchair: 1: Total Assistance/staff pushes wheelchair (Pt<25%) FIM - Locomotion: Ambulation Locomotion: Ambulation Assistive Devices: Administrator Ambulation/Gait Assistance: 3: Mod assist Locomotion: Ambulation: 2: Travels 50 - 149 ft with moderate assistance (Pt: 50 - 74%)  Comprehension Comprehension Mode: Auditory Comprehension: 5-Follows basic conversation/direction: With extra time/assistive  device  Expression Expression Mode: Verbal Expression: 2-Expresses basic 25 - 49% of the time/requires cueing 50 - 75% of the time. Uses single words/gestures.  Social Interaction Social Interaction: 4-Interacts appropriately 75 - 89% of the time - Needs redirection for appropriate language or to initiate interaction.  Problem Solving Problem Solving: 2-Solves basic 25 - 49% of the time - needs direction more than half the time to initiate, plan or complete simple activities  Memory Memory: 2-Recognizes or recalls 25 - 49% of the time/requires cueing 51 - 75% of the time  Medical Problem List and Plan:  1. Left subdural hematoma after a fall. Status post craniotomy evacuation of hematoma 10/07/2013  2. DVT Prophylaxis/Anticoagulation: SCDs. Monitor for any signs of DVT  3. Pain Management: Ultram as needed. Monitor with increased mobility  4. Neuropsych: This patient is not capable of making decisions on his own behalf  5.Dysphagia.Dysphgia 3 nectar liquids.Follow up speech therapy.  6. Seizure prophylaxis. Keppra 500 mg twice a day.  7. Parkinson's disease. Continue Sinemet as directed.  .8 Diabetes mellitus with peripheral neuropathy. Latest hemoglobin A1c 8.3. Check blood sugars a.c. and at bedtime. Continue tradjenta 5 mg daily.   -improving control 9. Hypertension. Norvasc 10 mg daily, Lasix 20 mg daily. Monitor with increased mobility  10. Hyperlipidemia. Zocor  11. History of atrial fibrillation. No anticoagulants prior to admission. Cardiac rate controlled   -continue to replace potassium 12. Asthma. Singulair 10 mg each bedtime. No shortness of breath reported  13. BPH. Flomax 0.4 mg daily. 14. Hypothyroidism. Synthroid  15. Obstructive sleep apnea. Nasal cpap 16. Leukocytosis---recheck next week--no signs of infection at present--trending down 17. Dysphagia: mulitfactorial D3 thins per SLP  -appetite seems to be picking up  -advised wife to encourage him and observe  -  glucerna tid and hs  -added megace  -full supervision for now    LOS (Days) 10 A FACE TO FACE EVALUATION WAS PERFORMED  Alysia Penna E 10/23/2013 10:59 AM

## 2013-10-23 NOTE — Progress Notes (Signed)
Speech Language Pathology Daily Session Note  Patient Details  Name: Joshua Glass MRN: [id number] Date of Birth: [birth year]  Today's Date: 10/23/2013 Time: 1300-1330 Time Calculation (min): 30 min  Short Term Goals: Week 2: SLP Short Term Goal 1 (Week 2): Patient will utilize speech intelligibility strategies with Min multimodal cues at the word level. SLP Short Term Goal 2 (Week 2): Patient will initiate verbal expression with Min multimodal cues.  SLP Short Term Goal 3 (Week 2): Patient will complete basic problem solving activities with Min multimodal cues.  SLP Short Term Goal 4 (Week 2): Patient will attend to functional task for 15 minutes with Mod verbal cues. SLP Short Term Goal 5 (Week 2): Patient will utilize external aids to assist in recall of new and daily information with Mod multimodal cues. SLP Short Term Goal 6 (Week 2): Patient will consume current diet without overt s/s of aspiration with Min verbal cues to utilize safe swallow strategies.  Skilled Therapeutic Interventions: Skilled intervention for cognition and expressive language complete.  Patient required moderate verbal cues to implement speech intelligibility strategies at phrase level.  Moderate assistance was also necessary to complete basic problem solving activities.  He was able to attend to activities for greater than 15 minutes.  Continue with current treatment plan.    FIM:  Comprehension Comprehension Mode: Auditory Comprehension: 5-Follows basic conversation/direction: With extra time/assistive device Expression Expression Mode: Verbal Expression: 2-Expresses basic 25 - 49% of the time/requires cueing 50 - 75% of the time. Uses single words/gestures.  Pain Pain Assessment Pain Assessment: No/denies pain  Therapy/Group: Individual Therapy  Frances Maywood 10/23/2013, 4:15 PM

## 2013-10-23 NOTE — Progress Notes (Signed)
Social Work Patient ID: Joshua Glass, male   DOB: 04-12-1933, 80 y.o.   MRN: [id number]  [place] with pt's wife this afternoon and she reports she and pt are agreed to change d/c plan to SNF.  She has given me preferences.  Will send out FL2 and keep pt, wife and team posted.  Cori Henningsen, LCSW

## 2013-10-24 ENCOUNTER — Inpatient Hospital Stay (HOSPITAL_COMMUNITY): Payer: Medicare Other

## 2013-10-24 LAB — GLUCOSE, CAPILLARY
GLUCOSE-CAPILLARY: 161 mg/dL — AB (ref 70–99)
GLUCOSE-CAPILLARY: 157 mg/dL — AB (ref 70–99)
GLUCOSE-CAPILLARY: 136 mg/dL — AB (ref 70–99)
Glucose-Capillary: 110 mg/dL — ABNORMAL HIGH (ref 70–99)

## 2013-10-24 NOTE — Progress Notes (Signed)
Pt's wife at bedside and stated she will place his CPAP on when he is ready. RT will continue to monitor

## 2013-10-24 NOTE — Progress Notes (Signed)
[place] PHYSICAL MEDICINE & REHABILITATION     PROGRESS NOTE    Subjective/Complaints: Pt denies coughing  Denies bowel issues, taking 75-100% meals Pt denies SOB A 12 point review of systems has been performed and if not noted above is otherwise negative.   Objective: Vital Signs: Blood pressure 163/76, pulse 60, temperature 97.7 F (36.5 C), temperature source Oral, resp. rate 18, height 5\' 10"  (1.778 m), weight 98.884 kg (218 lb), SpO2 97.00%. No results found. No results found for this basename: WBC, HGB, HCT, PLT,  in the last 72 hours No results found for this basename: NA, K, CL, CO, GLUCOSE, BUN, CREATININE, CALCIUM,  in the last 72 hours CBG (last 3)   Recent Labs  10/23/13 1644 10/23/13 2110 10/24/13 0723  GLUCAP 203* 161* 161*    Wt Readings from Last 3 Encounters:  10/20/13 98.884 kg (218 lb)  10/09/13 105 kg (231 lb 7.7 oz)  10/09/13 105 kg (231 lb 7.7 oz)    Physical Exam:  Eyes: EOM are normal.  Neck: Normal range of motion. Neck supple. No thyromegaly present.  Cardiovascular:  Cardiac rate controlled. Reg rhythm  Respiratory: Effort normal and breath sounds normal. No respiratory distress.  GI: Soft. Bowel sounds are normal. He exhibits no distension. Non-tender Neurological:  Quite alert. Delayed processing.  Flat facies. He was able to provide his age place as well as date of birth.  His speech was a bit dysarthric but intelligible. Slow to process. Rigidity in all 4 limbs. RUE is 3- to 3/5 deltoid, bicep, tricep, HI. LUE is 1+ deltoid/shoulder (RTC), 3-bicep, tricep, 3 hand.Marland Kitchen RLE is3-/5 prox HF, KE, to 2-distally at ankle with ADF/APF---inconsistent (but better) initiating movement. LLE 3- HF, 3 KE, ankle. Senses pain throughout on right.  Skin:  Craniotomy site clean, staples out Psych:   Affect more dynamic. occasionallly jokes and kids around   Assessment/Plan: 1. Functional deficits secondary to left SDH after fall which require 3+ hours  per day of interdisciplinary therapy in a comprehensive inpatient rehab setting. Physiatrist is providing close team supervision and 24 hour management of active medical problems listed below. Physiatrist and rehab team continue to assess barriers to discharge/monitor patient progress toward functional and medical goals.    FIM: FIM - Bathing Bathing Steps Patient Completed: Chest;Abdomen;Right upper leg;Left upper leg;Right lower leg (including foot);Left lower leg (including foot);Right Arm Bathing: 3: Mod-Patient completes 5-7 55f 10 parts or 50-74%  FIM - Upper Body Dressing/Undressing Upper body dressing/undressing steps patient completed: Thread/unthread right sleeve of pullover shirt/dresss;Put head through opening of pull over shirt/dress;Pull shirt over trunk Upper body dressing/undressing: 4: Min-Patient completed 75 plus % of tasks FIM - Lower Body Dressing/Undressing Lower body dressing/undressing steps patient completed: Thread/unthread left pants leg;Don/Doff right sock;Thread/unthread right pants leg;Don/Doff left shoe Lower body dressing/undressing: 2: Max-Patient completed 25-49% of tasks  FIM - Toileting Toileting: 0: Activity did not occur  FIM - Air cabin crew Transfers: 0-Activity did not occur  FIM - Control and instrumentation engineer Devices: Walker;Arm rests Bed/Chair Transfer: 4: Bed > Chair or W/C: Min A (steadying Pt. > 75%);4: Chair or W/C > Bed: Min A (steadying Pt. > 75%)  FIM - Locomotion: Wheelchair Distance: 25' Locomotion: Wheelchair: 1: Total Assistance/staff pushes wheelchair (Pt<25%) FIM - Locomotion: Ambulation Locomotion: Ambulation Assistive Devices: Administrator Ambulation/Gait Assistance: 3: Mod assist Locomotion: Ambulation: 2: Travels 50 - 149 ft with moderate assistance (Pt: 50 - 74%)  Comprehension Comprehension Mode: Auditory Comprehension: 5-Follows basic  conversation/direction: With extra time/assistive  device  Expression Expression Mode: Verbal Expression: 3-Expresses basic 50 - 74% of the time/requires cueing 25 - 50% of the time. Needs to repeat parts of sentences.  Social Interaction Social Interaction: 4-Interacts appropriately 75 - 89% of the time - Needs redirection for appropriate language or to initiate interaction.  Problem Solving Problem Solving: 3-Solves basic 50 - 74% of the time/requires cueing 25 - 49% of the time  Memory Memory: 3-Recognizes or recalls 50 - 74% of the time/requires cueing 25 - 49% of the time  Medical Problem List and Plan:  1. Left subdural hematoma after a fall. Status post craniotomy evacuation of hematoma 10/07/2013  2. DVT Prophylaxis/Anticoagulation: SCDs. Monitor for any signs of DVT  3. Pain Management: Ultram as needed. Monitor with increased mobility  4. Neuropsych: This patient is not capable of making decisions on his own behalf  5.Dysphagia.Dysphgia 3 nectar liquids.Follow up speech therapy.  6. Seizure prophylaxis. Keppra 500 mg twice a day.  7. Parkinson's disease. Continue Sinemet as directed.  .8 Diabetes mellitus with peripheral neuropathy. Latest hemoglobin A1c 8.3. Check blood sugars a.c. and at bedtime. Continue tradjenta 5 mg daily.   -improving control 9. Hypertension. Norvasc 10 mg daily, Lasix 20 mg daily. Monitor with increased mobility  10. Hyperlipidemia. Zocor  11. History of atrial fibrillation. No anticoagulants prior to admission. Cardiac rate controlled   -continue to replace potassium 12. Asthma. Singulair 10 mg each bedtime. No shortness of breath reported  13. BPH. Flomax 0.4 mg daily. 14. Hypothyroidism. Synthroid  15. Obstructive sleep apnea. Nasal cpap 16. Leukocytosis---recheck next week--no signs of infection at present--trending down 17. Dysphagia: mulitfactorial D3 thins per SLP  -appetite seems to be picking up   - glucerna tid and hs  -added megace  -full supervision for now    LOS (Days) 11 A  FACE TO FACE EVALUATION WAS PERFORMED  Ashraf Mesta E 10/24/2013 9:22 AM

## 2013-10-24 NOTE — Progress Notes (Signed)
Physical Therapy Session Note  Patient Details  Name: Joshua Glass MRN: [id number] Date of Birth: [birth year]  Today's Date: 10/24/2013 Time: 1100-1200 Time Calculation (min): 60 min  Skilled Therapeutic Interventions/Progress Updates:  1:1. Pt received supine in bed, w/ nurse tech changing brief. Therapist stepping in to assist and initiate tx session. Emphasis on B rolling during brief change, req min A to achieve full sidelying w/ use of bed rails. Therapist educating wife regarding appropriate cues to give pt such as 1 simple step at a time, use of non-verbal cues and increased time for processing so as to allow pt to do more and her to do less. Wife assisting pt w/ t/f sup>sit EOB using log roll technique, pt req overall mod A and therapist stepping into assist due to amount of physical assist required. Pt req max A to complete lower body dressing while sitting EOB. Wife attempting to assist pt w/ ambulation as well, however, therapist continued to keep hands on patient for safety. Pt amb 75'x2 w/ min to occasional mod A. Pt demonstrates decreased need for physical assist during ambulation when keeping eyes on a target that helps promote overall head/trunk extension. Additional practice on t/f sup<>sit w/ use of log roll technique on therapy mat w/ overall mod A. Use of NuStep at end of session to target gross NMR. Pt sitting in w/c at end of session w/ all needs in reach, quick release belt in place and wife in room.   Therapy Documentation Precautions:  Precautions Precautions: Fall Precaution Comments: hx of multiple falls due to Parkinsons, HOH Restrictions Weight Bearing Restrictions: No  See FIM for current functional status  Therapy/Group: Individual Therapy  [name] 10/24/2013, 12:32 PM

## 2013-10-25 ENCOUNTER — Inpatient Hospital Stay (HOSPITAL_COMMUNITY): Payer: Medicare Other

## 2013-10-25 ENCOUNTER — Encounter (HOSPITAL_COMMUNITY): Payer: Medicare Other

## 2013-10-25 ENCOUNTER — Inpatient Hospital Stay (HOSPITAL_COMMUNITY): Payer: Medicare Other | Admitting: Speech Pathology

## 2013-10-25 DIAGNOSIS — S065X9A Traumatic subdural hemorrhage with loss of consciousness of unspecified duration, initial encounter: Secondary | ICD-10-CM

## 2013-10-25 DIAGNOSIS — E1149 Type 2 diabetes mellitus with other diabetic neurological complication: Secondary | ICD-10-CM

## 2013-10-25 DIAGNOSIS — E1142 Type 2 diabetes mellitus with diabetic polyneuropathy: Secondary | ICD-10-CM

## 2013-10-25 DIAGNOSIS — G40919 Epilepsy, unspecified, intractable, without status epilepticus: Secondary | ICD-10-CM

## 2013-10-25 DIAGNOSIS — I10 Essential (primary) hypertension: Secondary | ICD-10-CM

## 2013-10-25 DIAGNOSIS — G20 Parkinson's disease: Secondary | ICD-10-CM

## 2013-10-25 DIAGNOSIS — S065XAA Traumatic subdural hemorrhage with loss of consciousness status unknown, initial encounter: Secondary | ICD-10-CM

## 2013-10-25 DIAGNOSIS — R1310 Dysphagia, unspecified: Secondary | ICD-10-CM

## 2013-10-25 LAB — GLUCOSE, CAPILLARY
GLUCOSE-CAPILLARY: 126 mg/dL — AB (ref 70–99)
GLUCOSE-CAPILLARY: 146 mg/dL — AB (ref 70–99)
Glucose-Capillary: 133 mg/dL — ABNORMAL HIGH (ref 70–99)
Glucose-Capillary: 190 mg/dL — ABNORMAL HIGH (ref 70–99)

## 2013-10-25 MED ORDER — GUAIFENESIN 100 MG/5ML PO SOLN
15.0000 mL | Freq: Four times a day (QID) | ORAL | Status: DC | PRN
  Filled 2013-10-25: qty 15

## 2013-10-25 MED ORDER — POTASSIUM CHLORIDE CRYS ER 20 MEQ PO TBCR
20.0000 meq | EXTENDED_RELEASE_TABLET | Freq: Two times a day (BID) | ORAL | Status: DC
  Administered 2013-10-25 – 2013-10-28 (×6): 20 meq via ORAL
  Filled 2013-10-25 (×9): qty 1

## 2013-10-25 NOTE — Progress Notes (Signed)
Speech Language Pathology Daily Session Note  Patient Details  Name: Joshua Glass MRN: [id number] Date of Birth: 11/12/1932  Today's Date: 10/25/2013 Time: 5701-7793 Time Calculation (min): 45 min  Short Term Goals: Week 2: SLP Short Term Goal 1 (Week 2): Patient will utilize speech intelligibility strategies with Min multimodal cues at the word level. SLP Short Term Goal 2 (Week 2): Patient will initiate verbal expression with Min multimodal cues.  SLP Short Term Goal 3 (Week 2): Patient will complete basic problem solving activities with Min multimodal cues.  SLP Short Term Goal 4 (Week 2): Patient will attend to functional task for 15 minutes with Mod verbal cues. SLP Short Term Goal 5 (Week 2): Patient will utilize external aids to assist in recall of new and daily information with Mod multimodal cues. SLP Short Term Goal 6 (Week 2): Patient will consume current diet without overt s/s of aspiration with Min verbal cues to utilize safe swallow strategies.  Skilled Therapeutic Interventions: Skilled therapeutic intervention addressed cognitive goals. Student provided Mod verbal cues to sustain attention for greater than 5 minutes; suspect fatigue led to decreased sustained attention. Patient also required Mod multimodal cues to complete basic math task with playing cards, for verbal initiation, and to utilize speech intelligibility strategies. Patient required Max multimodal cues to complete a complex naming task. Continue with current plan of care.   FIM:  Comprehension Comprehension Mode: Auditory Comprehension: 5-Follows basic conversation/direction: With extra time/assistive device Expression Expression Mode: Verbal Expression: 3-Expresses basic 50 - 74% of the time/requires cueing 25 - 50% of the time. Needs to repeat parts of sentences. Social Interaction Social Interaction: 4-Interacts appropriately 75 - 89% of the time - Needs redirection for appropriate language or to initiate  interaction. Problem Solving Problem Solving: 3-Solves basic 50 - 74% of the time/requires cueing 25 - 49% of the time Memory Memory: 3-Recognizes or recalls 50 - 74% of the time/requires cueing 25 - 49% of the time FIM - Eating Eating Activity: 0: Activity did not occur  Pain Pain Assessment Pain Assessment: Glass/denies pain  Therapy/Group: Individual Therapy  Akasha Melena 10/25/2013, 1:07 PM

## 2013-10-25 NOTE — Progress Notes (Signed)
The skilled treatment note has been reviewed and SLP is in agreement.  Luie Laneve, M.A., CCC-SLP  [phone]   

## 2013-10-25 NOTE — Progress Notes (Signed)
Dr. Letta Pate notified of patient's wife's concern of patient coughing episodes now after being given liquid meds earlier tonight; has taken chocolate pudding and water since taking meds without any problems; wife says patient has had a cough for about a year, but is concerned about the episodes tonight because the patient is waking up with them.  Orders received:  PA and lateral Chest xray in am due to coughing.  Guaifenesin 15 ml q 6 hrs prn cough.

## 2013-10-25 NOTE — Progress Notes (Signed)
Occupational Therapy Session Note  Patient Details  Name: DARREK LEASURE MRN: [id number] Date of Birth: 12-10-1932  Today's Date: 10/25/2013 Time: 0800-0910 Time Calculation (min): 70 min  Short Term Goals: Week 2:  OT Short Term Goal 1 (Week 2): STG=LTG secondary to LOS   Skilled Therapeutic Interventions/Progress Updates:    Pt engaged in bathing at shower level and dressing with sit<>stand from w/c at sink.  Pt exhibited increased task initiation this morning and performed all sit<>stand in shower and at sink with supervision/stead A.  Pt continues to required extra time to initiate tasks and often "freezes" during tasks requiring min verbal cues to reinitiate.  Focus on bed mobility, transfers, sit<>stand, task initiation, attention to task, standing balance, activity tolerance, and safety awareness.  Therapy Documentation Precautions:  Precautions Precautions: Fall Precaution Comments: hx of multiple falls due to Parkinsons, HOH Restrictions Weight Bearing Restrictions: No Pain Assessment Pain Assessment: No/denies pain Pain Score: 0-No pain  See FIM for current functional status  Therapy/Group: Individual Therapy  Leroy Libman 10/25/2013, 9:23 AM

## 2013-10-25 NOTE — Progress Notes (Signed)
Joshua Glass PHYSICAL MEDICINE & REHABILITATION     PROGRESS NOTE    Subjective/Complaints: Cough last night after eating---didn'Glass clear initially. Denies any problems this morning. A 12 point review of systems has been performed and if not noted above is otherwise negative.   Objective: Vital Signs: Blood pressure 179/83, pulse 68, temperature 97.5 F (36.4 C), temperature source Oral, resp. rate 20, height 5\' 10"  (1.778 m), weight 98.884 kg (218 lb), SpO2 94.00%. No results found. No results found for this basename: WBC, HGB, HCT, PLT,  in the last 72 hours No results found for this basename: NA, K, CL, CO, GLUCOSE, BUN, CREATININE, CALCIUM,  in the last 72 hours CBG (last 3)   Recent Labs  10/24/13 1643 10/24/13 2056 10/25/13 0728  GLUCAP 157* 136* 133*    Wt Readings from Last 3 Encounters:  10/20/13 98.884 kg (218 lb)  10/09/13 105 kg (231 lb 7.7 oz)  10/09/13 105 kg (231 lb 7.7 oz)    Physical Exam:  Eyes: EOM are normal.  Neck: Normal range of motion. Neck supple. No thyromegaly present.  Cardiovascular:  Cardiac rate controlled. Reg rhythm  Respiratory: Effort normal and breath sounds normal. No respiratory distress.  GI: Soft. Bowel sounds are normal. He exhibits no distension. Non-tender Neurological:  Quite alert. Delayed processing.  Flat facies. He was able to provide his age place as well as date of birth.  His speech was a bit dysarthric but intelligible. Slow to process. Rigidity in all 4 limbs. RUE is 3- to 3/5 deltoid, bicep, tricep, HI. LUE is 1+ deltoid/shoulder (RTC), 3-bicep, tricep, 3 hand.Marland Kitchen RLE is3-/5 prox HF, KE, to 2-distally at ankle with ADF/APF---inconsistent (but better) initiating movement. LLE 3- HF, 3 KE, ankle. Senses pain throughout on right.  Skin:  Craniotomy site clean, staples out Psych:   Affect more dynamic. occasionallly jokes and kids around   Assessment/Plan: 1. Functional deficits secondary to left SDH after fall which  require 3+ hours per day of interdisciplinary therapy in a comprehensive inpatient rehab setting. Physiatrist is providing close team supervision and 24 hour management of active medical problems listed below. Physiatrist and rehab team continue to assess barriers to discharge/monitor patient progress toward functional and medical goals.    FIM: FIM - Bathing Bathing Steps Patient Completed: Chest;Abdomen;Right upper leg;Left upper leg;Right lower leg (including foot);Left lower leg (including foot);Right Arm Bathing: 3: Mod-Patient completes 5-7 56f 10 parts or 50-74%  FIM - Upper Body Dressing/Undressing Upper body dressing/undressing steps patient completed: Thread/unthread right sleeve of pullover shirt/dresss;Put head through opening of pull over shirt/dress;Pull shirt over trunk Upper body dressing/undressing: 4: Min-Patient completed 75 plus % of tasks FIM - Lower Body Dressing/Undressing Lower body dressing/undressing steps patient completed: Thread/unthread left pants leg;Pull pants up/down Lower body dressing/undressing: 2: Max-Patient completed 25-49% of tasks  FIM - Toileting Toileting: 0: Activity did not occur  FIM - Air cabin crew Transfers: 0-Activity did not occur  FIM - Control and instrumentation engineer Devices: Environmental consultant;Arm rests Bed/Chair Transfer: 3: Bed > Chair or W/C: Mod A (lift or lower assist);3: Chair or W/C > Bed: Mod A (lift or lower assist)  FIM - Locomotion: Wheelchair Distance: 25' Locomotion: Wheelchair: 1: Total Assistance/staff pushes wheelchair (Pt<25%) FIM - Locomotion: Ambulation Locomotion: Ambulation Assistive Devices: Administrator Ambulation/Gait Assistance: 3: Mod assist Locomotion: Ambulation: 2: Travels 50 - 149 ft with moderate assistance (Pt: 50 - 74%)  Comprehension Comprehension Mode: Auditory Comprehension: 5-Follows basic conversation/direction: With extra time/assistive device  Expression Expression  Mode: Verbal Expression: 3-Expresses basic 50 - 74% of the time/requires cueing 25 - 50% of the time. Needs to repeat parts of sentences.  Social Interaction Social Interaction: 4-Interacts appropriately 75 - 89% of the time - Needs redirection for appropriate language or to initiate interaction.  Problem Solving Problem Solving: 3-Solves basic 50 - 74% of the time/requires cueing 25 - 49% of the time  Memory Memory: 3-Recognizes or recalls 50 - 74% of the time/requires cueing 25 - 49% of the time  Medical Problem List and Plan:  1. Left subdural hematoma after a fall. Status post craniotomy evacuation of hematoma 10/07/2013  2. DVT Prophylaxis/Anticoagulation: SCDs. Monitor for any signs of DVT  3. Pain Management: Ultram as needed. Monitor with increased mobility  4. Neuropsych: This patient is not capable of making decisions on his own behalf  5.Dysphagia.Dysphgia 3 nectar liquids.Follow up speech therapy.  6. Seizure prophylaxis. Keppra 500 mg twice a day.  7. Parkinson's disease. Continue Sinemet as directed.  .8 Diabetes mellitus with peripheral neuropathy. Latest hemoglobin A1c 8.3. Check blood sugars a.c. and at bedtime. Continue tradjenta 5 mg daily.   -improving control 9. Hypertension. Norvasc 10 mg daily, Lasix 20 mg daily. Monitor with increased mobility  10. Hyperlipidemia. Zocor  11. History of atrial fibrillation. No anticoagulants prior to admission. Cardiac rate controlled   -continue to replace potassium 12. Asthma. Singulair 10 mg each bedtime. No shortness of breath reported  13. BPH. Flomax 0.4 mg daily. 14. Hypothyroidism. Synthroid  15. Obstructive sleep apnea. Nasal cpap 16. Leukocytosis---recheck next week--no signs of infection at present--trending down 17. Dysphagia: mulitfactorial D3 thins per SLP  -appetite seems to be picking up  -cxr pending today due to hs coughing  - glucerna tid and hs  -added megace  -full supervision for now    LOS (Days)  12 A FACE TO FACE EVALUATION WAS PERFORMED  Joshua Glass,Joshua Glass 10/25/2013 8:36 AM

## 2013-10-25 NOTE — Progress Notes (Addendum)
Physical Therapy Session Note  Patient Details  Name: Joshua Glass MRN: [id number] Date of Birth: 03/16/1933  Today's Date: 10/25/2013 Time: Treatment Session 1: 2202-5427; Treatment Session 2: 0623-7628; Treatment Session 3: 1345-1545 Time Calculation (min): Treatment Session 1: 56min; Treatment Session 2: 25 min; Treatment Session 3: 29min  Short Term Goals: Week 2:  PT Short Term Goal 1 (Week 2): = downgraded, unmet LTGs of overall mod A  Skilled Therapeutic Interventions/Progress Updates:  Treatment Session 1:  1:1. Pt received sitting in w/c, ready for therapy. Nurse requesting that pt eat breakfast before beginning physical mobility due to DM as he had not yet eaten. Following setup, therapist providing supervision during self-feeding w/ intermittent cueing to take small bites, clear throat and swallow what was in mouth before taking next bite. Gross NMR targeted this session through w/c propulsion and use of NuStep. Pt propelled w/c 100'x2, req max A for propulsion but goal to achieve consistent reciprocal pattern w/ B LE. Pt req mod cueing for selective attention to task in busy hallway environment. Pt with good tolerance to NuStep to target large reciprocal movements w/ B UE/LE, intermittent cues to sustain speed in 40-50 steps/min. Pt req close(S) for t/f sit<>stand w/ verbal and light tactile cues for initiation. Min guard A for SPT w/c<>NuStep. Pt sitting in w/c at end of session w/ all needs in reach, quick release belt in place.   Treatment Session 2:  1:1. Pt received sitting in w/c, ready for therapy. Focus this session on gait training. Pt amb 75'x2 and 100' w/ RW req overall min A to occasional mod A. Pt cued to maintain eyes on target during ambulation to promote head/trunk extension. Pt req min A when able to maintain attention on target, however, easily distracted by people passing in hall req up mod A due to anterior lean and management of RW. Intermittent cueing for increased B  foot clearance (L LE>R LE). Pt req (S) for t/f sit<>stand throughout session. HOH assist to find arm rests prior to sitting. Pt sitting in w/c at end of session w/ all needs in reach, quick release belt in place and wife in room.   Treatment Session 3:  1:1. Pt received sitting in w/c, ready for therapy. Focus this session on functional transfers and gait training. Practiced t/f sup<>sitx2 in standard bed w/ emphasis on log roll technique, pt req light mod A for B LE. Pt practiced multiple t/fs sit<>stand from variable height surfaces and arm rest availability, req min-mod A. Pt req min A for short distance ambulation on carpeted surface around furniture in solarium. Pt able to negotiate up/down 4 steps w/ use of B rail and overall min A w/ max step by step cueing for seq and safety. Pt extremely fearful of falling during negotiation down steps, therapist lowered step height to 3" and req max encouragement for completion. Pt supine in bed at end of session w/ all needs in reach, bed alarm on and RN aware.    LTGs upgraded to min A overall w/ exception of mod A for w/c propulsion due to improved initiation, balance and decreased need for physical assist.   Therapy Documentation Precautions:  Precautions Precautions: Fall Precaution Comments: hx of multiple falls due to Parkinsons, HOH Restrictions Weight Bearing Restrictions: No  See FIM for current functional status  Therapy/Group: Individual Therapy  [name] 10/25/2013, 12:01 PM

## 2013-10-26 ENCOUNTER — Inpatient Hospital Stay (HOSPITAL_COMMUNITY): Payer: Medicare Other | Admitting: Occupational Therapy

## 2013-10-26 ENCOUNTER — Encounter (HOSPITAL_COMMUNITY): Payer: Medicare Other

## 2013-10-26 ENCOUNTER — Inpatient Hospital Stay (HOSPITAL_COMMUNITY): Payer: Medicare Other | Admitting: Speech Pathology

## 2013-10-26 ENCOUNTER — Inpatient Hospital Stay (HOSPITAL_COMMUNITY): Payer: Medicare Other

## 2013-10-26 ENCOUNTER — Inpatient Hospital Stay (HOSPITAL_COMMUNITY): Payer: Medicare Other | Admitting: Physical Therapy

## 2013-10-26 DIAGNOSIS — E1142 Type 2 diabetes mellitus with diabetic polyneuropathy: Secondary | ICD-10-CM

## 2013-10-26 DIAGNOSIS — S065XAA Traumatic subdural hemorrhage with loss of consciousness status unknown, initial encounter: Secondary | ICD-10-CM

## 2013-10-26 DIAGNOSIS — S065X9A Traumatic subdural hemorrhage with loss of consciousness of unspecified duration, initial encounter: Secondary | ICD-10-CM

## 2013-10-26 DIAGNOSIS — R1310 Dysphagia, unspecified: Secondary | ICD-10-CM

## 2013-10-26 DIAGNOSIS — G40919 Epilepsy, unspecified, intractable, without status epilepticus: Secondary | ICD-10-CM

## 2013-10-26 DIAGNOSIS — G20 Parkinson's disease: Secondary | ICD-10-CM

## 2013-10-26 DIAGNOSIS — E1149 Type 2 diabetes mellitus with other diabetic neurological complication: Secondary | ICD-10-CM

## 2013-10-26 DIAGNOSIS — I10 Essential (primary) hypertension: Secondary | ICD-10-CM

## 2013-10-26 LAB — GLUCOSE, CAPILLARY
GLUCOSE-CAPILLARY: 171 mg/dL — AB (ref 70–99)
GLUCOSE-CAPILLARY: 153 mg/dL — AB (ref 70–99)
GLUCOSE-CAPILLARY: 148 mg/dL — AB (ref 70–99)
Glucose-Capillary: 139 mg/dL — ABNORMAL HIGH (ref 70–99)
Glucose-Capillary: 166 mg/dL — ABNORMAL HIGH (ref 70–99)

## 2013-10-26 MED ORDER — ACETAMINOPHEN 325 MG PO TABS: 325.0000 mg | ORAL_TABLET | Freq: Four times a day (QID) | ORAL | Status: DC | PRN

## 2013-10-26 NOTE — Progress Notes (Signed)
The skilled treatment note has been reviewed and SLP is in agreement.  Alen Matheson, M.A., CCC-SLP  [phone]   

## 2013-10-26 NOTE — Patient Care Conference (Signed)
Inpatient RehabilitationTeam Conference and Plan of Care Update Date: 10/26/2013   Time: 3:00 PM    Patient Name: Joshua Glass      Medical Record Number: [id number]  Date of Birth: [birth year] Sex: Male         Room/Bed: 4W13C/4W13C-01 Payor Info: Payor: Holley [employer] MEDICARE / Plan: [employer] MEDICARE / Product Type: *No Product type* /    Admitting Diagnosis: L SDH crani evacuation s/p fall  Admit Date/Time:  10/13/2013  5:17 PM Admission Comments: No comment available   Primary Diagnosis:  <principal problem not specified> Principal Problem: <principal problem not specified>  Patient Active Problem List   Diagnosis Date Noted  . Diabetes mellitus type 1, uncontrolled, with neuro complications 64/40/3474  . Subdural hematoma, post-traumatic 10/07/2013  . Acute bronchitis 09/26/2013  . Gout 09/26/2013  . Fever 09/25/2013  . Sepsis 09/23/2013  . Dysphagia 09/23/2013  . Respiratory failure 09/22/2013  . Parkinson's syndrome 04/22/2013  . Abnormality of gait 04/22/2013  . Polyneuropathy in diabetes(357.2) 04/22/2013  . Complete tear of right rotator cuff 10/10/2011  . Gallstone   . SDH (subdural hematoma) 10/05/2011  . Fall 10/05/2011  . DIABETES MELLITUS 03/29/2008  . OBSTRUCTIVE SLEEP APNEA 03/29/2008  . HYPERTENSION 03/29/2008  . ALLERGIC RHINITIS 03/29/2008    Expected Discharge Date: Expected Discharge Date:  (SNF)  Team Members Present: Physician leading conference: Dr. Alger Simons Social Worker Present: Lennart Pall, LCSW Nurse Present: Dorthula Nettles, RN PT Present: Melene Plan, PT OT Present: Roanna Epley, COTA SLP Present: Weston Anna, SLP PPS Coordinator present : Daiva Nakayama, RN, CRRN;Becky Alwyn Ren, PT     Current Status/Progress Goal Weekly Team Focus  Medical   PERSISTENT PD SYMPTOMS WHICH CAN WAX AND WANE---EATING MUCH BETTER  IMPROVE ENGAGEMENT AND CONSISTENCY  SEE PRIOR, DAY TO DAY MGT OF NEURO SX   Bowel/Bladder   patient incontinent  bowel/bladder.  Condom cath q hs.  LBM 4/4.    continent bowel/bladder with min to mod assist  assess for voids q 2-3 hrs WA   Swallow/Nutrition/ Hydration   Dys. 3 textures with thin liquids via cup with full supervision, Mod-Max A for utilization of safe swallow strategies.   Mod  increased utilization of safe swallow strategies and diet tolerance   ADL's   mod A bathing, max A LB dressing, mod A transfers, extra time to initiate tasks  min A bathing, mod A LB dressing, toileting, min A UB dressing, min A transfers  activity tolerance, BADLs, transfers, standing balance   Mobility   Close(S)-min A for transfers, Min-mod A for ambulation up to 100' w/ RW, stairs and bed mobility; Max A for w/c propulsion  Goals were downgraded to mod A overall, however, upgraded back to min A overall due to improved iniation, balance and decreased need for physical assist. Mod A for w/c propusion  x25' in controlled environment.   Initiation, emergent awareness, safety during functional mobility, gross NMR, standing balance, ambulation   Communication   Mod cues for increased speech intelligibility and vocal intensity  Min A  increased intelligibility strategies and vocal intensity at the sentence level   Safety/Cognition/ Behavioral Observations  Mod for basic problem solving and sustained attention  Min A for   basic problem solving, initiation, and sustained attention in functional tasks   Pain   no c/o pain since 4/2;  Tylenol 650 mg q 4 hrs prn ordered  3 or less; painfree  monitor patient for pain and medicate prn   Skin  scalp incision healed; rash healed;  blanchable redness to bottom  remain free of infection and further skin breakdown  assess skin q shift; turn q 2 hrs and keep dry    Rehab Goals Patient on target to meet rehab goals: Yes *See Care Plan and progress notes for long and short-term goals.  Barriers to Discharge: SEE PRIOR. WIFE CAN'T MANAGE CURRENT LEVEL OF CARE    Possible  Resolutions to Barriers:  SUPERVISION AND PHYSICAL ASSIST AT HOME    Discharge Planning/Teaching Needs:  plan changed to SNF as wife cannot meet current care needs      Team Discussion:  SW reports d/c plan changed to SNF and hopeful to have bed offer within next couple of days.  Po intake better overall.  Good progress with therapies overall.  Revisions to Treatment Plan:  Change in d/c plan to SNF   Continued Need for Acute Rehabilitation Level of Care: The patient requires daily medical management by a physician with specialized training in physical medicine and rehabilitation for the following conditions: Daily direction of a multidisciplinary physical rehabilitation program to ensure safe treatment while eliciting the highest outcome that is of practical value to the patient.: Yes Daily medical management of patient stability for increased activity during participation in an intensive rehabilitation regime.: Yes Daily analysis of laboratory values and/or radiology reports with any subsequent need for medication adjustment of medical intervention for : Neurological problems;Post surgical problems  [name] 10/26/2013, 6:54 PM

## 2013-10-26 NOTE — Progress Notes (Signed)
[place] PHYSICAL MEDICINE & REHABILITATION     PROGRESS NOTE    Subjective/Complaints: No new issues this am. Up with therapy already. Appetite better. No cough A 12 point review of systems has been performed and if not noted above is otherwise negative.   Objective: Vital Signs: Blood pressure 162/77, pulse 70, temperature 97.1 F (36.2 C), temperature source Oral, resp. rate 19, height 5\' 10"  (1.778 m), weight 98.884 kg (218 lb), SpO2 94.00%. Dg Chest 2 View  10/26/2013   CLINICAL DATA:  Coughing  EXAM: CHEST  2 VIEW  COMPARISON:  Chest x-ray of 10/09/2013  FINDINGS: Aeration has improved slightly. No infiltrate or effusion is seen. Cardiomegaly is stable. No bony abnormality is noted.  IMPRESSION: Slightly better aeration.  No active lung disease.   Electronically Signed   By: Ivar Drape M.D.   On: 10/26/2013 08:07   No results found for this basename: WBC, HGB, HCT, PLT,  in the last 72 hours No results found for this basename: NA, K, CL, CO, GLUCOSE, BUN, CREATININE, CALCIUM,  in the last 72 hours CBG (last 3)   Recent Labs  10/25/13 1556 10/25/13 2138 10/26/13 0727  GLUCAP 190* 146* 139*    Wt Readings from Last 3 Encounters:  10/20/13 98.884 kg (218 lb)  10/09/13 105 kg (231 lb 7.7 oz)  10/09/13 105 kg (231 lb 7.7 oz)    Physical Exam:  Eyes: EOM are normal.  Neck: Normal range of motion. Neck supple. No thyromegaly present.  Cardiovascular:  Cardiac rate controlled. Reg rhythm  Respiratory: Effort normal and breath sounds normal. No respiratory distress.  GI: Soft. Bowel sounds are normal. He exhibits no distension. Non-tender Neurological:  Quite alert. Delayed processing.  Flat facies. He was able to provide his age place as well as date of birth.  His speech was a bit dysarthric but intelligible. Slow to process. Rigidity in all 4 limbs. RUE is 3- to 3/5 deltoid, bicep, tricep, HI. LUE is 1+ deltoid/shoulder (RTC), 3-bicep, tricep, 3 hand.Marland Kitchen RLE is3-/5 prox  HF, KE, to 2-distally at ankle with ADF/APF---inconsistent (but better) initiating movement. LLE 3- HF, 3 KE, ankle. Senses pain throughout on right.  Skin:  Craniotomy site clean, staples out Psych:   Affect more dynamic. occasionallly jokes and kids around   Assessment/Plan: 1. Functional deficits secondary to left SDH after fall which require 3+ hours per day of interdisciplinary therapy in a comprehensive inpatient rehab setting. Physiatrist is providing close team supervision and 24 hour management of active medical problems listed below. Physiatrist and rehab team continue to assess barriers to discharge/monitor patient progress toward functional and medical goals.    FIM: FIM - Bathing Bathing Steps Patient Completed: Chest;Right Arm;Left Arm;Abdomen;Front perineal area;Right upper leg;Left upper leg Bathing: 3: Mod-Patient completes 5-7 22f 10 parts or 50-74%  FIM - Upper Body Dressing/Undressing Upper body dressing/undressing steps patient completed: Pull shirt over trunk;Thread/unthread left sleeve of pullover shirt/dress Upper body dressing/undressing: 3: Mod-Patient completed 50-74% of tasks FIM - Lower Body Dressing/Undressing Lower body dressing/undressing steps patient completed: Thread/unthread left pants leg;Don/Doff left sock;Don/Doff left shoe Lower body dressing/undressing: 2: Max-Patient completed 25-49% of tasks  FIM - Toileting Toileting: 0: Activity did not occur  FIM - Air cabin crew Transfers: 0-Activity did not occur  FIM - Control and instrumentation engineer Devices: Walker;Arm rests Bed/Chair Transfer: 3: Bed > Chair or W/C: Mod A (lift or lower assist);3: Chair or W/C > Bed: Mod A (lift or lower assist);3: Supine >  Sit: Mod A (lifting assist/Pt. 50-74%/lift 2 legs;3: Sit > Supine: Mod A (lifting assist/Pt. 50-74%/lift 2 legs)  FIM - Locomotion: Wheelchair Distance: 25' Locomotion: Wheelchair: 2: Travels 50 - 149 ft with maximal  assistance (Pt: 25 - 49%) FIM - Locomotion: Ambulation Locomotion: Ambulation Assistive Devices: Walker - Rolling Ambulation/Gait Assistance: 3: Mod assist Locomotion: Ambulation: 2: Travels 50 - 149 ft with minimal assistance (Pt.>75%)  Comprehension Comprehension Mode: Auditory Comprehension: 5-Follows basic conversation/direction: With extra time/assistive device  Expression Expression Mode: Verbal Expression: 3-Expresses basic 50 - 74% of the time/requires cueing 25 - 50% of the time. Needs to repeat parts of sentences.  Social Interaction Social Interaction: 4-Interacts appropriately 75 - 89% of the time - Needs redirection for appropriate language or to initiate interaction.  Problem Solving Problem Solving: 3-Solves basic 50 - 74% of the time/requires cueing 25 - 49% of the time  Memory Memory: 3-Recognizes or recalls 50 - 74% of the time/requires cueing 25 - 49% of the time  Medical Problem List and Plan:  1. Left subdural hematoma after a fall. Status post craniotomy evacuation of hematoma 10/07/2013  2. DVT Prophylaxis/Anticoagulation: SCDs. Monitor for any signs of DVT  3. Pain Management: Ultram as needed. Monitor with increased mobility  4. Neuropsych: This patient is not capable of making decisions on his own behalf  5.Dysphagia.Dysphgia 3 nectar liquids.Follow up speech therapy.  6. Seizure prophylaxis. Keppra 500 mg twice a day.  7. Parkinson's disease. Continue Sinemet as directed.  .8 Diabetes mellitus with peripheral neuropathy. Latest hemoglobin A1c 8.3. Check blood sugars a.c. and at bedtime. Continue tradjenta 5 mg daily.   -improving control 9. Hypertension. Norvasc 10 mg daily, Lasix 20 mg daily. Monitor with increased mobility  10. Hyperlipidemia. Zocor  11. History of atrial fibrillation. No anticoagulants prior to admission. Cardiac rate controlled   -continue to replace potassium 12. Asthma. Singulair 10 mg each bedtime. No shortness of breath reported   13. BPH. Flomax 0.4 mg daily. 14. Hypothyroidism. Synthroid  15. Obstructive sleep apnea. Nasal cpap 16. Leukocytosis---recheck next week--no signs of infection at present--trending down 17. Dysphagia: mulitfactorial D3 thins per SLP  -appetite much improved      LOS (Days) 13 A FACE TO FACE EVALUATION WAS PERFORMED  Mirtha Jain T 10/26/2013 8:15 AM

## 2013-10-26 NOTE — Progress Notes (Signed)
Pt seen and found already wearing cpap with his tubing and nasal prongs from home.  Pt appears to be tolerating well.  Wife at bedside who said, "everything was ok."  Pt and wife were advised that RT is available all night should they need further assistance.

## 2013-10-26 NOTE — Progress Notes (Signed)
Patient ID: Joshua Glass, male   DOB: 07/16/1933, 80 y.o.   MRN: [id number] Social visit, mutual pt.  Wife assisting pt with shaving, noting pt started process but fatigued.  Joshua Glass is more alert than with my last visit, but remains slow to react and respond with very low endurance. Wife reports little change in past week, though appreciative of care by CIR. SNF planned, likely tomorrow if bed available. Scalp incisions healed.  Joshua Prime, RN, BSN

## 2013-10-26 NOTE — Progress Notes (Signed)
Social Work Patient ID: Joshua Glass, male   DOB: 20-Nov-1932, 80 y.o.   MRN: [id number]  Met with pt and wife following team conference.  Both aware that I am continuing to work on SNF bed and hope to have available bed at [place] [facility] in the next couple of days.  Reviewed gains pt has made but stressed continued need for daily tx at SNF.  Continue to follow.  Jillisa Harris, LCSW

## 2013-10-26 NOTE — Progress Notes (Signed)
Occupational Therapy Session Note  Patient Details  Name: Joshua Glass MRN: [id number] Date of Birth: [birth year]  Today's Date: 10/26/2013 Time: 3382-5053 Time Calculation (min): 54 min   Skilled Therapeutic Interventions/Progress Updates:    Pt went down to the OT gym and worked on standing task at the high/low table.  Initially began having pt work on bending down and picking checkers up from on top of a rolling stool and placing them on a slanted Nature conservation officer.  Pt needing increased time secondary to pausing in between intervals.  He was able to tolerate standing for 5-6 mins before needing rest break.  Progressed to playing checker game in standing but as session progressed, freezing episodes increased and lengthened from a few seconds to 1 minute.  Eventually task was stopped and pt was allowed to sit for a couple of minutes.  Stood again and had pt use ROM arc to move rings from one side to the other to promote larger AROM movements in the UEs.  Pt was able to complete 4-5 before freezing and then returned to sitting.  Pt finished session by having pt perform 10 repetitions of shoulder flexion/abduction AAROM exercises to increase posture and increased cervical extension as pt sits with rounded shoulders and flexed trunk.  Pt transferred back to the room and to the bed with mod assist to conclude session.  Pt's wife reporting that his freezing has increased today compared to yesterday.    Therapy Documentation Precautions:  Precautions Precautions: Fall Precaution Comments: hx of multiple falls due to Parkinsons, HOH Restrictions Weight Bearing Restrictions: No  Pain: Pain Assessment Pain Assessment: No/denies pain Pain Score: 4  Pain Type: Acute pain Pain Location: Head Pain Orientation: Left Pain Descriptors / Indicators: Headache Pain Frequency: Intermittent Pain Onset: Gradual Patients Stated Pain Goal: 2 Pain Intervention(s): Medication (See eMAR);Ambulation/increased  activity ADL: ADL ADL Comments: see FIM  See FIM for current functional status  Therapy/Group: Individual Therapy  Yanina Knupp OTR/L 10/26/2013, 4:02 PM

## 2013-10-26 NOTE — Progress Notes (Signed)
Physical Therapy Session Note  Patient Details  Name: Joshua Glass MRN: [id number] Date of Birth: 09/17/1932  Today's Date: 10/26/2013 Time: 9024-0973 Time Calculation (min): 42 min  Short Term Goals: Week 2:  PT Short Term Goal 1 (Week 2): = downgraded, unmet LTGs of overall mod A  Skilled Therapeutic Interventions/Progress Updates:  1:1. Pt received sitting in w/c, verbalized not feeling well today. Pt unable to further elaborate as to what does not feel well. Assessment of vitals (WNL-see details below) and RN present to assess blood sugar. Denied HA, however, surgical site appeared more taut that normal. Pt agreeable to participate as able. Pt VERY slow today as well as req increased need for physical assistance overall. Practice functional transfers in therapy apartment. Practiced multiple t/f sit<>stand w/ emphasis on rocking for momentum and counting for initiation, req mod A overall. Pt demonstrated prolonged bout of freezing x71min while attempting to sit. Practiced t/f sup<>sit EOB w/ max A. Pt's wife verbalized very concerned/overwhelmed regarding pt's presentation stating, "I just don't know why he does this, he has just gotten so slow." Education provided regarding pt's mobility related symptoms of Parkinson's disease and how Brain Injury can impact pt's presentation. Pt demonstrated bout of coughing after taking sip of water at end of session despite cues for small sips and throat clearance, RN and SLP made aware. Pt sitting in w/c at end of session w/ all needs in reach, quick release belt in place and wife in room.   Therapy Documentation Precautions:  Precautions Precautions: Fall Precaution Comments: hx of multiple falls due to Parkinsons, HOH Restrictions Weight Bearing Restrictions: No General: Amount of Missed PT Time (min): 15 Minutes Missed Time Reason: Patient ill (comment);Nursing care Vital Signs: Therapy Vitals Temp: 98.1 F (36.7 C) Temp src: Oral Pulse Rate:  59 BP: 160/75 mmHg Patient Position, if appropriate: Sitting Oxygen Therapy SpO2: 97 % O2 Device: None (Room air)  See FIM for current functional status  Therapy/Group: Individual Therapy  [name] 10/26/2013, 11:29 AM

## 2013-10-26 NOTE — Progress Notes (Signed)
Speech Language Pathology Daily Session Note  Patient Details  Name: Joshua Glass MRN: [id number] Date of Birth: 11/25/1932  Today's Date: 10/26/2013 Time: 8242-3536 Time Calculation (min): 45 min  Short Term Goals: Week 2: SLP Short Term Goal 1 (Week 2): Patient will utilize speech intelligibility strategies with Min multimodal cues at the word level. SLP Short Term Goal 2 (Week 2): Patient will initiate verbal expression with Min multimodal cues.  SLP Short Term Goal 3 (Week 2): Patient will complete basic problem solving activities with Min multimodal cues.  SLP Short Term Goal 4 (Week 2): Patient will attend to functional task for 15 minutes with Mod verbal cues. SLP Short Term Goal 5 (Week 2): Patient will utilize external aids to assist in recall of new and daily information with Mod multimodal cues. SLP Short Term Goal 6 (Week 2): Patient will consume current diet without overt s/s of aspiration with Min verbal cues to utilize safe swallow strategies.  Skilled Therapeutic Interventions: Skilled therapeutic intervention addressed dysphagia goals. Student provided Min multimodal cues to take small bites/sips however; patient required Max multimodal cues to utilize safe swallow strategy of throat clear while consuming breakfast tray of Dys. 3 textures and thin liquids via cup. Patient required Min verbal cues to sustain attention and Max multimodal cues to utilize speech intelligibility strategies at the sentence level during session. Continue with current plan of care.   FIM:  Comprehension Comprehension Mode: Auditory Comprehension: 5-Follows basic conversation/direction: With extra time/assistive device Expression Expression Mode: Verbal Expression: 2-Expresses basic 25 - 49% of the time/requires cueing 50 - 75% of the time. Uses single words/gestures. Social Interaction Social Interaction: 4-Interacts appropriately 75 - 89% of the time - Needs redirection for appropriate language  or to initiate interaction. Problem Solving Problem Solving: 2-Solves basic 25 - 49% of the time - needs direction more than half the time to initiate, plan or complete simple activities Memory Memory: 3-Recognizes or recalls 50 - 74% of the time/requires cueing 25 - 49% of the time FIM - Eating Eating Activity: 5: Needs verbal cues/supervision;5: Set-up assist for cut food  Pain Pain Assessment Pain Assessment: No/denies pain  Therapy/Group: Individual Therapy  Joshua Glass 10/26/2013, 10:40 AM

## 2013-10-26 NOTE — Progress Notes (Signed)
Occupational Therapy Session Note  Patient Details  Name: Joshua Glass MRN: [id number] Date of Birth: [birth year]  Today's Date: 10/26/2013 Time: 0900-1000 Time Calculation (min): 60 min  Short Term Goals: Week 2:  OT Short Term Goal 1 (Week 2): STG=LTG secondary to LOS   Skilled Therapeutic Interventions/Progress Updates:    Pt engaged in bathing at shower level and dressing with sit<>stand from w/c at sink to address task initiation, attention to task, transfers, sit<>stand, standing balance, activity tolerance, and safety awareness.  Pt required max verbal cues this morning to initiate tasks and frequently would "freeze" during a task and require max multimodal cues to reinitiate task.  Pt required min/mod a for sit<>stand and increased assistance with all dressing tasks.    Therapy Documentation Precautions:  Precautions Precautions: Fall Precaution Comments: hx of multiple falls due to Parkinsons, HOH Restrictions Weight Bearing Restrictions: No Pain: Pain Assessment Pain Assessment: No/denies pain  See FIM for current functional status  Therapy/Group: Individual Therapy  Leroy Libman 10/26/2013, 10:03 AM

## 2013-10-26 NOTE — Progress Notes (Signed)
Physical Therapy Session Note  Patient Details  Name: Joshua Glass MRN: [id number] Date of Birth: [birth year]  Today's Date: 10/26/2013 Time: 1300-1350 Time Calculation (min): 50 min  Short Term Goals: Week 2:  PT Short Term Goal 1 (Week 2): = downgraded, unmet LTGs of overall mod A  Skilled Therapeutic Interventions/Progress Updates:   Pt received sitting in w/c with quick release belt donned finishing lunch with assist of wife and RN present for medication administration secondary to headache (see details below). Therapy with focus on initiation and sequencing with ambulation, dynamic standing balance, and anterior weightshift with functional transfers. Pt transported to gym in w/c and performed gait training 90 ft x 2 using RW with min A and max verbal cues for obstacle negotiation in crowded gym environment. Pt required seated rest between bouts of ambulation. Pt attempted to perform standing squats using RW for UE support but unable to achieve hip or knee flexion, with trunk flexion only. Transitioned to retrieving 4 cones from floor x 2 with seated rest between and L HHA with min-modA to maintain dynamic standing balance, with 50% verbal cues for sequencing and initiation and 75% verbal cues for safe technique (getting close enough to cone and widening BOS). Pt performed all functional transfers with min A overall. Pt returned to room seated in w/c with quick release belt donned and wife present.   Therapy Documentation Precautions:  Precautions Precautions: Fall Precaution Comments: hx of multiple falls due to Parkinsons, HOH Restrictions Weight Bearing Restrictions: No Vital Signs: Therapy Vitals Temp: 98.1 F (36.7 C) Temp src: Oral Pulse Rate: 59 BP: 160/75 mmHg Patient Position, if appropriate: Sitting Oxygen Therapy SpO2: 97 % O2 Device: None (Room air) Pain: Pain Assessment Pain Assessment: No/denies pain Pain Score: 4  Pain Type: Acute pain Pain Location: Head Pain  Orientation: Left Pain Descriptors / Indicators: Headache Pain Frequency: Intermittent Pain Onset: Gradual Patients Stated Pain Goal: 2 Pain Intervention(s): Medication (See eMAR);Ambulation/increased activity Locomotion : Ambulation Ambulation/Gait Assistance: 4: Min assist   See FIM for current functional status  Therapy/Group: Individual Therapy  Laretta Alstrom 10/26/2013, 1:57 PM

## 2013-10-27 ENCOUNTER — Encounter (HOSPITAL_COMMUNITY): Payer: Medicare Other | Admitting: Occupational Therapy

## 2013-10-27 ENCOUNTER — Inpatient Hospital Stay (HOSPITAL_COMMUNITY): Payer: Medicare Other | Admitting: Speech Pathology

## 2013-10-27 ENCOUNTER — Inpatient Hospital Stay (HOSPITAL_COMMUNITY): Payer: Medicare Other

## 2013-10-27 ENCOUNTER — Inpatient Hospital Stay (HOSPITAL_COMMUNITY): Payer: Medicare Other | Admitting: Occupational Therapy

## 2013-10-27 ENCOUNTER — Encounter (HOSPITAL_COMMUNITY): Payer: Medicare Other

## 2013-10-27 ENCOUNTER — Inpatient Hospital Stay (HOSPITAL_COMMUNITY): Payer: Medicare Other | Admitting: Physical Therapy

## 2013-10-27 DIAGNOSIS — E1142 Type 2 diabetes mellitus with diabetic polyneuropathy: Secondary | ICD-10-CM

## 2013-10-27 DIAGNOSIS — G40919 Epilepsy, unspecified, intractable, without status epilepticus: Secondary | ICD-10-CM

## 2013-10-27 DIAGNOSIS — G20 Parkinson's disease: Secondary | ICD-10-CM

## 2013-10-27 DIAGNOSIS — R1310 Dysphagia, unspecified: Secondary | ICD-10-CM

## 2013-10-27 DIAGNOSIS — E1149 Type 2 diabetes mellitus with other diabetic neurological complication: Secondary | ICD-10-CM

## 2013-10-27 DIAGNOSIS — S065XAA Traumatic subdural hemorrhage with loss of consciousness status unknown, initial encounter: Secondary | ICD-10-CM

## 2013-10-27 DIAGNOSIS — S065X9A Traumatic subdural hemorrhage with loss of consciousness of unspecified duration, initial encounter: Secondary | ICD-10-CM

## 2013-10-27 DIAGNOSIS — I10 Essential (primary) hypertension: Secondary | ICD-10-CM

## 2013-10-27 LAB — GLUCOSE, CAPILLARY
Glucose-Capillary: 116 mg/dL — ABNORMAL HIGH (ref 70–99)
Glucose-Capillary: 129 mg/dL — ABNORMAL HIGH (ref 70–99)
Glucose-Capillary: 118 mg/dL — ABNORMAL HIGH (ref 70–99)
Glucose-Capillary: 142 mg/dL — ABNORMAL HIGH (ref 70–99)
Glucose-Capillary: 170 mg/dL — ABNORMAL HIGH (ref 70–99)

## 2013-10-27 MED ORDER — LISINOPRIL 5 MG PO TABS
5.0000 mg | ORAL_TABLET | Freq: Every day | ORAL | Status: DC
  Administered 2013-10-27 – 2013-10-28 (×2): 5 mg via ORAL
  Filled 2013-10-27 (×3): qty 1

## 2013-10-27 MED ORDER — MEGESTROL ACETATE 400 MG/10ML PO SUSP
400.0000 mg | Freq: Every day | ORAL | Status: DC
  Administered 2013-10-27: 400 mg via ORAL
  Filled 2013-10-27 (×3): qty 10

## 2013-10-27 NOTE — Progress Notes (Signed)
The skilled treatment note has been reviewed and SLP is in agreement.  Ellar Hakala, M.A., CCC-SLP  [phone]   

## 2013-10-27 NOTE — Progress Notes (Signed)
Occupational Therapy Session Note  Patient Details  Name: Joshua Glass MRN: [id number] Date of Birth: April 16, 1933  Today's Date: 10/27/2013 Time: 0800-0900 Time Calculation (min): 60 min  Short Term Goals: Week 2:  OT Short Term Goal 1 (Week 2): STG=LTG secondary to LOS   Skilled Therapeutic Interventions/Progress Updates:    Pt eating breakfast in bed upon arrival.  Pt engaged in bathing at shower level and dressing with sit<>stand from w/c to address activity tolerance, task initiation, sequencing, standing balance, and safety awareness.  Pt required min A to initiate sit<>stand and transfers but was able to complete tasks with steady A.  Pt able to stand in shower with supervision while using grab bars. Pt continues to require extra time to initiate and complete tasks.    Therapy Documentation Precautions:  Precautions Precautions: Fall Precaution Comments: hx of multiple falls due to Parkinsons, HOH Restrictions Weight Bearing Restrictions: No Pain: Pain Assessment Pain Assessment: No/denies pain  See FIM for current functional status  Therapy/Group: Individual Therapy  Leroy Libman 10/27/2013, 9:04 AM

## 2013-10-27 NOTE — Progress Notes (Signed)
Therapy reported to this RN that patient had orthostatic episode during afternoon session. At 1334 while sitting BP 162/78, HR 79. At 1335 while standing BP 137/66, HR 83. Patient reported to therapy feeling light headed. At 1404 while sitting BP 166/75, HR 75. Patient resting comfortably in wheelchair with no c/o lightheadedness or dizziness. Will continue to monitor.

## 2013-10-27 NOTE — Progress Notes (Signed)
[facility] PHYSICAL MEDICINE & REHABILITATION     PROGRESS NOTE    Subjective/Complaints: Slept much better last night. Feels well this am. Wants to know [facility]'ll [facility] to [facility]" A 12 point review of systems has been performed and if not noted above is otherwise negative.   Objective: Vital Signs: Blood pressure 177/84, pulse 71, temperature 98.2 F (36.8 C), temperature source Oral, resp. rate 20, height 5\' 10"  (1.778 m), weight 98.884 kg (218 lb), SpO2 93.00%. Dg Chest 2 View  10/26/2013   CLINICAL DATA:  Coughing  EXAM: CHEST  2 VIEW  COMPARISON:  Chest x-ray of 10/09/2013  FINDINGS: Aeration has improved slightly. No infiltrate or effusion is seen. Cardiomegaly is stable. No bony abnormality is noted.  IMPRESSION: Slightly better aeration.  No active lung disease.   Electronically Signed   By: Ivar Drape M.D.   On: 10/26/2013 08:07   No results found for this basename: WBC, HGB, HCT, PLT,  in the last 72 hours No results found for this basename: NA, K, CL, CO, GLUCOSE, BUN, CREATININE, CALCIUM,  in the last 72 hours CBG (last 3)   Recent Labs  10/26/13 2042 10/27/13 0704 10/27/13 0718  GLUCAP 166* 129* 116*    Wt Readings from Last 3 Encounters:  10/20/13 98.884 kg (218 lb)  10/09/13 105 kg (231 lb 7.7 oz)  10/09/13 105 kg (231 lb 7.7 oz)    Physical Exam:  Eyes: EOM are normal.  Neck: Normal range of motion. Neck supple. No thyromegaly present.  Cardiovascular:  Cardiac rate controlled. Reg rhythm  Respiratory: Effort normal and breath sounds normal. No respiratory distress.  GI: Soft. Bowel sounds are normal. He exhibits no distension. Non-tender Neurological:  Quite alert. Delayed processing.  Flat facies. He was able to provide his age place as well as date of birth.  His speech was a bit dysarthric but intelligible. Slow to process. Rigidity in all 4 limbs. RUE is 3- to 3/5 deltoid, bicep, tricep, HI. LUE is 1+ deltoid/shoulder (RTC), 3-bicep, tricep, 3 hand.Marland Kitchen  RLE is3-/5 prox HF, KE, to 2-distally at ankle with ADF/APF---inconsistent (but better) initiating movement. LLE 3- HF, 3 KE, ankle. Senses pain throughout on right.  Skin:  Craniotomy site clean, staples out--healed Psych:   Affect more dynamic.    Assessment/Plan: 1. Functional deficits secondary to left SDH after fall which require 3+ hours per day of interdisciplinary therapy in a comprehensive inpatient rehab setting. Physiatrist is providing close team supervision and 24 hour management of active medical problems listed below. Physiatrist and rehab team continue to assess barriers to discharge/monitor patient progress toward functional and medical goals.    FIM: FIM - Bathing Bathing Steps Patient Completed: Chest;Right Arm;Left Arm;Abdomen;Front perineal area;Right upper leg;Left upper leg Bathing: 3: Mod-Patient completes 5-7 93f 10 parts or 50-74%  FIM - Upper Body Dressing/Undressing Upper body dressing/undressing steps patient completed: Pull shirt over trunk;Thread/unthread left sleeve of pullover shirt/dress Upper body dressing/undressing: 3: Mod-Patient completed 50-74% of tasks FIM - Lower Body Dressing/Undressing Lower body dressing/undressing steps patient completed: Thread/unthread left pants leg;Don/Doff left sock;Don/Doff left shoe Lower body dressing/undressing: 2: Max-Patient completed 25-49% of tasks  FIM - Toileting Toileting: 0: Activity did not occur  FIM - Radio producer Devices: Grab bars;Elevated toilet seat Toilet Transfers: 4-To toilet/BSC: Min A (steadying Pt. > 75%);4-From toilet/BSC: Min A (steadying Pt. > 75%)  FIM - Control and instrumentation engineer Devices: Walker;Arm rests Bed/Chair Transfer: 2: Sit > Supine: Max  A (lifting assist/Pt. 25-49%)  FIM - Locomotion: Wheelchair Distance: 25' Locomotion: Wheelchair: 1: Total Assistance/staff pushes wheelchair (Pt<25%) FIM - Locomotion:  Ambulation Locomotion: Ambulation Assistive Devices: Administrator Ambulation/Gait Assistance: 4: Min assist Locomotion: Ambulation: 2: Travels 50 - 149 ft with minimal assistance (Pt.>75%)  Comprehension Comprehension Mode: Auditory Comprehension: 5-Follows basic conversation/direction: With extra time/assistive device  Expression Expression Mode: Verbal Expression: 2-Expresses basic 25 - 49% of the time/requires cueing 50 - 75% of the time. Uses single words/gestures.  Social Interaction Social Interaction: 4-Interacts appropriately 75 - 89% of the time - Needs redirection for appropriate language or to initiate interaction.  Problem Solving Problem Solving: 2-Solves basic 25 - 49% of the time - needs direction more than half the time to initiate, plan or complete simple activities  Memory Memory: 3-Recognizes or recalls 50 - 74% of the time/requires cueing 25 - 49% of the time  Medical Problem List and Plan:  1. Left subdural hematoma after a fall. Status post craniotomy evacuation of hematoma 10/07/2013  2. DVT Prophylaxis/Anticoagulation: SCDs. Monitor for any signs of DVT  3. Pain Management: Ultram as needed. Monitor with increased mobility  4. Neuropsych: This patient is not capable of making decisions on his own behalf  5.Dysphagia.Dysphgia 3 nectar liquids.Follow up speech therapy.  6. Seizure prophylaxis. Keppra 500 mg twice a day.  7. Parkinson's disease. Continue Sinemet as directed.  .8 Diabetes mellitus with peripheral neuropathy. Latest hemoglobin A1c 8.3. Check blood sugars a.c. and at bedtime. Continue tradjenta 5 mg daily.   -improved control 9. Hypertension. Norvasc 10 mg daily, Lasix 20 mg daily. Will add low dose ace for better control 10. Hyperlipidemia. Zocor  11. History of atrial fibrillation. No anticoagulants prior to admission. Cardiac rate controlled   -continue to replace potassium 12. Asthma. Singulair 10 mg each bedtime. No shortness of breath  reported  13. BPH. Flomax 0.4 mg daily. 14. Hypothyroidism. Synthroid  15. Obstructive sleep apnea. Nasal cpap 16. Leukocytosis---recheck next week--no signs of infection at present--trending down 17. Dysphagia: mulitfactorial D3 thins per SLP  -appetite much improved      LOS (Days) 14 A FACE TO FACE EVALUATION WAS PERFORMED  Meredith Staggers 10/27/2013 8:14 AM

## 2013-10-27 NOTE — Progress Notes (Signed)
Physical Therapy Note  Patient Details  Name: Joshua Glass MRN: [id number] Date of Birth: 06/23/1933 Today's Date: 10/27/2013 1435-1510 35 min individual therapy No pain reported  Pt initiated taking a sip of water before leaving room.  He required 1 minute to clear throat given multiple cues.  Pt did clear throat after quick release belt was removed from chair and he was flexed forward by therapist; instructed wife that leaning forward may be helpful when he is slow to clear throat.  neuromuscular re-education in sitting via  Forced use, demo for catching and tossing a beach ball with bil hands, and kicking beach ball R and L; and bil hand task in sitting, putting lids on Rx bottles, crossing midline with R hand and placing bottles to L with L hand., x 6.  Wt shifting for scooting forward in w/c with max assist.  Gait in hallway using railing on R, x 10' with min assist, transitioning to RW, x 60' with 2 turns L, with min assist on straight path, with max assist for turns due to "freezing".  Frederic Jericho 10/27/2013, 2:41 PM

## 2013-10-27 NOTE — Discharge Summary (Signed)
  Discharge summary job # [id number]

## 2013-10-27 NOTE — Progress Notes (Signed)
Pt's wife helps to put pt CPAP on. RT will monitor.

## 2013-10-27 NOTE — Progress Notes (Signed)
Speech Language Pathology Daily Session Note  Patient Details  Name: Joshua Glass MRN: [id number] Date of Birth: July 21, 1933  Today's Date: 10/27/2013 Time: 1100-1145 Time Calculation (min): 45 min  Short Term Goals: Week 2: SLP Short Term Goal 1 (Week 2): Patient will utilize speech intelligibility strategies with Min multimodal cues at the word level. SLP Short Term Goal 2 (Week 2): Patient will initiate verbal expression with Min multimodal cues.  SLP Short Term Goal 3 (Week 2): Patient will complete basic problem solving activities with Min multimodal cues.  SLP Short Term Goal 4 (Week 2): Patient will attend to functional task for 15 minutes with Mod verbal cues. SLP Short Term Goal 5 (Week 2): Patient will utilize external aids to assist in recall of new and daily information with Mod multimodal cues. SLP Short Term Goal 6 (Week 2): Patient will consume current diet without overt s/s of aspiration with Min verbal cues to utilize safe swallow strategies.  Skilled Therapeutic Interventions: Skilled therapeutic intervention addressed cognitive goals. Student provided Mod multimodal cues for problem solving and organization to complete new learning task with a card game. Patient self-corrected x1 during session. Patient also required Min verbal cues for initiation and basic problem solving with basic mathematical task.  Patient with increased coughing without POs during session and required Max cues to clear throat intermittently. Continue with current plan of care.    FIM:  Comprehension Comprehension Mode: Auditory Comprehension: 5-Follows basic conversation/direction: With extra time/assistive device Expression Expression Mode: Verbal Expression: 3-Expresses basic 50 - 74% of the time/requires cueing 25 - 50% of the time. Needs to repeat parts of sentences. Social Interaction Social Interaction: 4-Interacts appropriately 75 - 89% of the time - Needs redirection for appropriate language  or to initiate interaction. Problem Solving Problem Solving: 3-Solves basic 50 - 74% of the time/requires cueing 25 - 49% of the time Memory Memory: 3-Recognizes or recalls 50 - 74% of the time/requires cueing 25 - 49% of the time FIM - Eating Eating Activity: 0: Activity did not occur  Pain Pain Assessment Pain Assessment: No/denies pain  Therapy/Group: Individual Therapy  Jalene Mullet 10/27/2013, 12:48 PM

## 2013-10-27 NOTE — Progress Notes (Signed)
Physical Therapy Weekly Progress Note  Patient Details  Name: Joshua Glass MRN: [id number] Date of Birth: 11-17-1932  Beginning of progress report period: October 20, 2013 End of progress report period: October 27, 2013  Today's Date: 10/27/2013 Time: 0915-1020 Time Calculation (min): 65 min  Patient has met 5 of 11 long term goals.  Patient goals were downgraded last week to overall mod A and have since been upgraded back to overall min A due to improved initiation, balance and decreased need for physical assist. Patient and wife currently awaiting SNF placement due to caregiver (wife) being unable to provide pt current level of care/assist needed.  Patient continues to demonstrate the following deficits: decreased initiation, decreased attention, muscle weakness, decreased functional endurance, decreased coordination and decreased motor planning, decreased dynamic standing balance, decreased postural control and decreased balance strategies and therefore will continue to benefit from skilled PT intervention to enhance overall performance with activity tolerance, balance, postural control, attention, awareness and coordination.  Patient progressing toward long term goals. Continue plan of care.  PT Short Term Goals Week 2:  PT Short Term Goal 1 (Week 2):  (goals downgraded to mod A then upgraded to min A due to pt progress) PT Short Term Goal 1 - Progress (Week 2): Updated due to goal met Week 3:  PT Short Term Goal 1 (Week 3): = LTGs of overall min A  Skilled Therapeutic Interventions/Progress Updates:   Pt received sitting in w/c at RN station. Pt appears more alert and fluid with conversation this session. Session with focus on initiation and sequencing with transfers, ambulation, stair negotiation, dynamic standing balance, and anterior weightshift with functional transfers. Pt ambulated 30' x 2 using RW and min A for decreased anterior lean and obstacle negotiation/turns gym <> ADL apartment.  In ADL apt, pt practiced functional transfers from edge of bed with min A and low plush furniture with max A using RW with verbal/tactile cues for use of momentum and counting for initiation. Pt performed functional transfers from w/c with overall min tactile cues for initiation of forward weight shift. In gym, pt retrieved cones from floor and placed in basketball goal with L HHA with min-modA to maintain dynamic standing balance, with max verbal cues for safety to regain balance before attempting to place cones in goal and 25% verbal cues for sequencing and technique. Pt negotiated up/down three 5.5" stairs using 2 rails and CGA with verbal cues to attend to L hand. When negotiating same stairs sideways with only L rail, pt able to ascend stairs with min A and max verbal cues for sequencing but required max to total assist to descend stairs with BUEs on L rail, with pt experiencing prolonged freezing episode with posterior lean and demo unsafe technique stepping on his other foot and attempting to cross legs over one another. Therapist required to facilitate lateral weightshift to unweight each LE and place foot in correct and safe position on each step. Pt reported "freezing" on stairs because he was afraid of falling. Pt left sitting in w/c with quick release belt donned at RN station with RN for medication administration.   Therapy Documentation Precautions:  Precautions Precautions: Fall Precaution Comments: hx of multiple falls due to Parkinsons, HOH Restrictions Weight Bearing Restrictions: No Vital Signs: Therapy Vitals Pulse Rate: 61 BP: 148/71 mmHg Patient Position, if appropriate: Sitting Pain: Pain Assessment Pain Assessment: No/denies pain  See FIM for current functional status  Therapy/Group: Individual Therapy  Laretta Alstrom 10/27/2013, 12:13 PM

## 2013-10-27 NOTE — Progress Notes (Signed)
Physical Therapy Session Note  Patient Details  Name: Joshua Glass MRN: [id number] Date of Birth: Oct 26, 1932  Today's Date: 10/27/2013 Time: 0370-4888 Time Calculation (min): 45 min  Short Term Goals: Week 3:  PT Short Term Goal 1 (Week 3): = LTGs of overall min A  Skilled Therapeutic Interventions/Progress Updates:  1:1. Pt received sitting in w/c, ready for therapy. Focus this session on sit<>stand transfers and ambulation outside. Pt practiced t/f sit<>stand x4 reps for emphasis on bending knees to be able to reach back w/ B UE so as to better achieve a more soft landing. Pt often stating, "I'm scared" in relating to bending his knees more due to fear of falling. Pt amb 30' and 75' w/ RW req overall min A until last 5' of second bout w/ pt req mod A due to inability to correct progressive anterior lean. Pt appearing w/ a blank stare and quickly sat down in w/c directly behind him- not attempting to place hands back. Denied feeling dizzy/lightheaded. Pt brought back inside to assess orthostatic BP. Sitting recorded as 162/78 and standing recorded as 137/66. Pt sitting in w/c at end of session w/ all needs in reach, bed alarm on.   Therapy Documentation Precautions:  Precautions Precautions: Fall Precaution Comments: hx of multiple falls due to Parkinsons, HOH Restrictions Weight Bearing Restrictions: No  See FIM for current functional status  Therapy/Group: Individual Therapy  [name] 10/27/2013, 4:20 PM

## 2013-10-27 NOTE — Discharge Summary (Signed)
Joshua Glass, Joshua Glass                 ACCOUNT NO.:  [id number]  MEDICAL RECORD NO.:  [id number]  LOCATION:  4W13C                        FACILITY:  [facility]  PHYSICIAN:  Meredith Staggers, M.D.DATE OF BIRTH:  Dec 08, 1932  DATE OF ADMISSION:  10/13/2013 DATE OF DISCHARGE:  10/28/2013                              DISCHARGE SUMMARY   DISCHARGE DIAGNOSES: 1. Left subdural hematoma after a fall, status post craniotomy,     evacuation of hematoma on October 07, 2013. 2. Sequential compression devices for DVT prophylaxis and pain     management. 3. Dysphagia. 4. Seizure prophylaxis. 5. Parkinson disease. 6. Diabetes mellitus. 7. Peripheral neuropathy. 8. Hypertension. 9. Hyperlipidemia. 10.History of atrial fibrillation. 11.Asthma. 12.Benign prostatic hypertrophy. 13.Hypothyroidism. 14.Obstructive sleep apnea.  HISTORY OF PRESENT ILLNESS:  This is a 80 year old right-handed male with history of Parkinson disease and atrial fibrillation, not on anticoagulation, well known to rehab services from left subdural hemorrhage in March 2013, after being found unresponsive with conservative care provided.  He was discharged to home from rehab services, modified independent, using a walker, presented on October 07, 2013 with recurrent episodes of aphasia and slurred speech, reports of 2 falls in the past week.  CT and imaging revealed acute left subdural hematoma.  Underwent craniotomy, evacuation of hematoma on October 07, 2013 per Dr. Vertell Limber.  Remained on Sinemet for history of Parkinson disease.  Keppra for seizure prophylaxis.  Dysphagia 3, nectar thick liquids.  Physical and occupational therapy is ongoing.  Patient was admitted for a comprehensive rehab program.  PAST MEDICAL HISTORY:  See discharge diagnoses.  SOCIAL HISTORY:  Lives with spouse.  FUNCTIONAL HISTORY:  Prior to admission, used a walker, sedentary.  FUNCTIONAL STATUS:  Upon admission to rehab services was moderate to max assist  for mobility, requiring max assist for speed, verbal, and tactile cues.  Total assist for activities of daily living.  PHYSICAL EXAMINATION:  VITAL SIGNS:  Blood pressure 153/62, pulse 81, temperature 99, and respirations 22. GENERAL:  This was an alert male, sitting up in chair, made good eye contact with examiner.  Affect was flat.  He was able to provide his age, place, and date of birth, follows simple commands.  His speech was dysarthric, but intelligible.  Pupils round and reactive to light without nystagmus. LUNGS:  Clear to auscultation. CARDIAC:  Rate controlled. ABDOMEN:  Soft and nontender.  Good bowel sounds. NEUROLOGIC:  Craniotomy site with staples intact without drainage.  REHABILITATION HOSPITAL COURSE:  Patient was admitted to inpatient rehab services with therapies initiated on a 3-hour daily basis consisting of physical therapy, occupational therapy, speech therapy, and rehabilitation nursing.  The following issues were addressed during patient's rehabilitation stay.  Pertaining to Mr. Dercole's left subdural hematoma after a fall, he had undergone craniotomy, evacuation of hematoma on October 07, 2013.  Staples removed.  Surgical site healing nicely.  He would follow up with Dr. Dierdre Harness.  Sequential compression devices in place for DVT prophylaxis.  His diet was slowly advanced to a mechanical soft with thin liquids.  Monitor for any signs of aspiration. He continued on lisinopril for blood pressure control with no orthostatic changes.  He remained on  Sinemet as advised for history of Parkinson disease.  Synthroid for hypothyroidism.  His blood sugars remained well controlled on Tradjenta 5 mg daily.  He had a hemoglobin A1c of 8.3.  He did have a history of atrial fibrillation with rate control.  No anticoagulations prior to admission as well as also risk of falls.  The patient received weekly collaborative interdisciplinary team conferences to discuss estimated  length of stay, family teaching, and any barriers to discharge.  He was ambulating 75 feet with a rolling walker, requiring overall minimal assist, monitoring for any signs of fatigue.  The patient was able to stand for 3 or 4 minutes before needing to sit down and rest.  The patient needed moderate demonstrating cueing to initiate and to complete sitting.  The patient did show slow, but progressive gains.  There was a feelings of his family that they could not provide the necessary assistance at home, thus recommendations were made for skilled nursing facility.  Bed becoming available on October 28, 2013.  DISCHARGE MEDICATIONS:  Included allopurinol 300 mg p.o. daily, Norvasc 10 mg p.o. daily, aspirin 81 mg p.o. daily, B complex with C tablet 1 tablet p.o. daily, Sinemet 25/100 mg 1-1/2 tablets p.o. t.i.d. with meals, vitamin D 2000 units p.o. daily, Lasix 20 mg p.o. daily, Keppra 500 mg p.o. b.i.d., Synthroid 75 mcg p.o. daily before breakfast, Tradjenta 5 mg p.o. daily, lisinopril 5 mg p.o. daily, Singulair 10 mg p.o. at bedtime, Protonix 40 mg p.o. daily, potassium chloride 20 mEq p.o. b.i.d., Senokot tablets 1 p.o. b.i.d., Zocor 20 mg p.o. every evening, and Flomax 0.4 mg p.o. daily.  DIET:  Mechanical soft thin liquids.  SPECIAL INSTRUCTIONS:  The patient would follow up with Dr. Alger Simons at the outpatient rehab service office on December 22, 2013, arrival 10:20 a.m.  Call for appointment with Neurosurgery, Dr. Erline Levine at [phone].     Lauraine Rinne, P.A.   ______________________________ Meredith Staggers, M.D.    DA/MEDQ  D:  10/27/2013  T:  10/27/2013  Job:  [id number]  cc:   Marchia Meiers. Vertell Limber, M.D. Floyde Parkins, M.D.

## 2013-10-27 NOTE — Progress Notes (Signed)
Occupational Therapy Session Note  Patient Details  Name: Joshua Glass MRN: [id number] Date of Birth: Jan 27, 1933  Today's Date: 10/27/2013 Time: 5945-8592 Time Calculation (min): 30 min  Skilled Therapeutic Interventions/Progress Updates:    Pt worked on standing at the cabinets to place items in the upper cabinet with the LUE.  Emphasis on standing balance while using the RUE to place boxes of jello in the lower shelf of the lower cabinet.  Pt was able to stand for 3-4 mins before needing to sit down and rest.  While attempting to sit down pt with increased freezing.  Pt needed mod demonstrational cueing to initiate and to complete sitting.  On second attempt  Pt needed mod demonstrational cueing to initiate standing for second time.  Finished placing items in the cabinet and then returned to the room.  Pt transferred to the bed from the wheelchair with min assist and then to supine with max assist to bring LEs into the bed.    Therapy Documentation Precautions:  Precautions Precautions: Fall Precaution Comments: hx of multiple falls due to Parkinsons, HOH Restrictions Weight Bearing Restrictions: No  Pain: Pain Assessment Pain Assessment: No/denies pain  ADL: See FIM for current functional status  Therapy/Group: Individual Therapy  Cindra Presume OTR/L 10/27/2013, 3:51 PM

## 2013-10-28 ENCOUNTER — Inpatient Hospital Stay (HOSPITAL_COMMUNITY): Payer: Medicare Other | Admitting: Speech Pathology

## 2013-10-28 ENCOUNTER — Inpatient Hospital Stay (HOSPITAL_COMMUNITY): Payer: Medicare Other

## 2013-10-28 ENCOUNTER — Inpatient Hospital Stay (HOSPITAL_COMMUNITY): Payer: Medicare Other | Admitting: Physical Therapy

## 2013-10-28 ENCOUNTER — Encounter (HOSPITAL_COMMUNITY): Payer: Medicare Other

## 2013-10-28 LAB — GLUCOSE, CAPILLARY: GLUCOSE-CAPILLARY: 112 mg/dL — AB (ref 70–99)

## 2013-10-28 NOTE — Progress Notes (Signed)
Occupational Therapy Session Note  Patient Details  Name: Joshua Glass MRN: [id number] Date of Birth: Nov 20, 1932  Today's Date: 10/28/2013 Time: 0800-0900 Time Calculation (min): 60 min  Short Term Goals: Week 2:  OT Short Term Goal 1 (Week 2): STG=LTG secondary to LOS   Skilled Therapeutic Interventions/Progress Updates:    Pt engaged in BADL retraining to address task initiation, sequencing, sit<>stand from varying heights, bed mobility, activity tolerance, and safety awareness.  Pt continues to require extra time and max multimodal cues to initiate and complete tasks.  Pt continues to "freeze" when performing tasks and requires multimodal cues to reinitiate the task.    Therapy Documentation Precautions:  Precautions Precautions: Fall Precaution Comments: hx of multiple falls due to Parkinsons, HOH Restrictions Weight Bearing Restrictions: No Pain: Pain Assessment Pain Assessment: No/denies pain ADL: ADL ADL Comments: see FIM  See FIM for current functional status  Therapy/Group: Individual Therapy  Leroy Libman 10/28/2013, 9:06 AM

## 2013-10-28 NOTE — Progress Notes (Signed)
Pt discharged to [facility] skilled facility

## 2013-10-28 NOTE — Plan of Care (Signed)
Problem: RH Furniture Transfers Goal: LTG Patient will perform furniture transfers w/assist (OT/PT LTG: Patient will perform furniture transfers with assistance (OT/PT).  Outcome: Not Applicable Date Met:  60/15/61 N/A due to d/c to SNF  Problem: RH Stairs Goal: LTG Patient will ambulate up and down stairs w/assist (PT) LTG: Patient will ambulate up and down # of stairs with assistance (PT)  Outcome: Not Applicable Date Met:  53/79/43 N/A due to d/c to SNF

## 2013-10-28 NOTE — Progress Notes (Signed)
Speech Language Pathology Discharge Summary  Patient Details  Name: Joshua Glass MRN: [id number] Date of Birth: 1933-06-17  Today's Date: 10/28/2013  Patient has met 7 of 7 long term goals.  Patient to discharge at overall Min;Mod level.   Reasons goals not met: N/A   Clinical Impression/Discharge Summary: Pt has made functional gains and has met 7 of 7 LTG's this admission due to increased speech intelligibility, utilization of swallowing compensatory strategies, sustained attention, working memory, emergent awareness, problem solving and initiation of verbal expression and functional tasks. Currently, pt demonstrates behaviors consistent with a Rancho level VII and requires overall Min-Mod A for cognitive tasks depending on fatigue level. Pt is also consuming Dys. 3 textures with thin liquids with minimal overt s/s of aspiration and requires Mod A multimodal cues to utilize swallowing compensatory strategies of small bites/sips, slow pace and intermittent throat clear. Pt demonstrates increased verbal initiation with expression of wants/needs and requires Min-Mod A for utilization of speech intelligibility strategies at the phrase level. Pt's wife has been present throughout therapy sessions and education has been ongoing. Pt's wife is unable to provide the necessary physical and cognitive assistance at this time, therefore, pt will discharge to SNF to maximize cognitive-linguistic and swallowing function in order to maximize his functional independence and reduce caregiver burden.   Care Partner:  Caregiver Able to Provide Assistance: No  Type of Caregiver Assistance: Physical;Cognitive  Recommendation:  Skilled Nursing facility;24 hour supervision/assistance  Rationale for SLP Follow Up: Reduce caregiver burden;Maximize cognitive function and independence;Maximize functional communication;Maximize swallowing safety   Equipment: N/A   Reasons for discharge: Treatment goals met;Discharged from  hospital   Patient/Family Agrees with Progress Made and Goals Achieved: Yes   See FIM for current functional status  Buzzy Han 10/28/2013, 9:28 AM

## 2013-10-28 NOTE — Progress Notes (Signed)
Social Work  Discharge Note  The overall goal for the admission was met for:   Discharge location: No - plan changed to SNF as wife does not feel she can provide current level of care  Length of Stay: Yes - 15 days  Discharge activity level: Yes - min/ mod assist overall  Home/community participation: Yes  Services provided included: MD, RD, PT, OT, SLP, RN, TR, Pharmacy and SW  Financial Services: Medicare and Private Insurance: [place]  Follow-up services arranged: Other: SNF placement at [facility]  Comments (or additional information):  Patient/Family verbalized understanding of follow-up arrangements: Yes  Individual responsible for coordination of the follow-up plan: wife  Confirmed correct DME delivered: NA    [employer]

## 2013-10-28 NOTE — Progress Notes (Signed)
Occupational Therapy Discharge Summary  Patient Details  Name: Joshua Glass MRN: [id number] Date of Birth: 1933/02/19  Today's Date: 10/28/2013  Patient has met 7 of 8 long term goals due to improved balance and improved attention.  Pt made slow progress during this admission (LTGs downgraded) and discharged to SNF for continued rehab.  Pt continue to require extra time and max multimodal cues to initiate and complete functional tasks. Pt requires min A/mod A for BADLs except for max A for LB dressing.  Pt's ability to perform BADLs has been inconsistent throughout this admission. Pt's wife has been present on several occasions and determined that she would be unable to provided the level of assistance patient currently requires. Patient to discharge at overall Mod Assist level to SNF for continued skilled therapy.    Reasons goals not met: Pt currently needs max assist for LB dressing instead of mod assist.  Recommendation:  Patient will benefit from ongoing skilled OT services in skilled nursing facility setting to continue to advance functional skills in the area of BADL.  Joshua Glass continues to demonstrate limited balance and decreased overall independence with selfcare tasks.  He continues to demonstrate decreased problem solving as well as decreased initiation.    Equipment: No equipment providedDischarge to SNF  Reasons for discharge: discharge from hospital  Patient/family agrees with progress made and goals achieved: Yes  OT Discharge ADL ADL ADL Comments: see FIM Vision/Perception  Vision- History Baseline Vision/History: No visual deficits Patient Visual Report: No change from baseline Vision- Assessment Vision Assessment?: No apparent visual deficits Praxis Praxis-Other Comments: decreased ability to correct LOB  Cognition Overall Cognitive Status: Impaired/Different from baseline Arousal/Alertness: Lethargic Orientation Level: Oriented X4 Attention: Sustained Focused  Attention: Appears intact Sustained Attention: Impaired Sustained Attention Impairment: Verbal basic;Functional basic Memory: Impaired Memory Impairment: Decreased recall of new information;Decreased short term memory Decreased Short Term Memory: Verbal basic;Functional basic Awareness: Impaired Awareness Impairment: Emergent impairment Problem Solving: Impaired Problem Solving Impairment: Verbal basic;Functional basic Executive Function: Initiating;Organizing;Sequencing;Self Monitoring;Self Correcting Sequencing: Impaired Sequencing Impairment: Verbal basic;Functional basic Organizing: Impaired Organizing Impairment: Verbal basic;Functional basic Initiating: Impaired Initiating Impairment: Verbal basic;Functional basic Self Monitoring: Impaired Self Monitoring Impairment: Verbal basic;Functional basic Self Correcting: Impaired Self Correcting Impairment: Verbal basic;Functional basic Safety/Judgment: Impaired [place] [employer] Scales of Cognitive Functioning: Automatic/appropriate Sensation Sensation Light Touch: Impaired Detail Light Touch Impaired Details: Impaired RUE;Impaired LUE;Impaired RLE;Impaired LLE Hot/Cold: Appears Intact Proprioception: Impaired by gross assessment Additional Comments: Decreased sensation in B hands/feet due to neuropathy at baseline per wife's report Coordination Gross Motor Movements are Fluid and Coordinated: No Fine Motor Movements are Fluid and Coordinated: No Coordination and Movement Description: Impaired gross coordination at baseline 2/2 parkinsons Trunk/Postural Assessment  Cervical Assessment Cervical Assessment: Exceptions to [facility] Cervical Strength Overall Cervical Strength Comments: forward head Thoracic Assessment Thoracic Assessment: Exceptions to [facility] Thoracic Strength Overall Thoracic Strength Comments: kyphotic posture Lumbar Assessment Lumbar Assessment: Exceptions to [facility] Lumbar Strength Overall Lumbar Strength Comments:  posterior pelvic tilt  Balance Static Sitting Balance Static Sitting - Balance Support: Right upper extremity supported;Feet supported Static Sitting - Level of Assistance: 5: Stand by assistance Dynamic Sitting Balance Dynamic Sitting - Balance Support: Feet supported;During functional activity;Bilateral upper extremity supported Dynamic Sitting - Level of Assistance: 4: Min assist Extremity/Trunk Assessment RUE Assessment RUE Assessment: Within Functional Limits LUE Assessment LUE Assessment: Exceptions to WFL LUE AROM (degrees) Overall AROM Left Upper Extremity: Deficits (Limited shoulder flexion 2/2 injury)  See FIM for current functional status  Joshua Glass  Joshua Glass 10/28/2013, 12:21 PM

## 2013-10-28 NOTE — Progress Notes (Signed)
Social Work Patient ID: Joshua Glass, male   DOB: [birth year], 80 y.o.   MRN: [id number]  Received SNF bed offer yesterday afternoon from [facility].  Pt and wife aware and accepting bed with plan for transfer today via ambulance.  MD and tx team aware.  Lennart Pall, LCSW

## 2013-10-28 NOTE — Progress Notes (Signed)
Physical Therapy Discharge Summary  Patient Details  Name: Joshua Glass MRN: [id number] Date of Birth: 1933-05-05  Today's Date: 10/28/2013 Time: [phone]  60 min  Patient has met 9 of 9 long term goals due to improved activity tolerance, improved balance, improved postural control, improved attention, improved awareness and improved coordination. Goals for stair negotiation, car transfers, and furniture transfers discharged due to change in d/c plan to SNF. Patient to discharge at overall wheelchair/short distance ambulation level Min-mod Assist. Pt requires mod-max verbal/tactile cues for initiation/completion and sequencing of functional mobility and increased time. Pt made slow, gradual, and inconsistent progress based on level of fatigue while in rehab and is being discharged to SNF for continued care. Patient's wife was frequently present for therapy for ongoing education about pt diagnosis and pt's current level of function and it was determined that she did not feel she could provide the necessary physical and cognitive assistance at discharge.  Reasons goals not met: NA  Recommendation:  Patient will benefit from ongoing skilled PT services in skilled nursing facility setting to continue to advance safe functional mobility, address ongoing impairments in initiation, attention, activity tolerance, coordination and motor planning, dynamic standing balance, postural control and decreased balance strategies, maximize patient independence, reduce caregiver burden, and minimize fall risk.  Equipment: No equipment provided-defer to next venue of care   Reasons for discharge: treatment goals met and discharge from hospital  Patient/family agrees with progress made and goals achieved: Yes  PT Discharge Skilled Treatment Interventions:  Pt received sitting in w/c after finishing breakfast and agreeable to therapy, reporting expected discharge to SNF around 11am. Focus of session on evaluation  of functional mobility in preparation for discharge. Pt ambulated 100 ft using RW and min A for progressive anterior lean and steering/obstacle negotiation. Pt performed all functional transfers with overall min A for initiation and anterior weightshift. Pt continued to report fear of falling as cause for decreased hip/knee flexion and anterior weightshift with transfers. Pt negotiated up/down 5 stairs using 2 rails with supervision-min guard and mod-max verbal cues for initiation and sequencing. In moderately busy hallway, pt propelled w/c 15' on straight path using R UE/BLEs with max verbal cues for sequencing and initiation. Pt unable to turn w/c requiring max assist. Pt transferred sit <> supine on flat bed with modA; however, pt able to transfer sit<>supine with HOB elevated and rails with minA and therefore met goal for bed mobility appropriate for discharge to SNF. Pt with no questions regarding discharge. Pt returned to room and left semi reclined in bed with all needs within reach.   Precautions/Restrictions Restrictions Weight Bearing Restrictions: No Vital Signs Therapy Vitals BP: 167/80 mmHg Pain Pain Assessment Pain Assessment: No/denies pain Cognition Overall Cognitive Status: Impaired/Different from baseline Arousal/Alertness: Lethargic Orientation Level: Oriented X4 Attention: Sustained Focused Attention: Appears intact Sustained Attention: Impaired Sustained Attention Impairment: Verbal basic;Functional basic Memory: Impaired Memory Impairment: Decreased recall of new information;Decreased short term memory Decreased Short Term Memory: Verbal basic;Functional basic Awareness: Impaired Awareness Impairment: Emergent impairment Problem Solving: Impaired Problem Solving Impairment: Verbal basic;Functional basic Executive Function: Initiating;Organizing;Sequencing;Self Monitoring;Self Correcting Sequencing: Impaired Sequencing Impairment: Verbal basic;Functional  basic Organizing: Impaired Organizing Impairment: Verbal basic;Functional basic Initiating: Impaired Initiating Impairment: Verbal basic;Functional basic Self Monitoring: Impaired Self Monitoring Impairment: Verbal basic;Functional basic Self Correcting: Impaired Self Correcting Impairment: Verbal basic;Functional basic Safety/Judgment: Impaired [employer] Scales of Cognitive Functioning: Automatic/appropriate Sensation Sensation Light Touch: Impaired Detail Light Touch Impaired Details: Impaired RUE;Impaired LUE;Impaired RLE;Impaired LLE Hot/Cold:  Appears Intact Proprioception: Impaired by gross assessment Additional Comments: Decreased sensation in B hands/feet due to neuropathy at baseline per wife's report Coordination Gross Motor Movements are Fluid and Coordinated: No Fine Motor Movements are Fluid and Coordinated: No Coordination and Movement Description: Impaired gross coordination at baseline 2/2 parkinsons Motor  Motor Motor: Other (comment) Motor - Skilled Clinical Observations: Motor impairments consistent w/ Parkinsons at baseline, generalized weakness  Mobility Bed Mobility Bed Mobility: Supine to Sit;Sit to Supine Supine to Sit: 4: Min assist;HOB elevated;With rails Supine to Sit Details: Tactile cues for initiation;Verbal cues for technique;Verbal cues for sequencing Sit to Supine: 4: Min assist;With rail;HOB elevated Sit to Supine - Details: Tactile cues for initiation;Verbal cues for technique;Verbal cues for sequencing Transfers Transfers: Yes Sit to Stand: 4: Min assist;With upper extremity assist;From chair/3-in-1;From bed;With armrests Sit to Stand Details: Tactile cues for initiation;Tactile cues for weight shifting;Manual facilitation for weight shifting;Verbal cues for sequencing Stand to Sit: 4: Min assist;With upper extremity assist;With armrests;To bed;To chair/3-in-1 Stand to Sit Details (indicate cue type and reason): Tactile cues for  initiation;Manual facilitation for weight shifting;Verbal cues for technique;Verbal cues for sequencing;Verbal cues for precautions/safety Stand Pivot Transfers: 4: Min assist;With armrests Stand Pivot Transfer Details: Verbal cues for technique;Verbal cues for sequencing;Verbal cues for safe use of DME/AE Locomotion  Ambulation Ambulation: Yes Ambulation/Gait Assistance: 4: Min assist Ambulation Distance (Feet): 100 Feet Assistive device: Rolling walker Ambulation/Gait Assistance Details: Verbal cues for safe use of DME/AE;Verbal cues for sequencing;Verbal cues for precautions/safety Ambulation/Gait Assistance Details: Pt demonstrates progressive anterior lean with improved B foot clearance, poor AD control for steering/obstacle negotiation Gait Gait: Yes Gait Pattern: Impaired Gait Pattern: Step-through pattern;Decreased step length - left;Decreased hip/knee flexion - left;Decreased hip/knee flexion - right;Decreased dorsiflexion - right;Decreased dorsiflexion - left;Trunk flexed;Decreased weight shift to right;Decreased weight shift to left;Lateral trunk lean to left Gait velocity: increased with fatigue, resulting in decreased control High Level Ambulation High Level Ambulation: Backwards walking Backwards Walking: Pt continues to require max verbal cues to initiate and sequence backing up to w/c or bed Stairs / Additional Locomotion Stairs: Yes Stairs Assistance: 5: Supervision;4: Min guard Stairs Assistance Details: Tactile cues for initiation;Verbal cues for sequencing Stair Management Technique: Two rails Number of Stairs: 5 Height of Stairs: 6 Wheelchair Mobility Wheelchair Mobility: Yes Wheelchair Assistance: 3: Mod assist Wheelchair Assistance Details: Verbal cues for technique;Verbal cues for sequencing Wheelchair Propulsion: Right upper extremity;Both lower extermities Wheelchair Parts Management: Needs assistance Distance: 98'  Trunk/Postural Assessment  Cervical  Assessment Cervical Assessment: Exceptions to [facility] Cervical Strength Overall Cervical Strength Comments: forward head Thoracic Assessment Thoracic Assessment: Exceptions to [facility] Thoracic Strength Overall Thoracic Strength Comments: kyphotic posture Lumbar Assessment Lumbar Assessment: Exceptions to [facility] Lumbar Strength Overall Lumbar Strength Comments: posterior pelvic tilt Postural Control Postural Control: Deficits on evaluation Righting Reactions: decreased reaction time or absent reaction and baseline Parkinson's  Balance Balance Balance Assessed: Yes Static Sitting Balance Static Sitting - Balance Support: Feet supported Static Sitting - Level of Assistance: 6: Modified independent (Device/Increase time) Static Sitting - Comment/# of Minutes: 10 Dynamic Sitting Balance Dynamic Sitting - Balance Support: Feet supported;During functional activity Dynamic Sitting - Level of Assistance: 4: Min assist;5: Stand by assistance Dynamic Sitting - Balance Activities: Lateral lean/weight shifting;Forward lean/weight shifting;Reaching for objects;Ball toss;Trunk control activities Sitting balance - Comments: flexed trunk Static Standing Balance Static Standing - Balance Support: No upper extremity supported Static Standing - Level of Assistance: 5: Stand by assistance Static Standing - Comment/# of Minutes: 5  Dynamic Standing Balance Dynamic Standing - Balance Support: Left upper extremity supported Dynamic Standing - Level of Assistance: 4: Min assist;3: Mod assist Dynamic Standing - Balance Activities: Forward lean/weight shifting;Lateral lean/weight shifting;Reaching for objects Dynamic Standing - Comments: posterior lean, unable to weightshift anteriorly Extremity Assessment  RUE Assessment RUE Assessment: Within Functional Limits LUE Assessment LUE Assessment: Exceptions to WFL LUE AROM (degrees) Overall AROM Left Upper Extremity: Deficits;Due to premorbid status (Limited 2/2 RTC  injury) RLE Assessment RLE Assessment: Exceptions to [facility] RLE Strength RLE Overall Strength Comments: fair to good functional strength LLE Assessment LLE Assessment: Exceptions to [facility] LLE Strength LLE Overall Strength Comments: Fair functional strength  See FIM for current functional status  Laretta Alstrom 10/28/2013, 5:11 PM

## 2013-10-28 NOTE — Progress Notes (Signed)
[facility] PHYSICAL MEDICINE & REHABILITATION     PROGRESS NOTE    Subjective/Complaints: No complaints. Happy to be going to next venue today. A 12 point review of systems has been performed and if not noted above is otherwise negative.   Objective: Vital Signs: Blood pressure 161/83, pulse 70, temperature 98.5 F (36.9 C), temperature source Oral, resp. rate 17, height 5\' 10"  (1.778 m), weight 98.612 kg (217 lb 6.4 oz), SpO2 96.00%. No results found. No results found for this basename: WBC, HGB, HCT, PLT,  in the last 72 hours No results found for this basename: NA, K, CL, CO, GLUCOSE, BUN, CREATININE, CALCIUM,  in the last 72 hours CBG (last 3)   Recent Labs  10/27/13 1634 10/27/13 2106 10/28/13 0723  GLUCAP 142* 170* 112*    Wt Readings from Last 3 Encounters:  10/27/13 98.612 kg (217 lb 6.4 oz)  10/09/13 105 kg (231 lb 7.7 oz)  10/09/13 105 kg (231 lb 7.7 oz)    Physical Exam:  Eyes: EOM are normal.  Neck: Normal range of motion. Neck supple. No thyromegaly present.  Cardiovascular:  Cardiac rate controlled. Reg rhythm  Respiratory: Effort normal and breath sounds normal. No respiratory distress.  GI: Soft. Bowel sounds are normal. He exhibits no distension. Non-tender Neurological:  Quite alert. Processing better. Speech more clear. RUE is 3- to 3/5 deltoid, bicep, tricep, HI. LUE is 1+ deltoid/shoulder (RTC), 3-bicep, tricep, 3 hand.Marland Kitchen RLE is3-/5 prox HF, KE, to 2-distally at ankle with ADF/APF---inconsistent (but better) initiating movement. LLE 3- HF, 3 KE, ankle. Senses pain throughout on right.  Skin:  Craniotomy site clean, staples out--healed Psych:   Affect more dynamic.    Assessment/Plan: 1. Functional deficits secondary to left SDH after fall which require 3+ hours per day of interdisciplinary therapy in a comprehensive inpatient rehab setting. Physiatrist is providing close team supervision and 24 hour management of active medical problems listed  below. Physiatrist and rehab team continue to assess barriers to discharge/monitor patient progress toward functional and medical goals.  To SNF today   FIM: FIM - Bathing Bathing Steps Patient Completed: Chest;Right Arm;Left Arm;Abdomen;Front perineal area;Right upper leg;Left upper leg Bathing: 3: Mod-Patient completes 5-7 39f 10 parts or 50-74%  FIM - Upper Body Dressing/Undressing Upper body dressing/undressing steps patient completed: Pull shirt over trunk;Thread/unthread left sleeve of pullover shirt/dress;Thread/unthread right sleeve of pullover shirt/dresss Upper body dressing/undressing: 4: Min-Patient completed 75 plus % of tasks FIM - Lower Body Dressing/Undressing Lower body dressing/undressing steps patient completed: Thread/unthread left pants leg;Don/Doff left sock;Don/Doff left shoe Lower body dressing/undressing: 2: Max-Patient completed 25-49% of tasks  FIM - Toileting Toileting steps completed by patient: Adjust clothing prior to toileting Toileting: 2: Max-Patient completed 1 of 3 steps  FIM - Radio producer Devices: Grab bars;Elevated toilet seat Toilet Transfers: 4-To toilet/BSC: Min A (steadying Pt. > 75%);4-From toilet/BSC: Min A (steadying Pt. > 75%)  FIM - Control and instrumentation engineer Devices: Walker;Arm rests Bed/Chair Transfer: 3: Bed > Chair or W/C: Mod A (lift or lower assist);3: Chair or W/C > Bed: Mod A (lift or lower assist)  FIM - Locomotion: Wheelchair Distance: 25' Locomotion: Wheelchair: 1: Total Assistance/staff pushes wheelchair (Pt<25%) FIM - Locomotion: Ambulation Locomotion: Ambulation Assistive Devices: Administrator Ambulation/Gait Assistance: 3: Mod assist;4: Min assist Locomotion: Ambulation: 2: Travels 50 - 149 ft with moderate assistance (Pt: 50 - 74%)  Comprehension Comprehension Mode: Auditory Comprehension: 5-Follows basic conversation/direction: With extra time/assistive  device  Expression  Expression Mode: Verbal Expression: 3-Expresses basic 50 - 74% of the time/requires cueing 25 - 50% of the time. Needs to repeat parts of sentences.  Social Interaction Social Interaction: 4-Interacts appropriately 75 - 89% of the time - Needs redirection for appropriate language or to initiate interaction.  Problem Solving Problem Solving: 3-Solves basic 50 - 74% of the time/requires cueing 25 - 49% of the time  Memory Memory: 3-Recognizes or recalls 50 - 74% of the time/requires cueing 25 - 49% of the time  Medical Problem List and Plan:  1. Left subdural hematoma after a fall. Status post craniotomy evacuation of hematoma 10/07/2013  2. DVT Prophylaxis/Anticoagulation: SCDs. Monitor for any signs of DVT  3. Pain Management: Ultram as needed. Monitor with increased mobility  4. Neuropsych: This patient is not capable of making decisions on his own behalf  5.Dysphagia.Dysphgia 3 nectar liquids.Follow up speech therapy.  6. Seizure prophylaxis. Keppra 500 mg twice a day.  7. Parkinson's disease. Continue Sinemet as directed.  .8 Diabetes mellitus with peripheral neuropathy. Latest hemoglobin A1c 8.3. Check blood sugars a.c. and at bedtime. Continue tradjenta 5 mg daily.   -improved control 9. Hypertension. Norvasc 10 mg daily, Lasix 20 mg daily. Will add low dose ace for better control 10. Hyperlipidemia. Zocor  11. History of atrial fibrillation. No anticoagulants prior to admission. Cardiac rate controlled   -continue to replace potassium 12. Asthma. Singulair 10 mg each bedtime. No shortness of breath reported  13. BPH. Flomax 0.4 mg daily. 14. Hypothyroidism. Synthroid  15. Obstructive sleep apnea. Nasal cpap 16. Leukocytosis--- no signs of infection at present--trending down 17. Dysphagia: mulitfactorial D3 thins per SLP  -appetite much improved      LOS (Days) 15 A FACE TO FACE EVALUATION WAS PERFORMED  Meredith Staggers 10/28/2013 8:06 AM

## 2013-11-24 ENCOUNTER — Ambulatory Visit: Payer: PRIVATE HEALTH INSURANCE | Admitting: Neurology

## 2013-12-21 ENCOUNTER — Inpatient Hospital Stay: Payer: Medicare Other | Admitting: Physical Medicine & Rehabilitation

## 2013-12-24 ENCOUNTER — Ambulatory Visit: Payer: PRIVATE HEALTH INSURANCE | Admitting: Neurology

## 2014-01-06 ENCOUNTER — Emergency Department (HOSPITAL_COMMUNITY): Payer: Medicare Other

## 2014-01-06 ENCOUNTER — Emergency Department (HOSPITAL_COMMUNITY)
Admission: EM | Admit: 2014-01-06 | Discharge: 2014-01-06 | Disposition: A | Discharge status: Home / Self Care | Payer: Medicare Other | Attending: Emergency Medicine | Admitting: Emergency Medicine

## 2014-01-06 DIAGNOSIS — R059 Cough, unspecified: Secondary | ICD-10-CM | POA: Insufficient documentation

## 2014-01-06 DIAGNOSIS — Z87891 Personal history of nicotine dependence: Secondary | ICD-10-CM | POA: Insufficient documentation

## 2014-01-06 DIAGNOSIS — E1149 Type 2 diabetes mellitus with other diabetic neurological complication: Secondary | ICD-10-CM | POA: Insufficient documentation

## 2014-01-06 DIAGNOSIS — J3489 Other specified disorders of nose and nasal sinuses: Secondary | ICD-10-CM | POA: Insufficient documentation

## 2014-01-06 DIAGNOSIS — Z79899 Other long term (current) drug therapy: Secondary | ICD-10-CM | POA: Insufficient documentation

## 2014-01-06 DIAGNOSIS — I10 Essential (primary) hypertension: Secondary | ICD-10-CM

## 2014-01-06 DIAGNOSIS — M7989 Other specified soft tissue disorders: Secondary | ICD-10-CM | POA: Insufficient documentation

## 2014-01-06 DIAGNOSIS — R4182 Altered mental status, unspecified: Secondary | ICD-10-CM

## 2014-01-06 DIAGNOSIS — R05 Cough: Secondary | ICD-10-CM | POA: Insufficient documentation

## 2014-01-06 DIAGNOSIS — E039 Hypothyroidism, unspecified: Secondary | ICD-10-CM | POA: Insufficient documentation

## 2014-01-06 DIAGNOSIS — E669 Obesity, unspecified: Secondary | ICD-10-CM | POA: Insufficient documentation

## 2014-01-06 DIAGNOSIS — R5382 Chronic fatigue, unspecified: Secondary | ICD-10-CM

## 2014-01-06 DIAGNOSIS — E1142 Type 2 diabetes mellitus with diabetic polyneuropathy: Secondary | ICD-10-CM | POA: Insufficient documentation

## 2014-01-06 DIAGNOSIS — Z85828 Personal history of other malignant neoplasm of skin: Secondary | ICD-10-CM | POA: Insufficient documentation

## 2014-01-06 DIAGNOSIS — G4733 Obstructive sleep apnea (adult) (pediatric): Secondary | ICD-10-CM | POA: Insufficient documentation

## 2014-01-06 DIAGNOSIS — K5900 Constipation, unspecified: Secondary | ICD-10-CM | POA: Insufficient documentation

## 2014-01-06 DIAGNOSIS — Z7982 Long term (current) use of aspirin: Secondary | ICD-10-CM | POA: Insufficient documentation

## 2014-01-06 DIAGNOSIS — G9332 Myalgic encephalomyelitis/chronic fatigue syndrome: Secondary | ICD-10-CM | POA: Insufficient documentation

## 2014-01-06 DIAGNOSIS — IMO0002 Reserved for concepts with insufficient information to code with codable children: Secondary | ICD-10-CM | POA: Insufficient documentation

## 2014-01-06 LAB — BASIC METABOLIC PANEL WITH GFR
BUN: 12 mg/dL (ref 6–23)
CO2: 24 meq/L (ref 19–32)
Calcium: 9.3 mg/dL (ref 8.4–10.5)
Chloride: 101 meq/L (ref 96–112)
Creatinine, Ser: 0.85 mg/dL (ref 0.50–1.35)
GFR calc Af Amer: >90 mL/min
GFR calc non Af Amer: 81 mL/min — ABNORMAL LOW
Glucose, Bld: 208 mg/dL — ABNORMAL HIGH (ref 70–99)
Potassium: 3.3 meq/L — ABNORMAL LOW (ref 3.7–5.3)
Sodium: 140 meq/L (ref 137–147)

## 2014-01-06 LAB — CBC WITH DIFFERENTIAL/PLATELET
BASOS ABS: 0 10*3/uL (ref 0.0–0.1)
Basophils Relative: 0 % (ref 0–1)
EOS PCT: 1 % (ref 0–5)
Eosinophils Absolute: 0.2 10*3/uL (ref 0.0–0.7)
HEMATOCRIT: 38.4 % — AB (ref 39.0–52.0)
HEMOGLOBIN: 12.9 g/dL — AB (ref 13.0–17.0)
LYMPHS ABS: 3.1 10*3/uL (ref 0.7–4.0)
LYMPHS PCT: 19 % (ref 12–46)
MCH: 28.7 pg (ref 26.0–34.0)
MCHC: 33.6 g/dL (ref 30.0–36.0)
MCV: 85.5 fL (ref 78.0–100.0)
MONOS PCT: 11 % (ref 3–12)
Monocytes Absolute: 1.8 10*3/uL — ABNORMAL HIGH (ref 0.1–1.0)
NEUTROS ABS: 11.6 10*3/uL — AB (ref 1.7–7.7)
Neutrophils Relative %: 69 % (ref 43–77)
Platelets: 258 10*3/uL (ref 150–400)
RBC: 4.49 MIL/uL (ref 4.22–5.81)
RDW: 15.2 % (ref 11.5–15.5)
WBC: 16.8 10*3/uL — AB (ref 4.0–10.5)

## 2014-01-06 LAB — I-STAT TROPONIN, ED: Troponin i, poc: 0.01 ng/mL (ref 0.00–0.08)

## 2014-01-06 LAB — TSH: TSH: 1.7 u[IU]/mL (ref 0.350–4.500)

## 2014-01-06 LAB — URINALYSIS, ROUTINE W REFLEX MICROSCOPIC
Bilirubin Urine: NEGATIVE
GLUCOSE, UA: 250 mg/dL — AB
Hgb urine dipstick: NEGATIVE
KETONES UR: NEGATIVE mg/dL
LEUKOCYTES UA: NEGATIVE
NITRITE: NEGATIVE
PH: 6.5 (ref 5.0–8.0)
Protein, ur: 30 mg/dL — AB
SPECIFIC GRAVITY, URINE: 1.018 (ref 1.005–1.030)
Urobilinogen, UA: 1 mg/dL (ref 0.0–1.0)

## 2014-01-06 LAB — CBG MONITORING, ED: Glucose-Capillary: 209 mg/dL — ABNORMAL HIGH (ref 70–99)

## 2014-01-06 LAB — URINE MICROSCOPIC-ADD ON

## 2014-01-06 NOTE — Discharge Instructions (Signed)
Altered Mental Status Altered mental status most often refers to an abnormal change in your responsiveness and awareness. It can affect your speech, thought, mobility, memory, attention span, or alertness. It can range from slight confusion to complete unresponsiveness (coma). Altered mental status can be a sign of a serious underlying medical condition. Rapid evaluation and medical treatment is necessary for patients having an altered mental status. CAUSES   Low blood sugar (hypoglycemia) or diabetes.  Severe loss of body fluids (dehydration) or a body salt (electrolyte) imbalance.  A stroke or other neurologic problem, such as dementia or delirium.  A head injury or tumor.  A drug or alcohol overdose.  Exposure to toxins or poisons.  Depression, anxiety, and stress.  A low oxygen level (hypoxia).  An infection.  Blood loss.  Twitching or shaking (seizure).  Heart problems, such as heart attack or heart rhythm problems (arrhythmias).  A body temperature that is too low or too high (hypothermia or hyperthermia). DIAGNOSIS  A diagnosis is based on your history, symptoms, physical and neurologic examinations, and diagnostic tests. Diagnostic tests may include:  Measurement of your blood pressure, pulse, breathing, and oxygen levels (vital signs).  Blood tests.  Urine tests.  X-ray exams.  A computerized magnetic scan (magnetic resonance imaging, MRI).  A computerized X-ray scan (computed tomography, CT scan). TREATMENT  Treatment will depend on the cause. Treatment may include:  Management of an underlying medical or mental health condition.  Critical care or support in the hospital. [place]   Only take over-the-counter or prescription medicines for pain, discomfort, or fever as directed by your caregiver.  Manage underlying conditions as directed by your caregiver.  Eat a healthy, well-balanced diet to maintain strength.  Join a support group or  prevention program to cope with the condition or trauma that caused the altered mental status. Ask your caregiver to help choose a program that works for you.  Follow up with your caregiver for further examination, therapy, or testing as directed. SEEK MEDICAL CARE IF:   You feel unwell or have chills.  You or your family notice a change in your behavior or your alertness.  You have trouble following your caregiver's treatment plan.  You have questions or concerns. SEEK IMMEDIATE MEDICAL CARE IF:   You have a rapid heartbeat or have chest pain.  You have difficulty breathing.  You have a fever.  You have a headache with a stiff neck.  You cough up blood.  You have blood in your urine or stool.  You have severe agitation or confusion. MAKE SURE YOU:   Understand these instructions.  Will watch your condition.  Will get help right away if you are not doing well or get worse. Document Released: 12/26/2009 Document Revised: 09/30/2011 Document Reviewed: 12/26/2009 [facility] Patient Information 2015 [place], [place]. This information is not intended to replace advice given to you by your health care provider. Make sure you discuss any questions you have with your health care provider.  Confusion Confusion is the inability to think with your usual speed or clarity. Confusion may come on quickly or slowly over time. How quickly the confusion comes on depends on the cause. Confusion can be due to any number of causes. CAUSES   Concussion, head injury, or head trauma.  Seizures.  Stroke.  Fever.  Senility.  Heightened emotional states like rage or terror.  Mental illness in which the person loses the ability to determine what is real and what is not (hallucinations).  Infections.  Toxic effects from alcohol, drugs, or prescription medicines.  Dehydration and an imbalance of salts in the body (electrolytes).  Lack of sleep.  Low blood sugar (diabetes).  Low levels  of oxygen (for example from chronic lung disorders).  Drug interactions or other medication side effects.  Nutritional deficiencies, especially niacin, thiamine, vitamin C, or vitamin B.  Sudden drop in body temperature (hypothermia).  Illness in the elderly. Constipation can result in confusion. An elderly person who is hospitalized may become confused due to change in daily routine. SYMPTOMS  People often describe their thinking as cloudy or unclear when they are confused. Confusion can also include feeling disoriented. That means you are unaware of where or who you are. You may also not know what the date or time is. If confused, you may also have difficulty paying attention, remembering and making decisions. Some people also act aggressively when they are confused.  DIAGNOSIS  The medical evaluation of confusion may include:  Blood and urine tests.  X-rays.  Brain and nervous system tests.  Analyzing your brain waves (electroencphalogram or EEG).  A special X-ray (MRI) of your head or other special studies. Your physician will ask questions such as:  Do you get days and nights mixed up?  Are you awake during regular sleep times?  Do you have trouble recognizing people?  Do you know where you are?  Do you know the date and time?  Does the confusion come and go?  Is the confusion quickly getting worse?  Has there been a recent illness?  Has there been a recent head injury?  Are you diabetic?  Do you have a lung disorder?  What medication are you taking?  Have you taken drugs or alcohol? TREATMENT  An admission to the hospital may not be needed, but a confused person should not be left alone. Stay with a family member or friend until the confusion clears. Avoid alcohol, pain relievers or sedative drugs until you have fully recovered. Do not drive until your caregiver says it is okay. HOME CARE INSTRUCTIONS What family and friends can do:  To find out if someone  is confused ask him or her their name, age, and the date. If the person is unsure or answers incorrectly, he or she is confused.  Always introduce yourself, no matter how well the person knows you.  Often remind the person of his or her location.  Place a calendar and clock near the confused person.  Talk about current events and plans for the day.  Try to keep the environment calm, quiet and peaceful.  Make sure the patient keeps follow up appointments with their physician. PREVENTION  Ways to prevent confusion:  Avoid alcohol.  Eat a balanced diet.  Get enough sleep.  Do not become isolated. Spend time with other people and make plans for your days.  Keep careful watch on your blood sugar levels if you are diabetic. SEEK IMMEDIATE MEDICAL CARE IF:   You develop severe headaches, repeated vomiting, seizures, blackouts or slurred speech.  There is increasing confusion, weakness, numbness, restlessness or personality changes.  You develop a loss of balance, have marked dizziness, feel uncoordinated or fall.  You have delusions, hallucinations or develop severe anxiety.  Your family members think you need to be rechecked. Document Released: 08/15/2004 Document Revised: 09/30/2011 Document Reviewed: 04/12/2008 [facility] Patient Information 2015 [place], [place]. This information is not intended to replace advice given to you by your health care provider. Make sure  you discuss any questions you have with your health care provider.  Hypertension Hypertension, commonly called high blood pressure, is when the force of blood pumping through your arteries is too strong. Your arteries are the blood vessels that carry blood from your heart throughout your body. A blood pressure reading consists of a higher number over a lower number, such as 110/72. The higher number (systolic) is the pressure inside your arteries when your heart pumps. The lower number (diastolic) is the pressure inside  your arteries when your heart relaxes. Ideally you want your blood pressure below 120/80. Hypertension forces your heart to work harder to pump blood. Your arteries may become narrow or stiff. Having hypertension puts you at risk for heart disease, stroke, and other problems.  RISK FACTORS Some risk factors for high blood pressure are controllable. Others are not.  Risk factors you cannot control include:   Race. You may be at higher risk if you are African American.  Age. Risk increases with age.  Gender. Men are at higher risk than women before age 13 years. After age 70, women are at higher risk than men. Risk factors you can control include:  Not getting enough exercise or physical activity.  Being overweight.  Getting too much fat, sugar, calories, or salt in your diet.  Drinking too much alcohol. SIGNS AND SYMPTOMS Hypertension does not usually cause signs or symptoms. Extremely high blood pressure (hypertensive crisis) may cause headache, anxiety, shortness of breath, and nosebleed. DIAGNOSIS  To check if you have hypertension, your health care provider will measure your blood pressure while you are seated, with your arm held at the level of your heart. It should be measured at least twice using the same arm. Certain conditions can cause a difference in blood pressure between your right and left arms. A blood pressure reading that is higher than normal on one occasion does not mean that you need treatment. If one blood pressure reading is high, ask your health care provider about having it checked again. TREATMENT  Treating high blood pressure includes making lifestyle changes and possibly taking medication. Living a healthy lifestyle can help lower high blood pressure. You may need to change some of your habits. Lifestyle changes may include:  Following the DASH diet. This diet is high in fruits, vegetables, and whole grains. It is low in salt, red meat, and added  sugars.  Getting at least 2 1/2 hours of brisk physical activity every week.  Losing weight if necessary.  Not smoking.  Limiting alcoholic beverages.  Learning ways to reduce stress. If lifestyle changes are not enough to get your blood pressure under control, your health care provider may prescribe medicine. You may need to take more than one. Work closely with your health care provider to understand the risks and benefits. HOME CARE INSTRUCTIONS  Have your blood pressure rechecked as directed by your health care provider.   Only take medicine as directed by your health care provider. Follow the directions carefully. Blood pressure medicines must be taken as prescribed. The medicine does not work as well when you skip doses. Skipping doses also puts you at risk for problems.   Do not smoke.   Monitor your blood pressure at home as directed by your health care provider. SEEK MEDICAL CARE IF:   You think you are having a reaction to medicines taken.  You have recurrent headaches or feel dizzy.  You have swelling in your ankles.  You have trouble with your  vision. SEEK IMMEDIATE MEDICAL CARE IF:  You develop a severe headache or confusion.  You have unusual weakness, numbness, or feel faint.  You have severe chest or abdominal pain.  You vomit repeatedly.  You have trouble breathing. MAKE SURE YOU:   Understand these instructions.  Will watch your condition.  Will get help right away if you are not doing well or get worse. Document Released: 07/08/2005 Document Revised: 07/13/2013 Document Reviewed: 04/30/2013 [facility] Patient Information 2015 [place], [place]. This information is not intended to replace advice given to you by your health care provider. Make sure you discuss any questions you have with your health care provider.

## 2014-01-06 NOTE — ED Notes (Signed)
Resident in with patient and patient family to discuss plans for disposition. Per resident, will have case management speak with family prior to patient d/c home
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
## 2014-01-06 NOTE — ED Notes (Signed)
Pt from home via [place] with c/o hypertension.  Family reported a 358 systolic.  12 lead unremarkable, neuro intact.  Hx of DM, neuropathy, parkinson's.  Pt in NAD, A&O.

## 2014-01-06 NOTE — ED Notes (Signed)
CBG 209. 

## 2014-01-06 NOTE — ED Notes (Signed)
Per Noel Gerold, he has spoke with case management that will come to see patient prior to d/c

## 2014-01-06 NOTE — ED Notes (Signed)
Pt uses walker/w/c at home for assistance. Pt required +2 staff to get out of bed and to assist with ambulation. Will call [facility] for transport back home

## 2014-01-06 NOTE — ED Notes (Signed)
Pt given sandwich bag and drink. Updated pt family that we are currently waiting on Case Management consult

## 2014-01-06 NOTE — ED Notes (Signed)
[facility] arrived for transport home

## 2014-01-06 NOTE — ED Notes (Signed)
[facility] has been called for transport d/c

## 2014-01-06 NOTE — ED Notes (Signed)
Patient transported to CT 

## 2014-01-07 NOTE — ED Provider Notes (Signed)
I saw and evaluated the patient, reviewed the resident's note and I agree with the findings and plan.   EKG Interpretation   Date/Time:  Thursday January 06 2014 14:45:57 EDT Ventricular Rate:  99 PR Interval:  159 QRS Duration: 92 QT Interval:  337 QTC Calculation: 432 R Axis:   -42 Text Interpretation:  Sinus or ectopic atrial rhythm Ventricular premature  complex Left axis deviation Anterior infarct, old Confirmed by Alvino Chapel   MD, NATHAN [phone]) on 01/06/2014 3:52:28 PM     Patient with altered mental status. Has had general decline. Discussion with family and social work. Did not want help with placement.  Jasper Riling. Alvino Chapel, MD 01/07/14 0028

## 2014-05-18 ENCOUNTER — Ambulatory Visit (INDEPENDENT_AMBULATORY_CARE_PROVIDER_SITE_OTHER): Payer: Medicare Other | Admitting: Internal Medicine

## 2014-05-18 ENCOUNTER — Ambulatory Visit: Payer: PRIVATE HEALTH INSURANCE | Admitting: Internal Medicine

## 2014-05-18 ENCOUNTER — Encounter: Payer: Self-pay | Admitting: Internal Medicine

## 2014-05-18 VITALS — BP 120/70 | HR 76

## 2014-05-18 DIAGNOSIS — Z23 Encounter for immunization: Secondary | ICD-10-CM

## 2014-05-18 DIAGNOSIS — G4733 Obstructive sleep apnea (adult) (pediatric): Secondary | ICD-10-CM

## 2014-05-18 MED ORDER — BENZONATATE 200 MG PO CAPS: 200.0000 mg | ORAL_CAPSULE | Freq: Three times a day (TID) | ORAL | Status: DC | PRN

## 2014-05-18 NOTE — Patient Instructions (Signed)
Flu vax  Script for Tessalon/ benzonatate perles to drug store to use as needed for cough

## 2014-05-18 NOTE — Progress Notes (Signed)
Subjective:    Patient ID: Joshua Glass, male    DOB: 09/25/1932, 80 y.o.   MRN: [id number]  HPI 03/29/11- 80 year old male former smoker followed for obstructive sleep apnea, allergic rhinitis, complicated by DM, HBP. Last here -03/30/10- For the last 4-5 months he has felt no energy. He asks if this is age. He uses CPAP all night every night, and sleep quality feels the same to him, but doesn't know if he snores through. Avoids caffeine for heart rhythm. He is no more likely to doze off during the day that he was at last visit when he thought he was going well..  Denies heart or known thyroid problems. To see his PCP, Dr Henrene Pastor, in October.  Flu vax talk.   03/30/12- 80 year old male former smoker followed for obstructive sleep apnea, allergic rhinitis, complicated by DM, HBP.  1 yr f/u--states he is not sleeping very well at night, wearing CPAP machine approx 7 hrs,  denies any other symptoms CPAP 17/ [facility] nasal pillows mask. Pressure is comfortable. He is told that sometimes he snores through the mask, but nasal pillows and a higher pressure without more leak. Some daytime sleepiness he blames on medication and treats with naps Since last here fell > SDH  03/31/13-  80 year old male former smoker followed for obstructive sleep apnea, allergic rhinitis, complicated by DM, HBP. FOLLOWS FOR:  Wear CPAP 17/ / Advanced 6 hours per night.  Discuss new mask and possible machine.  Wife concned he is not really sleeping waking up not well rested, would like to know could he be tested Never got good mask fit. Stays sleepy. Restless insomnia, not uncomfortable. No longer drives. Tends to fall backwards.  05/18/13-  80 year old male former smoker followed for obstructive sleep apnea, allergic rhinitis, complicated by DM, HBP.  Wife here    Had flu vaccine FOLLOWS FOR: Wears CPAP 17/ Advanced every night for about 6-7 hours; pressure working well for patient. DME is [facility]. Download suggests 12 would be adequate,  but he is comfortable staying at 41 for now. Frequent nocturia. Diagnosed with Parkinson's disease/Dr. Jannifer Franklin.  05/18/14- 80 year old male former smoker followed for obstructive sleep apnea, allergic rhinitis, complicated by DM, HBP, Parkinsons/ subdural.  Wife here    Had flu vaccine FOLLOW FOR: OSA; wears CPAP 17/ Advanced every night; approximately 8 hours nightly; dry cough Stop going to neurologist "no help and too much trouble", walk with a walker with support. Dry cough, worse during the daytime. Treating with "vitamin O"-"electrified seawater with minerals, that they buy online CXR - 01/06/14 IMPRESSION:  No active disease.  Electronically Signed  By: Shon Hale M.D.  On: 01/06/2014 16:27  Review of Systems- see HPI Constitutional:   No-   weight loss, night sweats, fevers, chills,  + fatigue, lassitude. HEENT:   No-  headaches, difficulty swallowing, tooth/dental problems, sore throat,       No-  sneezing, itching, ear ache, nasal congestion, post nasal drip,  CV:  No-   chest pain, orthopnea, PND, swelling in lower extremities, anasarca, dizziness, palpitations Resp: No-   shortness of breath with exertion or at rest.              No-   productive cough,  + non-productive cough,  No-  coughing up of blood.              No-   change in color of mucus.  No- wheezing.   Skin: No-   rash or lesions. GI:  No-   heartburn, indigestion, abdominal pain, nausea, vomiting,  GU:  MS:  Joint pain,  Neuro- nothing unusual  Psych:  No- change in mood or affect. No depression or anxiety.  No memory loss.    Objective:   Physical Exam General- Alert, Oriented, Affect-appropriate, Distress- none acute  Obese, passive  Skin- rash-none, lesions- none, excoriation- none Lymphadenopathy- none Head- atraumatic            Eyes- Gross vision intact, PERRLA, conjunctivae clear secretions            Ears- Hearing, canals normal            Nose- Clear, No- Septal dev, mucus, polyps, erosion,  perforation             Throat- Mallampati III , mucosa clear , drainage- none, tonsils- atrophic Neck- flexible , trachea midline, no stridor , thyroid nl, carotid no bruits Chest - symmetrical excursion , unlabored           Heart/CV- RRR , no murmur , no gallop  , no rub, nl s1 s2                           - JVD- none , edema+ trace, stasis changes- none, varices- none           Lung- clear to P&A, wheeze- none, cough- none , dullness-none, rub- none           Chest wall-  Abd-  Br/ Gen/ Rectal- Not done, not indicated Extrem- cyanosis- none, clubbing, none, atrophy- none, strength- nl, +wheelchair with                   restraint strap Neuro- + flat facial expression    Assessment & Plan:

## 2014-05-22 NOTE — Assessment & Plan Note (Signed)
Good compliance and control with wife's help

## 2014-08-30 ENCOUNTER — Telehealth: Payer: Self-pay | Admitting: Internal Medicine

## 2014-08-30 MED ORDER — BENZONATATE 200 MG PO CAPS: 200.0000 mg | ORAL_CAPSULE | Freq: Three times a day (TID) | ORAL | Status: AC | PRN

## 2014-08-30 NOTE — Telephone Encounter (Signed)
Spoke with pt's wife, is requesting a refill on benzonatate.  Last ordered 05/18/14.  Pt uses [facility].  Last ov: 05/18/14 Next ov: 05/19/15  [facility] please advise thank you.  Allergies  Allergen Reactions  . Hydrocodone Other (See Comments)    Lethargic per wife  . Lisinopril     Nausea and vomiting   Current Outpatient Prescriptions on File Prior to Visit  Medication Sig Dispense Refill  . amLODipine (NORVASC) 10 MG tablet Take 10 mg by mouth daily.    . Ascorbic Acid (VITAMIN C) 1000 MG tablet Take 1,000 mg by mouth 4 (four) times daily.    Marland Kitchen aspirin EC 81 MG tablet Take 81 mg by mouth daily.     Marland Kitchen b complex vitamins tablet Take 1 tablet by mouth daily.    . benzonatate (TESSALON) 200 MG capsule Take 1 capsule (200 mg total) by mouth 3 (three) times daily as needed for cough. 30 capsule 1  . cholecalciferol (VITAMIN D) 1000 UNITS tablet Take 2,000 Units by mouth daily.    Marland Kitchen dextromethorphan (DELSYM) 30 MG/5ML liquid Take 2.5 mLs (15 mg total) by mouth 2 (two) times daily. For cough 89 mL 0  . ketotifen (ZADITOR) 0.025 % ophthalmic solution Place 1 drop into both eyes daily.    Marland Kitchen linagliptin (TRADJENTA) 5 MG TABS tablet Take 5 mg by mouth daily.    Marland Kitchen loratadine (CLARITIN) 10 MG tablet Take 10 mg by mouth daily as needed.     Marland Kitchen losartan (COZAAR) 25 MG tablet Take 50 mg by mouth daily.     . montelukast (SINGULAIR) 10 MG tablet Take 10 mg by mouth at bedtime. As needed    . NON FORMULARY Take 30 drops by mouth 2 (two) times daily. Vitamin O    . omeprazole (PRILOSEC) 40 MG capsule Take 40 mg by mouth 2 (two) times daily.    . [DISCONTINUED] glipiZIDE (GLUCOTROL XL) 10 MG 24 hr tablet Take 10 mg by mouth daily.      . [DISCONTINUED] insulin glargine (LANTUS SOLOSTAR) 100 UNIT/ML injection Inject into the skin 2 (two) times daily. 30 units every morning and 40 units at night    . [DISCONTINUED] potassium chloride (KLOR-CON) 10 MEQ CR tablet Take 20 mEq by mouth every other  day.     . [DISCONTINUED] triamterene-hydrochlorothiazide (MAXZIDE-25) 37.5-25 MG per tablet Take 1 tablet by mouth daily.      No current facility-administered medications on file prior to visit.

## 2014-08-30 NOTE — Telephone Encounter (Signed)
Called spoke with pt's spouse and advised that [facility] okayed the Tessalon x6 months Spouse voiced her understanding Rx sent to verified pharmacy Nothing further needed; will sign off

## 2014-08-30 NOTE — Telephone Encounter (Signed)
Ok to refill for 6 months 

## 2014-10-19 IMAGING — CR DG CHEST 1V PORT
1 series · 1 of 1 positions shown · non-contrast
Comparison: September 23, 2013

CLINICAL DATA: Difficulty breathing

EXAM:
PORTABLE CHEST - 1 VIEW

[AP]
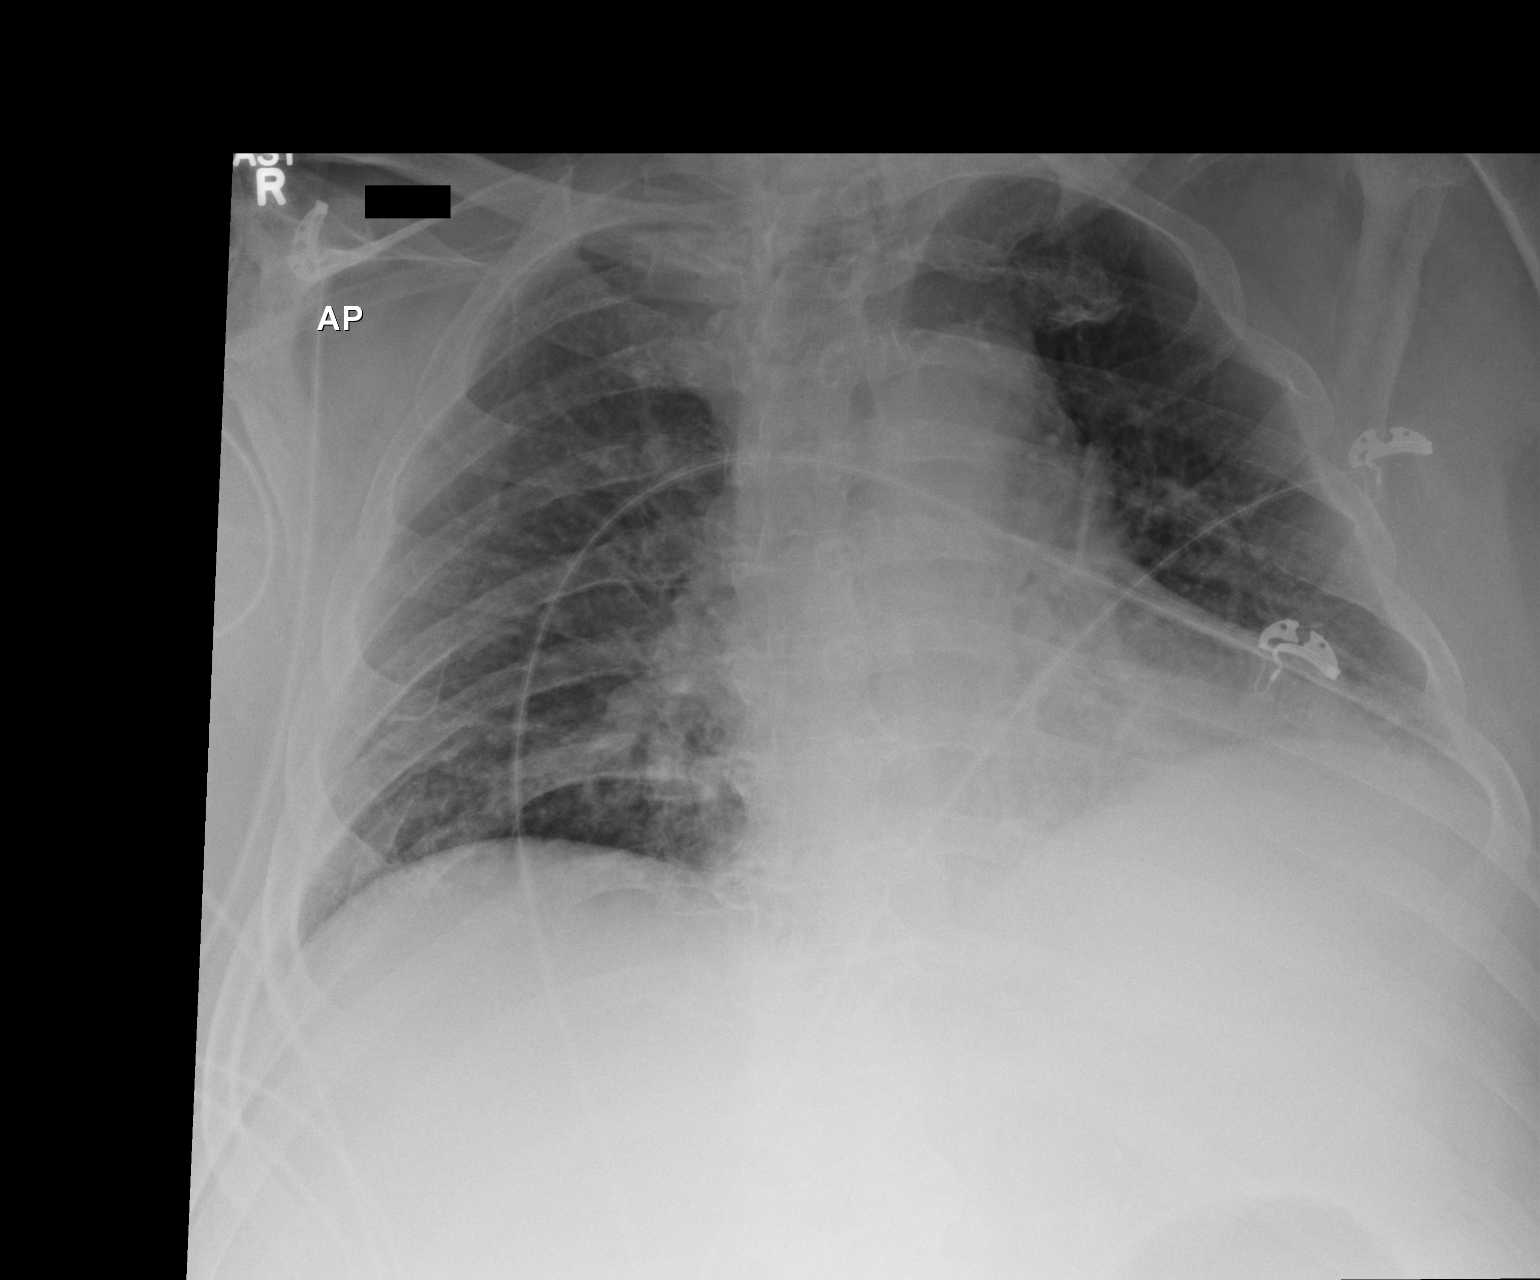

[1 of 1 positions shown; findings below may reference images not displayed]

FINDINGS: There is no edema or consolidation. Heart is mildly enlarged with
normal pulmonary vascularity. No adenopathy. No pneumothorax.
IMPRESSION: Mild cardiac enlargement.  No edema or consolidation.

## 2014-10-19 IMAGING — CT CT HEAD W/O CM
3 series · 17 of 30 positions shown, 19 images · non-contrast
Comparison: CT head 10/07/2013

CLINICAL DATA: Subdural hematoma post drainage

EXAM:
CT HEAD WITHOUT CONTRAST
TECHNIQUE: Contiguous axial images were obtained from the base of the skull
through the vertex without intravenous contrast.

[Series 3: head 5.0 h30s · axial · 0.43mm/px · z∈[-151,-71]mm · 4 of 33 slices shown (1 of 2)]
[im 6/33  brain]
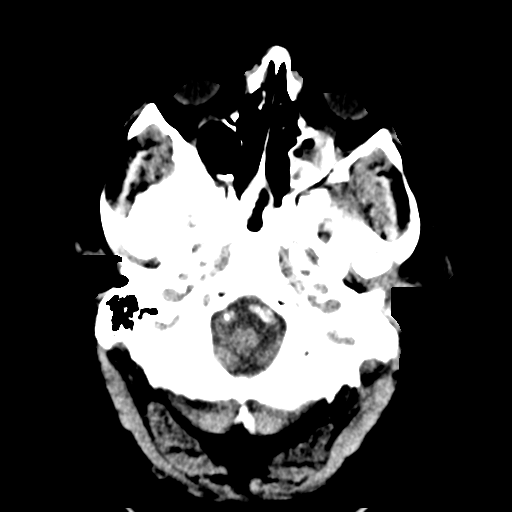
[im 11/33  brain]
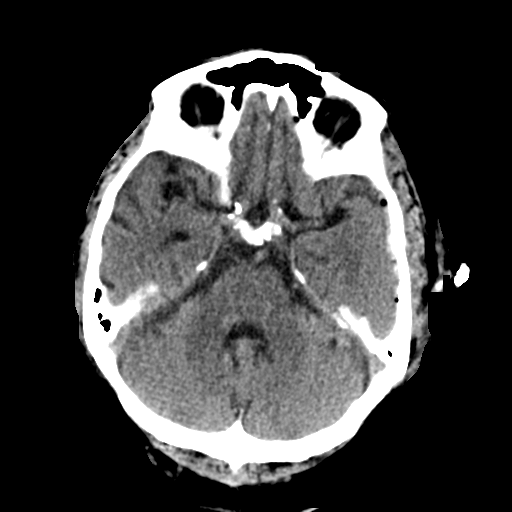
[im 17/33  brain]
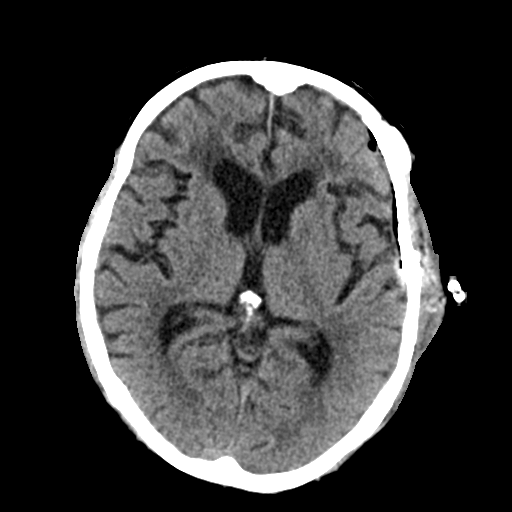
[im 22/33  brain]
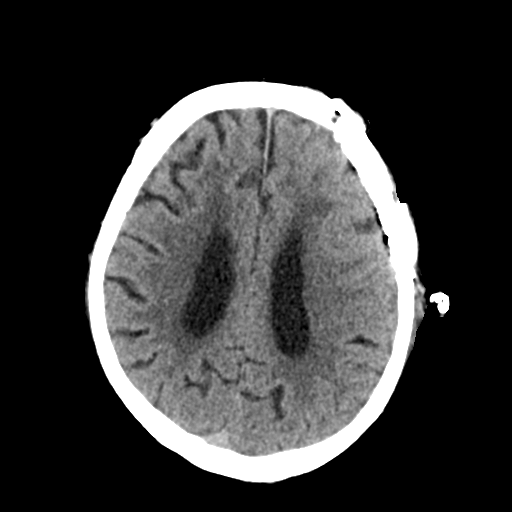

[Series 4: head 2.0 h70h · axial · 0.43mm/px · z∈[-166,-22]mm · 8 of 84 slices shown]
[im 6/84  brain]
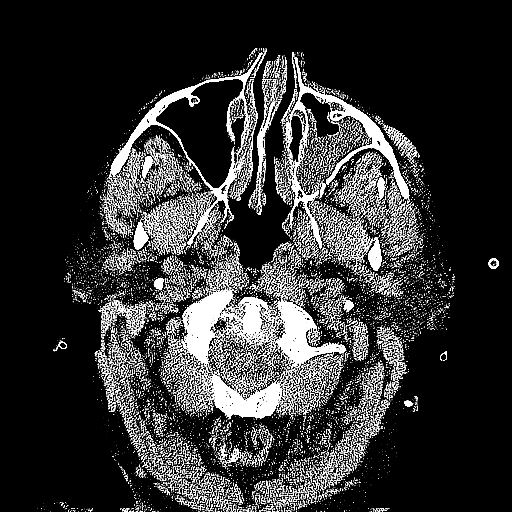
[im 17/84  brain]
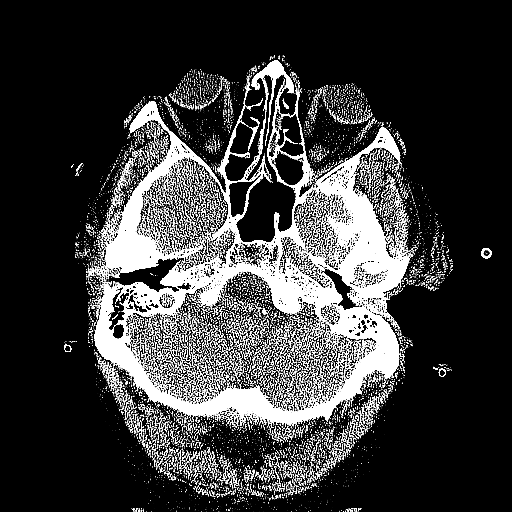
[im 28/84  brain]
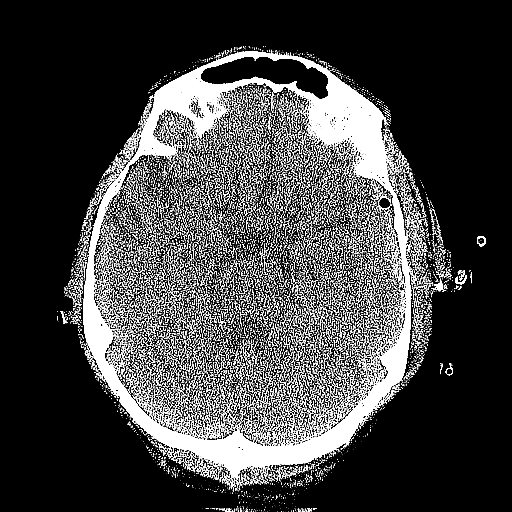
[im 39/84  brain]
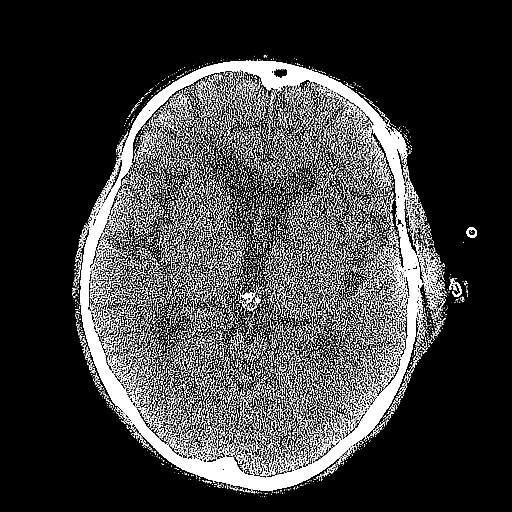
[im 45/84  brain]
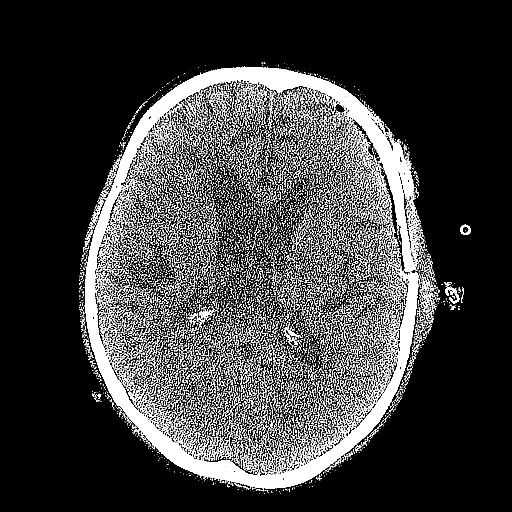
[im 56/84  brain]
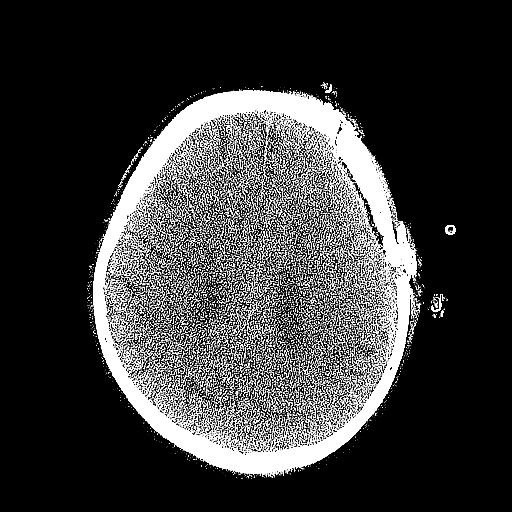
[im 67/84  brain]
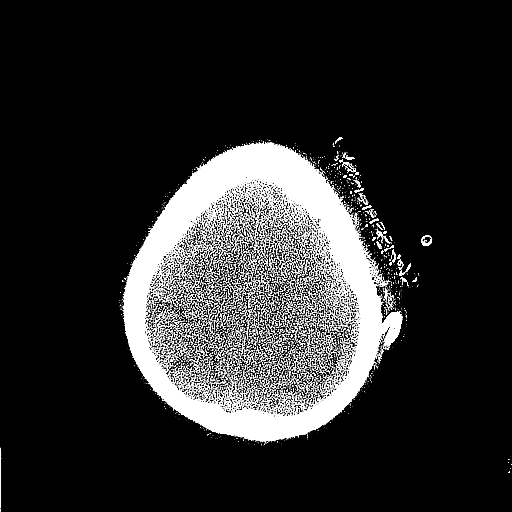
[im 78/84  brain]
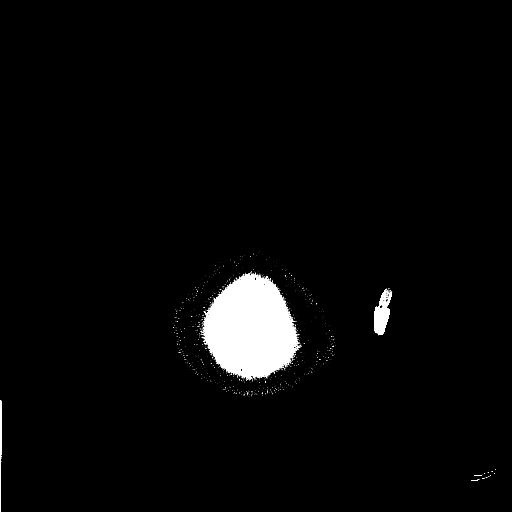

[Series 5: head 5.0 h30s · axial · 0.48mm/px · z∈[-152,-32]mm · 5 of 38 slices shown, 7 images (2 of 2)]
[im 7/38  brain]
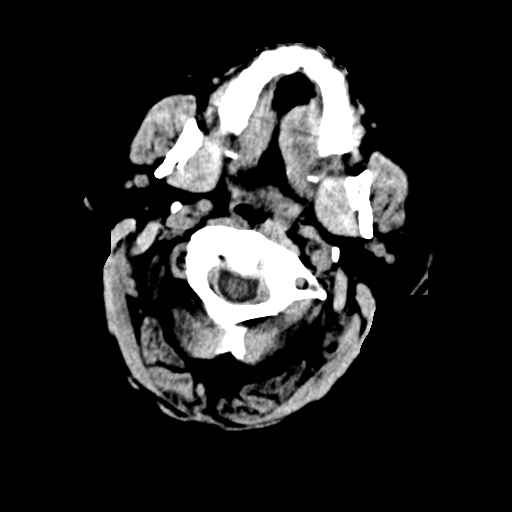
[im 7/38  bone]
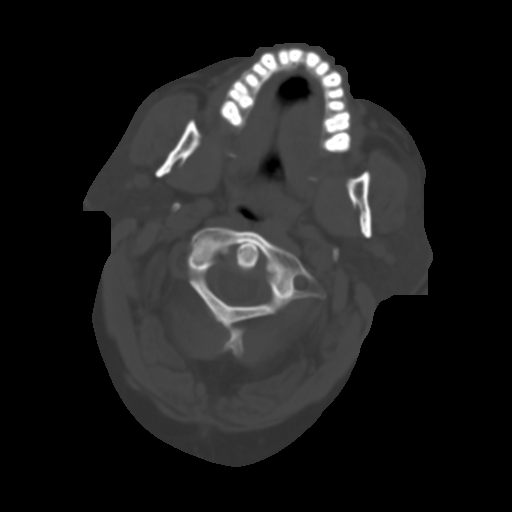
[im 13/38  brain]
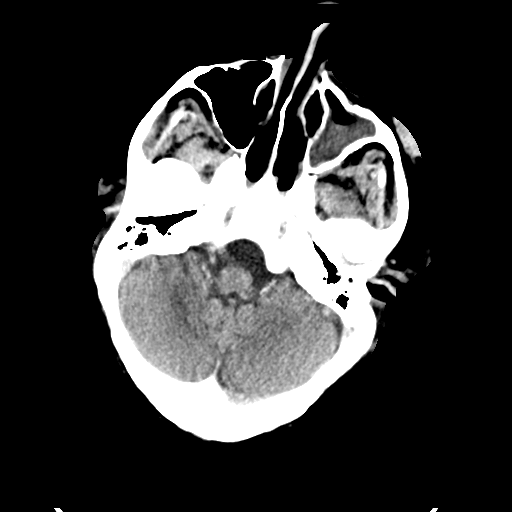
[im 19/38  brain]
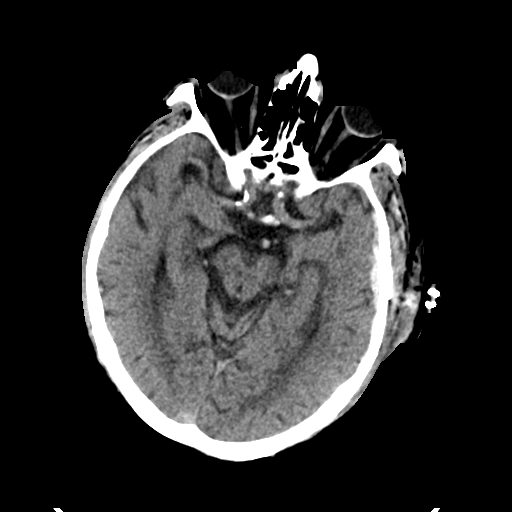
[im 25/38  brain]
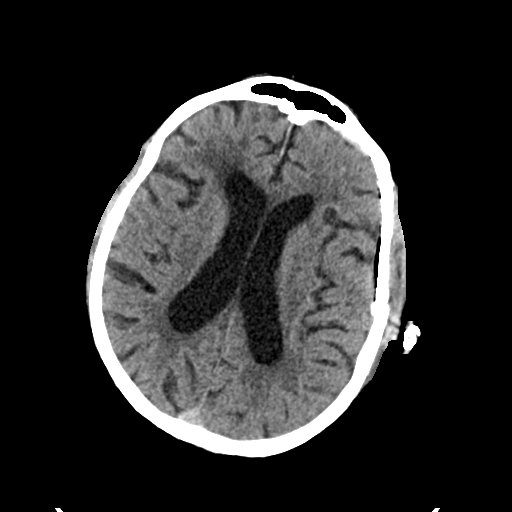
[im 31/38  brain]
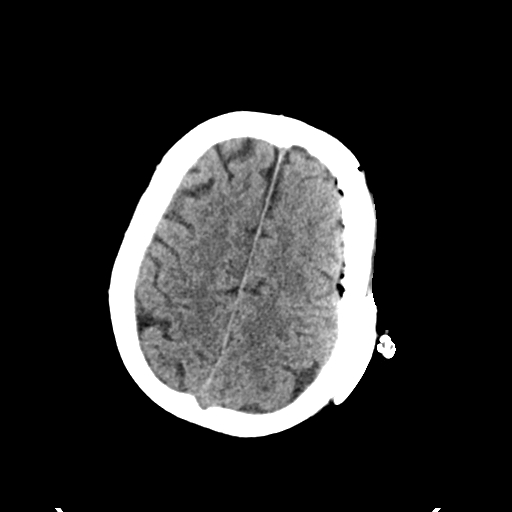
[im 31/38  bone]
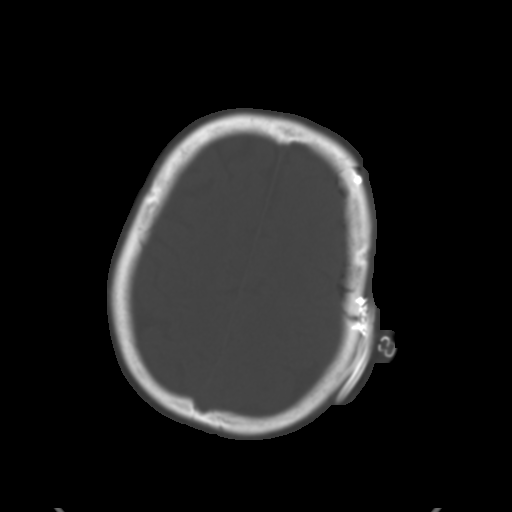

[17 of 30 positions shown; findings below may reference images not displayed]

FINDINGS: Interval craniotomy left frontal bone for subdural drainage. There
is a scalp drain on the left which does not extend into the
intracranial space.

There is satisfactory drainage of left-sided subdural hematoma.
Small amount of residual high density subdural blood is present as
well as a small amount of subdural gas on the left. Improvement in
mass effect on the left hemisphere. Mild midline shift 2 mm
unchanged.

Generalized atrophy. Chronic microvascular ischemic change in the
white matter.

Mucosal edema left maxillary sinus remains.
IMPRESSION: Satisfactory subdural drainage on the left. Small amount of residual
subdural blood and gas process on the left. Improvement in mass
effect.

## 2014-10-20 IMAGING — CR DG CHEST 1V PORT
1 series · 1 of 1 positions shown · non-contrast
Comparison: DG CHEST 1V PORT dated 10/08/2013

CLINICAL DATA: Respiratory failure

EXAM:
PORTABLE CHEST - 1 VIEW

[AP]
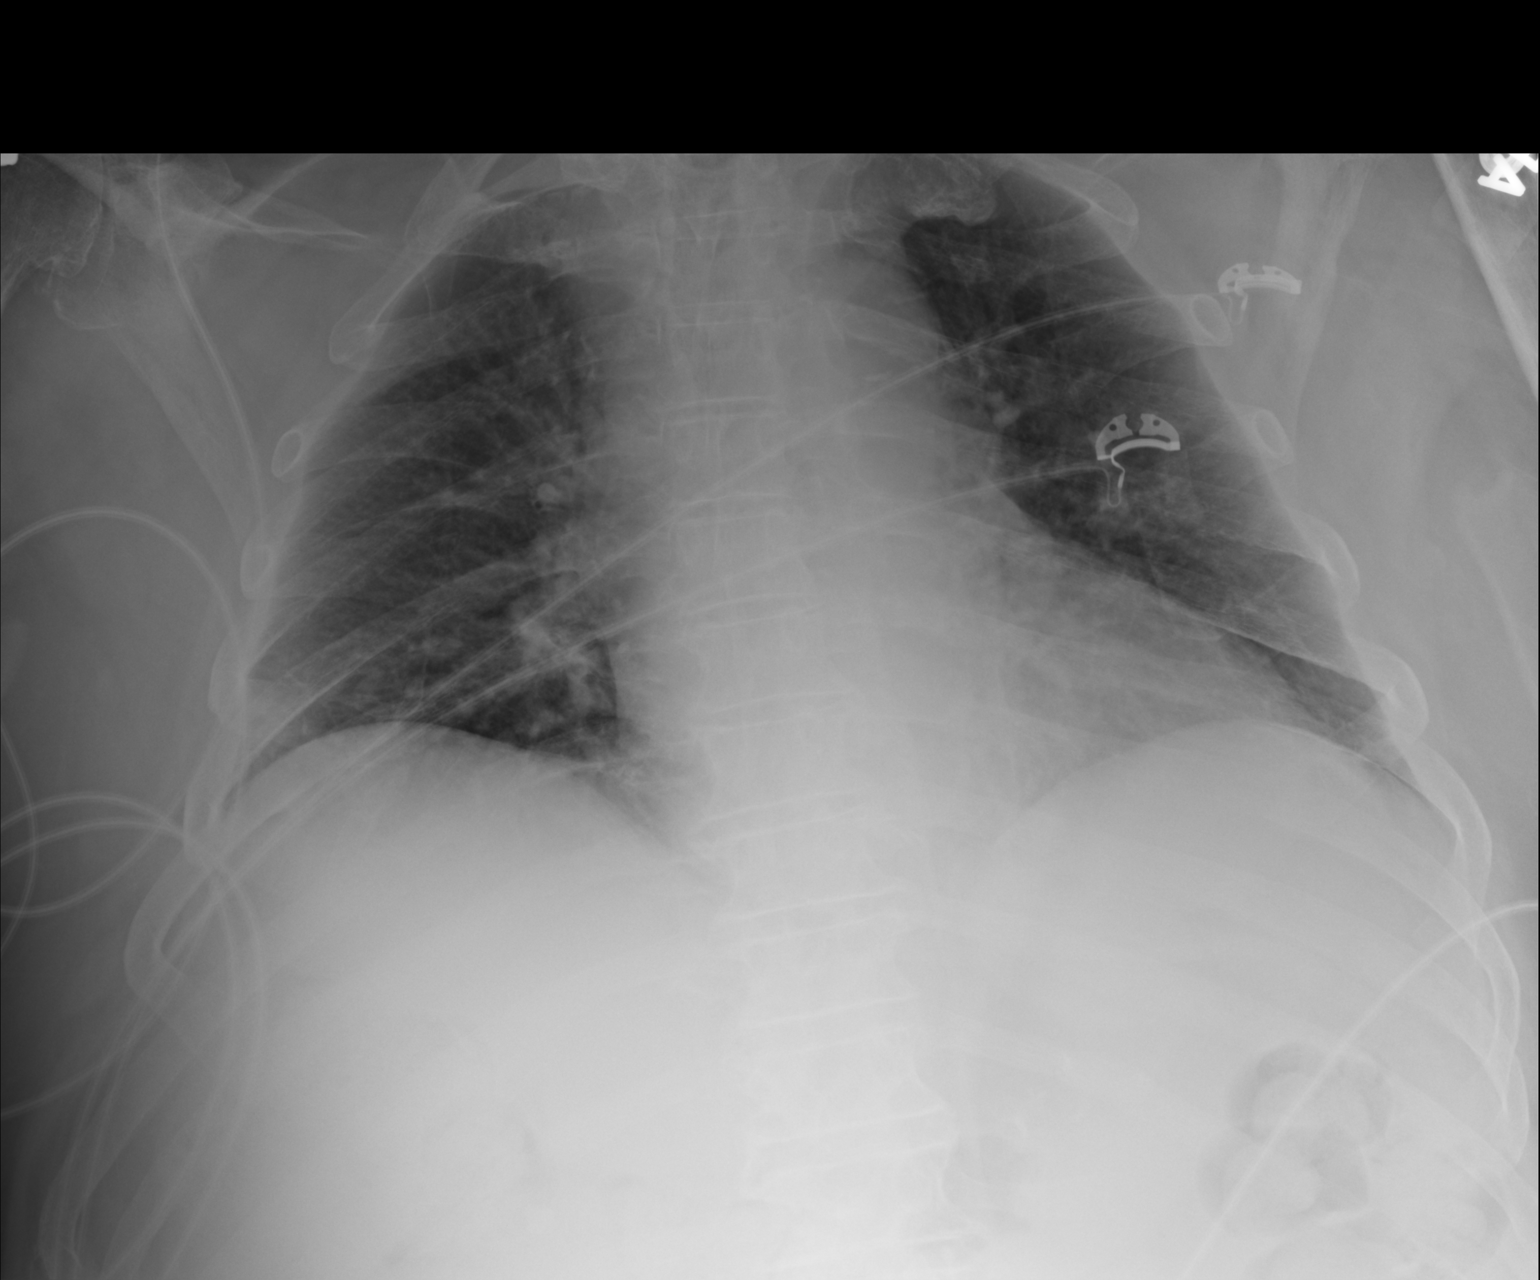

[1 of 1 positions shown; findings below may reference images not displayed]

FINDINGS: Low lung volumes. The cardiac silhouette is enlarged. Both lungs are
clear. The visualized skeletal structures are unremarkable.
IMPRESSION: No active disease.

## 2014-11-29 ENCOUNTER — Telehealth: Payer: Self-pay | Admitting: Internal Medicine

## 2014-11-29 NOTE — Telephone Encounter (Signed)
Spoke with pt's wife, states that [facility] told them that pt was eligible for a new machine if he has an ov and a [employer] manager.  Pt's wife is wanting to know if we can just place an order since pt is difficult to transport.  I advised pt that due to medicare guidelines we would have to see pt in office before new cpap order can be placed.  Pt scheduled for 5/25 at 11:15 per Katie.  Nothing further needed.

## 2014-12-14 ENCOUNTER — Encounter: Payer: Self-pay | Admitting: Internal Medicine

## 2014-12-14 ENCOUNTER — Ambulatory Visit (INDEPENDENT_AMBULATORY_CARE_PROVIDER_SITE_OTHER): Payer: Medicare Other | Admitting: Internal Medicine

## 2014-12-14 VITALS — BP 130/76 | HR 75 | Wt 220.0 lb

## 2014-12-14 DIAGNOSIS — J42 Unspecified chronic bronchitis: Secondary | ICD-10-CM

## 2014-12-14 DIAGNOSIS — G4733 Obstructive sleep apnea (adult) (pediatric): Secondary | ICD-10-CM | POA: Diagnosis not present

## 2014-12-14 MED ORDER — FLUTTER DEVI: Status: AC

## 2014-12-14 NOTE — Patient Instructions (Signed)
Order- DME [facility]- replacement CPAP machine  17 cwp, mask of choice, humidifier, supplies    Dx OSA  Order- script printed for Flutter device to help clear chest secretions            Try to blow through 4 times per set, 3 sets per day when needed            Dx Chronic bronchitis, retained secretions

## 2014-12-14 NOTE — Assessment & Plan Note (Signed)
Change over the past year and a half in cough which seems nonproductive and localized to the trachea. I suspect this is retained secretions he is just not strong enough to cough out. I do not hear rhonchi or wheeze in the lung periphery  Plan-try flutter device for better pulmonary toilet

## 2014-12-14 NOTE — Progress Notes (Signed)
Subjective:    Patient ID: Joshua Glass, male    DOB: 26-Aug-1932, 81 y.o.   MRN: [id number]  HPI 03/29/11- 81 year old male former smoker followed for obstructive sleep apnea, allergic rhinitis, complicated by DM, HBP. Last here -03/30/10- For the last 4-5 months he has felt no energy. He asks if this is age. He uses CPAP all night every night, and sleep quality feels the same to him, but doesn't know if he snores through. Avoids caffeine for heart rhythm. He is no more likely to doze off during the day that he was at last visit when he thought he was going well..  Denies heart or known thyroid problems. To see his PCP, Dr Henrene Pastor, in October.  Flu vax talk.   03/30/12- 81 year old male former smoker followed for obstructive sleep apnea, allergic rhinitis, complicated by DM, HBP.  1 yr f/u--states he is not sleeping very well at night, wearing CPAP machine approx 7 hrs,  denies any other symptoms CPAP 17/ [facility] nasal pillows mask. Pressure is comfortable. He is told that sometimes he snores through the mask, but nasal pillows and a higher pressure without more leak. Some daytime sleepiness he blames on medication and treats with naps Since last here fell > SDH  03/31/13-  81 year old male former smoker followed for obstructive sleep apnea, allergic rhinitis, complicated by DM, HBP. FOLLOWS FOR:  Wear CPAP 17/ / Advanced 6 hours per night.  Discuss new mask and possible machine.  Wife concned he is not really sleeping waking up not well rested, would like to know could he be tested Never got good mask fit. Stays sleepy. Restless insomnia, not uncomfortable. No longer drives. Tends to fall backwards.  05/18/13-  81 year old male former smoker followed for obstructive sleep apnea, allergic rhinitis, complicated by DM, HBP.  Wife here    Had flu vaccine FOLLOWS FOR: Wears CPAP 17/ [facility] every night for about 6-7 hours; pressure working well for patient. DME is [facility]. Download suggests 12 would be adequate,  but he is comfortable staying at 9 for now. Frequent nocturia. Diagnosed with Parkinson's disease/Dr. Jannifer Franklin.  05/18/14- 81 year old male former smoker followed for obstructive sleep apnea, allergic rhinitis, complicated by DM, HBP, Parkinsons/ subdural.  Wife here    Had flu vaccine FOLLOW FOR: OSA; wears CPAP 17/ [facility] every night; approximately 8 hours nightly; dry cough Stop going to neurologist "no help and too much trouble", walk with a walker with support. Dry cough, worse during the daytime. Treating with "vitamin O"-"electrified seawater with minerals, that they buy online CXR - 01/06/14 IMPRESSION:  No active disease.  Electronically Signed  By: Shon Hale M.D.  On: 01/06/2014 16:27  12/14/14-81 year old male former smoker followed for obstructive sleep apnea, allergic rhinitis, complicated by DM, HBP, Parkinsons/ subdural.  Wife here    Had flu vaccine CPAP 17/ [facility] Wife is caring for him pretty much by herself with some morning assistance as he declines with a neuromuscular disease which might be Parkinson's. Had brain surgery. He can walk some in the home with a walker but is no longer very conversational. Doing well with CPAP and told machine burned out. They need for placement machine. Compliance and control have been good. He also really need a hospital bed to allow wife to assist him with position changes. She is not strong enough to do lifting. He has Been upper chest rattle/cough for a year and a half without progression.  Review of Systems- see HPI Constitutional:   No-  weight loss, night sweats, fevers, chills,  + fatigue, lassitude. HEENT:   No-  headaches, difficulty swallowing, tooth/dental problems, sore throat,       No-  sneezing, itching, ear ache, nasal congestion, post nasal drip,  CV:  No-   chest pain, orthopnea, PND, swelling in lower extremities, anasarca, dizziness, palpitations Resp: No-   shortness of breath with exertion or at rest.               No-   productive cough,  + non-productive cough,  No-  coughing up of blood.              No-   change in color of mucus.  No- wheezing.   Skin: No-   rash or lesions. GI:  No-   heartburn, indigestion, abdominal pain, nausea, vomiting,  GU:  MS:  Joint pain,  Neuro- profound weakness and tremor Psych:  No- change in mood or affect. No depression or anxiety.  No memory loss.    Objective:   Physical Exam General- Alert, Oriented, Affect-appropriate, Distress- none acute               Obese, passive, + wheelchair Skin- rash-none, lesions- none, excoriation- none Lymphadenopathy- none Head- atraumatic            Eyes- Gross vision intact, PERRLA, conjunctivae clear secretions            Ears- Hearing, canals normal            Nose- Clear, No- Septal dev, mucus, polyps, erosion, perforation             Throat- Mallampati III , mucosa clear , drainage- none, tonsils- atrophic Neck- flexible , trachea midline, no stridor , thyroid nl, carotid no bruits Chest - symmetrical excursion , unlabored           Heart/CV- RRR , no murmur , no gallop  , no rub, nl s1 s2                           - JVD- none , edema+ trace, stasis changes- none, varices- none           Lung- clear to P&A, wheeze- none, cough- none , dullness-none, rub- none           Chest wall-  Abd-  Br/ Gen/ Rectal- Not done, not indicated Extrem- cyanosis- none, clubbing, none, atrophy- none, strength- nl,                  +wheelchair with restraint strap Neuro- + flat facial expression, +resting tremor right hand    Assessment & Plan:

## 2014-12-14 NOTE — Assessment & Plan Note (Signed)
Pressure and compliance seems very good. Old machine is burned out. Plan-replacement of his old CPAP machine, keeping pressure at 17.

## 2015-01-04 ENCOUNTER — Encounter: Payer: Self-pay | Admitting: Internal Medicine

## 2015-01-18 ENCOUNTER — Ambulatory Visit
Admission: RE | Admit: 2015-01-18 | Discharge: 2015-01-18 | Disposition: A | Discharge status: Home / Self Care | Payer: Medicare Other | Source: Ambulatory Visit | Attending: Internal Medicine | Admitting: Internal Medicine

## 2015-01-18 ENCOUNTER — Other Ambulatory Visit: Payer: Self-pay | Admitting: Internal Medicine

## 2015-01-18 DIAGNOSIS — R059 Cough, unspecified: Secondary | ICD-10-CM

## 2015-01-18 DIAGNOSIS — R05 Cough: Secondary | ICD-10-CM

## 2015-05-19 ENCOUNTER — Ambulatory Visit: Payer: Medicare Other | Admitting: Internal Medicine

## 2016-07-22 DEATH — deceased
